# Patient Record
Sex: Male | Born: 1965 | Race: White | Hispanic: No | Marital: Married | Smoking: Former smoker
Health system: Southern US, Community
[De-identification: ages and names within clinical notes are randomized; demographics above are authoritative.]

## PROBLEM LIST (undated history)

## (undated) DIAGNOSIS — E119 Type 2 diabetes mellitus without complications: Secondary | ICD-10-CM

## (undated) DIAGNOSIS — G4730 Sleep apnea, unspecified: Secondary | ICD-10-CM

## (undated) DIAGNOSIS — E7800 Pure hypercholesterolemia, unspecified: Secondary | ICD-10-CM

## (undated) DIAGNOSIS — I499 Cardiac arrhythmia, unspecified: Secondary | ICD-10-CM

## (undated) DIAGNOSIS — K219 Gastro-esophageal reflux disease without esophagitis: Secondary | ICD-10-CM

## (undated) DIAGNOSIS — E039 Hypothyroidism, unspecified: Secondary | ICD-10-CM

## (undated) DIAGNOSIS — F419 Anxiety disorder, unspecified: Secondary | ICD-10-CM

## (undated) DIAGNOSIS — Z87442 Personal history of urinary calculi: Secondary | ICD-10-CM

## (undated) HISTORY — PX: HERNIA REPAIR: SHX51

## (undated) HISTORY — PX: COLONOSCOPY: SHX174

---

## 2007-10-19 ENCOUNTER — Encounter: Payer: Self-pay | Admitting: Pulmonary Disease

## 2009-09-24 ENCOUNTER — Emergency Department (HOSPITAL_BASED_OUTPATIENT_CLINIC_OR_DEPARTMENT_OTHER): Admission: EM | Admit: 2009-09-24 | Discharge: 2009-09-24 | Payer: Self-pay | Admitting: Emergency Medicine

## 2009-09-24 ENCOUNTER — Ambulatory Visit: Payer: Self-pay | Admitting: Diagnostic Radiology

## 2010-01-02 ENCOUNTER — Ambulatory Visit: Payer: Self-pay | Admitting: Internal Medicine

## 2010-01-02 DIAGNOSIS — R5381 Other malaise: Secondary | ICD-10-CM | POA: Insufficient documentation

## 2010-01-02 DIAGNOSIS — R5383 Other fatigue: Secondary | ICD-10-CM

## 2010-01-08 ENCOUNTER — Telehealth (INDEPENDENT_AMBULATORY_CARE_PROVIDER_SITE_OTHER): Payer: Self-pay | Admitting: *Deleted

## 2010-01-08 LAB — CONVERTED CEMR LAB
Albumin: 3.9 g/dL (ref 3.5–5.2)
Alkaline Phosphatase: 66 units/L (ref 39–117)
BUN: 13 mg/dL (ref 6–23)
Basophils Relative: 0.4 % (ref 0.0–3.0)
Bilirubin, Direct: 0.1 mg/dL (ref 0.0–0.3)
Chloride: 106 meq/L (ref 96–112)
Creatinine, Ser: 1 mg/dL (ref 0.4–1.5)
Direct LDL: 95.1 mg/dL
Hemoglobin: 15.6 g/dL (ref 13.0–17.0)
Lymphocytes Relative: 29.7 % (ref 12.0–46.0)
Neutrophils Relative %: 59.1 % (ref 43.0–77.0)
Platelets: 175 10*3/uL (ref 150.0–400.0)
Sodium: 140 meq/L (ref 135–145)
TSH: 0.95 microintl units/mL (ref 0.35–5.50)
Total Bilirubin: 0.7 mg/dL (ref 0.3–1.2)
Total CHOL/HDL Ratio: 6
Total Protein: 6.8 g/dL (ref 6.0–8.3)
Triglycerides: 298 mg/dL — ABNORMAL HIGH (ref 0.0–149.0)
VLDL: 59.6 mg/dL — ABNORMAL HIGH (ref 0.0–40.0)

## 2010-02-06 ENCOUNTER — Ambulatory Visit: Payer: Self-pay | Admitting: Pulmonary Disease

## 2010-02-06 DIAGNOSIS — R519 Headache, unspecified: Secondary | ICD-10-CM | POA: Insufficient documentation

## 2010-02-06 DIAGNOSIS — G4733 Obstructive sleep apnea (adult) (pediatric): Secondary | ICD-10-CM | POA: Insufficient documentation

## 2010-02-06 DIAGNOSIS — R51 Headache: Secondary | ICD-10-CM | POA: Insufficient documentation

## 2010-02-17 ENCOUNTER — Telehealth (INDEPENDENT_AMBULATORY_CARE_PROVIDER_SITE_OTHER): Payer: Self-pay | Admitting: *Deleted

## 2010-03-03 ENCOUNTER — Telehealth: Payer: Self-pay | Admitting: Pulmonary Disease

## 2010-03-05 ENCOUNTER — Encounter: Payer: Self-pay | Admitting: Pulmonary Disease

## 2010-04-13 ENCOUNTER — Telehealth: Payer: Self-pay | Admitting: Pulmonary Disease

## 2010-07-14 NOTE — Progress Notes (Signed)
Summary: FYI re: sleep study  Phone Note Call from Patient   Caller: Spouse Call For: clance Summary of Call: FYI: info requested by kc. pt had sleep study at [facility] [phone]. pt's spouse jody santos cell # [phone] (caller also left a msg re her father- Sande Rives DOB 02/04/46) Initial call taken by: Tivis Ringer, CNA,  February 17, 2010 11:37 AM  Follow-up for Phone Call        Spoke with pt's spouse and notified that we have faxed records release that pt signed over to [facility].  Spouse verbalized understanding. Follow-up by: Vernie Murders,  February 17, 2010 2:17 PM

## 2010-07-14 NOTE — Progress Notes (Signed)
Summary: Request for labs results  Phone Note Call from Patient Call back at Home Phone [phone] Call back at Work Phone [phone]   Caller: Patient Summary of Call: patient called to check on results of his lab work---please call him as soon as possible Initial call taken by: Jerolyn Shin,  January 08, 2010 12:20 PM  Follow-up for Phone Call        I left message on cell VM (got number from reg screen) informing patient labs not signed off on yet, I will call when labs addressed.  I called patient on home number and left message with male to have patient return call when avaliable Chrae Marlynn Perking CMA,  January 08, 2010 2:10 PM   patient called back - exlpained dr Claris Gladden reviewed lab yet - wil lcall patient when reviewed .Marland KitchenOkey Regal [place]  January 08, 2010 3:32 PM   Additional Follow-up for Phone Call Additional follow up Details #1::        see Appendum Additional Follow-up by: Marga Melnick MD,  January 08, 2010 5:34 PM    Additional Follow-up for Phone Call Additional follow up Details #2::    Left message on machine with results below (Cell Number-see reg comment)  All labs are good EXCEPT Triglycerides (TG)  goal = < 150. TG > 150 is a pre Diabetes pattern. Read ALL food & drink labels looking for grams of sugar from High Fructose Corn Syrup (HFCS)  &  as #1,2 or 33 on label. Consume LESS THAN 40 grams of HFCS sugar / day . The body converts HFCS into TG & stores it as fat inside belly & in liver.  as TG rise the HDL or GOOD cholesterol goes down & the VLDL (BAD ) cholesterol rises.Check fasting lipids after 3-4 months of restricting HFCS sugar.(Code: 272.2). Hopp  Patient to call if questions or concerns./Chrae [facility] CMA  January 08, 2010 5:36 PM

## 2010-07-14 NOTE — Assessment & Plan Note (Signed)
Summary: new to est/cpx//kn   Vital Signs:  Patient profile:   45 year old male Height:      71.25 inches Weight:      254.6 pounds BMI:     35.39 Temp:     97.7 degrees F oral Pulse rate:   88 / minute Resp:     16 per minute BP sitting:   122 / 84  (left arm) Cuff size:   large  Vitals Entered By: Shonna Chock CMA (January 02, 2010 11:54 AM) CC: New patient establish: CPX with fasting labs , Fatigue   CC:  New patient establish: CPX with fasting labs  and Fatigue.  History of Present Illness: Stephen Franklin is here for a physical; he questions low testosterone due to fatigue for 12 months..  The patient reports persistent fatigue, fatigue  even without physical exertion,  primarily physical fatigue.  The patient denies fever, night sweats, weight loss, exertional chest pain, dyspnea, cough, and hemoptysis.  Other symptoms include occasional  leg swelling,  intermittent cervical adenopathy, some  snoring, and  occasional daytime sleepiness. Sleep Study in 2007 in [place]; CPAP not used as equipment needs updated &  mask is uncomfortable. The patient denies the following symptoms: orthopnea, PND, melena, and skin changes.  The patient denies feeling depressed, altered appetite, and poor sleep  even off CPAP.    Preventive Screening-Counseling & Management  Alcohol-Tobacco     Smoking Status: current  Caffeine-Diet-Exercise     Does Patient Exercise: no  Current Medications (verified): 1)  None  Allergies (verified): No Known Drug Allergies  Past History:  Past Medical History: Sleep Apnea  Past Surgical History: Inguinal herniorrhaphy  Family History: Father: HTN, CM,lung cancer Mother: DM Siblings: bro: fibromyalgia, died post aspiration in context of accidental OD  Social History: Occupation: Teacher, English as a foreign language Single Current Smoker:3 cig / day Alcohol use-yes: beer socially Regular exercise-no Smoking Status:  current Does Patient Exercise:  no  Review of  Systems General:  Denies chills and sweats. Eyes:  Denies blurring, double vision, and vision loss-both eyes. ENT:  Denies difficulty swallowing and hoarseness. Resp:  Denies cough and sputum productive; Occasional am headache. GI:  Complains of indigestion. GU:  Denies decreased libido, discharge, erectile dysfunction, and hematuria. MS:  Denies joint pain, low back pain, mid back pain, muscle weakness, and thoracic pain. Derm:  Denies changes in nail beds, dryness, and hair loss. Neuro:  Complains of numbness and tingling; N&T R hand. Endo:  Denies cold intolerance, excessive hunger, excessive thirst, excessive urination, and heat intolerance. Heme:  Denies abnormal bruising and bleeding. Allergy:  Denies itching eyes and sneezing.  Physical Exam  General:  in no acute distress; alert,appropriate and cooperative throughout examination Head:  Normocephalic and atraumatic without obvious abnormalities. No apparent alopecia ; beard & moustache Eyes:  No corneal or conjunctival inflammation noted.  Perrla. Funduscopic exam benign, without hemorrhages, exudates or papilledema.  Ears:  External ear exam shows no significant lesions or deformities.  Otoscopic examination reveals clear canals, tympanic membranes are intact bilaterally without bulging, retraction, inflammation or discharge. Hearing is grossly normal bilaterally. Nose:  External nasal examination shows no deformity or inflammation. Nasal mucosa are pink and moist without lesions or exudates. Mouth:  Oral mucosa and oropharynx without lesions or exudates.  Teeth in good repair. Neck:  No deformities, masses, or tenderness noted. Lungs:  Normal respiratory effort, chest expands symmetrically. Lungs are clear to auscultation, no crackles or wheezes. Heart:  Normal rate and  regular rhythm. S1 and S2 normal without gallop, murmur, click, rub . S4  Abdomen:  Bowel sounds positive,abdomen soft and non-tender without masses, organomegaly or  hernias noted. Rectal:  No external abnormalities noted. Normal sphincter tone. No rectal masses or tenderness. Genitalia:  Testes bilaterally descended without nodularity, tenderness or masses. No scrotal masses or lesions. No penis lesions or urethral discharge. L varicocele.   Prostate:  Prostate gland firm and smooth, no enlargement, nodularity, tenderness, mass, asymmetry or induration. Msk:  No deformity or scoliosis noted of thoracic or lumbar spine.   Pulses:  R and L carotid,radial,dorsalis pedis and posterior tibial pulses are full and equal bilaterally Extremities:  No clubbing, cyanosis, edema, or deformity noted with normal full range of motion of all joints.   Neurologic:  alert & oriented X3 and DTRs symmetrical and normal.  + Tinel's R wrist Skin:  Intact without suspicious lesions or rashes. 2 tatooes Cervical Nodes:  No lymphadenopathy noted Axillary Nodes:  No palpable lymphadenopathy Inguinal Nodes:  No significant adenopathy Psych:  memory intact for recent and remote, normally interactive, and good eye contact.     Impression & Recommendations:  Problem # 1:  ROUTINE GENERAL MEDICAL EXAM@[email or web address]  CARE FACL (ICD-V70.0)  Orders: EKG w/ Interpretation (93000) Venipuncture (40981) TLB-Lipid Panel (80061-LIPID) TLB-BMP (Basic Metabolic Panel-BMET) (80048-METABOL) TLB-CBC Platelet - w/Differential (85025-CBCD) TLB-Hepatic/Liver Function Pnl (80076-HEPATIC) TLB-TSH (Thyroid Stimulating Hormone) (84443-TSH) Sleep Disorder Referral (Sleep Disorder)  Problem # 2:  FATIGUE (ICD-780.79)  Problem # 3:  SLEEP APNEA (ICD-780.57)  Orders: Sleep Disorder Referral (Sleep Disorder)  Patient Instructions: 1)  Take CPAP equipment to Sleep Specialist appt   Immunization History:  Tetanus/Td Immunization History:    Tetanus/Td:  historical (06/14/2006)   Appended Document: new to est/cpx//kn    Clinical Lists Changes  Orders: Added new Service order of Specimen  Handling (19147) - Signed

## 2010-07-14 NOTE — Assessment & Plan Note (Signed)
Summary: self referral for management of osa   Copy to:  self Primary Stephen Franklin/Referring Stephen Franklin:  Stephen Melnick MD  CC:  Sleep Consult.  History of Present Illness: The pt is a 45y/o male who comes in today as a self referral for management of osa.  He apparently had a sleep study in [place] in 2007, and told he had moderate osa.  He was treated with cpap and felt it helped, but could not keep the device due to lack of insurance.  He used for approx one year before turning it back in.  The pt has gained 35 pounds since his initial study in 2007, and is continuing to have loud snoring and an abnormal breathing pattern during sleep.  He goes to bed around 10pm, and arises at 6am to start his day.  He frequently has am headaches, and is not rested upon arising.  He is very busy at his work, and feels he does not sit still long enough to experience sleepiness.  He can doze at lunch however.  He will fall asleep in the evenings with tv or movies, but denies any sleepiness with driving.  His epworth score today is 11.  Current Medications (verified): 1)  No Prescription Medications  Allergies (verified): No Known Drug Allergies  Past History:  Past Medical History:  HEADACHE, CHRONIC (ICD-784.0) OBSTRUCTIVE SLEEP APNEA (ICD-327.23) FATIGUE (ICD-780.79)    Past Surgical History: Inguinal hernia repair as a child  Family History: Reviewed history from 01/02/2010 and no changes required. Father: HTN, CM,lung cancer Mother: DM Siblings: bro: fibromyalgia, died post aspiration in context of accidental OD  Social History: Reviewed history from 01/02/2010 and no changes required. Occupation: Engineer, manufacturing Current Smoker: started at age 74.  1/2 pack per week Alcohol use-yes: beer socially Regular exercise-no  Review of Systems       The patient complains of indigestion, weight change, headaches, nasal congestion/difficulty breathing through nose, and anxiety.  The  patient denies shortness of breath with activity, shortness of breath at rest, productive cough, non-productive cough, coughing up blood, chest pain, irregular heartbeats, acid heartburn, loss of appetite, abdominal pain, difficulty swallowing, sore throat, tooth/dental problems, sneezing, itching, ear ache, depression, hand/feet swelling, joint stiffness or pain, rash, change in color of mucus, and fever.    Vital Signs:  Patient profile:   45 year old male Height:      71.25 inches Weight:      256.25 pounds BMI:     35.62 O2 Sat:      99 % on Room air Temp:     97.6 degrees F oral Pulse rate:   87 / minute BP sitting:   120 / 80  (left arm) Cuff size:   large  Vitals Entered By: Arman Filter LPN (February 06, 2010 9:35 AM)  O2 Flow:  Room air CC: Sleep Consult Comments Medications reviewed with patient Arman Filter LPN  February 06, 2010 9:35 AM    Physical Exam  General:  obese male in nad Eyes:  PERRLA and EOMI.   Nose:  narrowed bilat, but deviated septum to left Mouth:  moderate elongation of soft palate and uvula Neck:  no jvd, tmg, LN Lungs:  clear to auscultation Heart:  rrr, no mrg Abdomen:  soft and nontender, bs+ Extremities:  no edema noted, pulses intact distally no cyanosis  Neurologic:  alert, but somewhat sleepy, moves all 4.   Impression & Recommendations:  Problem # 1:  OBSTRUCTIVE SLEEP APNEA (ICD-327.23)  the pt has a h/o osa diagnosed in the past, and is having significant symptoms currently which are interferring with his QOL.  We will need to get a copy of his sleep study, and start him back on cpap.  He has responded well to this in the past, and I have also encouraged him to work aggressively on weight loss.  I have had a long discussion with the pt about sleep apnea, including its impact on QOL and CV health.    Medications Added to Medication List This Visit: 1)  No Prescription Medications   Other Orders: New Patient Level IV  (16109)  Patient Instructions: 1)  will start on cpap once your sleep study results are available.  Let us know if you are able to locate, and we will try as well. 2)  work on weight loss 3)  followup with me in 5 weeks after starting cpap.  Appended Document: self referral for management of osa megan, have you seen this pt's sleep study from [facility]??  Appended Document: self referral for management of osa see phone note from 02/17/2010.  I was waiting for him to call me back with the name of the doc or place he had sleep study done at because at the time of his visit, pt didn't know. I faxed over a release of info to the place he had sleep study done at and now just waiting for results.

## 2010-07-14 NOTE — Progress Notes (Signed)
Summary: new cpap req -   Phone Note Call from Patient   Caller: fiance jodi Call For: Ramello Cordial Summary of Call: pt needs a new cpap. what needs to be done now to "expedite this so pt can get some sleep? jodi # [phone] Initial call taken by: Tivis Ringer, CNA,  March 03, 2010 10:52 AM  Follow-up for Phone Call        we still have not seen sleep study on pt from [facility] in [place].  Therefore called [facility] again at [phone] and spoke with Morrie Sheldon and requested sleep study be faxed to [place] asap.  Waiting on fax.  Aundra Millet Reynolds LPN  March 03, 2010 11:06 AM   [place] [facility] or [facility], have you seen the sleep study?  if not, i will call again. Follow-up by: Boone Master CNA/MA,  March 04, 2010 12:00 PM  Additional Follow-up for Phone Call Additional follow up Details #1::        No we still have not gotten it yet. It is no tin any of our paperwork Carver Fila  March 04, 2010 12:13 PM   called [facility] and spoke with [place].  requested sleep study again STAT as this is our second request.  gave her the triage fax #.  will await fax  Boone Master CNA/MA  March 04, 2010 12:38 PM     Additional Follow-up for Phone Call Additional follow up Details #2::    sleep study received and handed to Mindy.  called spoke with Lennox Laity, informed her that we had been waiting on the sleep study for [facility]; that it is now received.  Lennox Laity verbalized her understanding. Boone Master CNA/MA  March 04, 2010 12:42 PM    I placed the sleep study in kc's very important look at folder for review. Carver Fila  March 04, 2010 1:25 PM   Additional Follow-up for Phone Call Additional follow up Details #3:: Details for Additional Follow-up Action Taken: pt had split night study in 2009 with moderate osa (ahi 22/hr).  will send an order to [facility] to set pt up with cpap.  needs to precert. since has been 2 yrs. Additional Follow-up by: Barbaraann Share MD,  March 04, 2010 5:35 PM  pt aware. Carron Curie CMA  March 05, 2010 9:49 AM

## 2010-07-14 NOTE — Progress Notes (Signed)
Summary: nos appt  Phone Note Call from Patient   Caller: [email or web address]  Call For: Angeni Chaudhuri Summary of Call: ATC pt to [facility] nos from 10/28 phone disconnected. Initial call taken by: Darletta Moll,  April 13, 2010 3:30 PM

## 2010-07-14 NOTE — Miscellaneous (Signed)
Summary: Order / [facility]  Order / [facility]   Imported By: Lennie Odor 03/12/2010 14:48:12  _____________________________________________________________________  External Attachment:    Type:   Image     Comment:   External Document

## 2010-09-02 LAB — URINALYSIS, ROUTINE W REFLEX MICROSCOPIC
Glucose, UA: NEGATIVE mg/dL
Ketones, ur: NEGATIVE mg/dL
Protein, ur: NEGATIVE mg/dL
Specific Gravity, Urine: 1.012 (ref 1.005–1.030)
pH: 7.5 (ref 5.0–8.0)

## 2010-09-02 LAB — POCT CARDIAC MARKERS
Myoglobin, poc: 62.4 ng/mL (ref 12–200)
Troponin i, poc: <0.05 ng/mL (ref 0.00–0.09)
Troponin i, poc: <0.05 ng/mL (ref 0.00–0.09)

## 2010-09-02 LAB — CBC
Hemoglobin: 15.7 g/dL (ref 13.0–17.0)
MCHC: 33.4 g/dL (ref 30.0–36.0)
WBC: 6.8 10*3/uL (ref 4.0–10.5)

## 2010-09-02 LAB — HEMOGLOBIN A1C: Hgb A1c MFr Bld: 5.9 % — ABNORMAL HIGH (ref <5.7)

## 2010-09-02 LAB — COMPREHENSIVE METABOLIC PANEL
Calcium: 8.8 mg/dL (ref 8.4–10.5)
Chloride: 106 mEq/L (ref 96–112)
Creatinine, Ser: 1 mg/dL (ref 0.4–1.5)
GFR calc non Af Amer: >60 mL/min (ref >60)
Glucose, Bld: 93 mg/dL (ref 70–99)
Potassium: 4.3 mEq/L (ref 3.5–5.1)
Sodium: 141 mEq/L (ref 135–145)
Total Protein: 7 g/dL (ref 6.0–8.3)

## 2010-09-02 LAB — GLUCOSE, CAPILLARY: Glucose-Capillary: 87 mg/dL (ref 70–99)

## 2010-09-02 LAB — DIFFERENTIAL
Basophils Absolute: 0 10*3/uL (ref 0.0–0.1)
Eosinophils Absolute: 0.1 10*3/uL (ref 0.0–0.7)
Monocytes Relative: 11 % (ref 3–12)

## 2010-11-30 ENCOUNTER — Emergency Department (HOSPITAL_BASED_OUTPATIENT_CLINIC_OR_DEPARTMENT_OTHER)
Admission: EM | Admit: 2010-11-30 | Discharge: 2010-11-30 | Disposition: A | Discharge status: Home / Self Care | Payer: Self-pay | Attending: Emergency Medicine | Admitting: Emergency Medicine

## 2010-11-30 DIAGNOSIS — R002 Palpitations: Secondary | ICD-10-CM | POA: Insufficient documentation

## 2010-11-30 LAB — CBC
HCT: 42 % (ref 39.0–52.0)
Hemoglobin: 14.9 g/dL (ref 13.0–17.0)
MCH: 32.7 pg (ref 26.0–34.0)
MCHC: 35.5 g/dL (ref 30.0–36.0)
MCV: 92.1 fL (ref 78.0–100.0)
WBC: 6.4 10*3/uL (ref 4.0–10.5)

## 2010-11-30 LAB — CK TOTAL AND CKMB (NOT AT ARMC)
Relative Index: 1.3 (ref 0.0–2.5)
Total CK: 202 U/L (ref 7–232)

## 2010-11-30 LAB — COMPREHENSIVE METABOLIC PANEL
Albumin: 3.8 g/dL (ref 3.5–5.2)
BUN: 11 mg/dL (ref 6–23)
GFR calc Af Amer: >60 mL/min (ref >60)
GFR calc non Af Amer: >60 mL/min (ref >60)
Total Bilirubin: 0.2 mg/dL — ABNORMAL LOW (ref 0.3–1.2)

## 2010-11-30 LAB — URINALYSIS, ROUTINE W REFLEX MICROSCOPIC
Glucose, UA: NEGATIVE mg/dL
Ketones, ur: NEGATIVE mg/dL
Protein, ur: NEGATIVE mg/dL
pH: 6 (ref 5.0–8.0)

## 2010-11-30 LAB — TROPONIN I: Troponin I: <0.3 ng/mL (ref <0.30)

## 2010-11-30 LAB — DIFFERENTIAL
Basophils Relative: 0 % (ref 0–1)
Eosinophils Relative: 1 % (ref 0–5)
Lymphs Abs: 1.4 10*3/uL (ref 0.7–4.0)
Monocytes Absolute: 0.5 10*3/uL (ref 0.1–1.0)
Neutro Abs: 4.5 10*3/uL (ref 1.7–7.7)

## 2010-12-11 ENCOUNTER — Emergency Department (HOSPITAL_BASED_OUTPATIENT_CLINIC_OR_DEPARTMENT_OTHER)
Admission: EM | Admit: 2010-12-11 | Discharge: 2010-12-11 | Disposition: A | Discharge status: Home / Self Care | Payer: Self-pay | Attending: Emergency Medicine | Admitting: Emergency Medicine

## 2010-12-11 ENCOUNTER — Emergency Department (INDEPENDENT_AMBULATORY_CARE_PROVIDER_SITE_OTHER): Payer: Self-pay

## 2010-12-11 DIAGNOSIS — N200 Calculus of kidney: Secondary | ICD-10-CM

## 2010-12-11 DIAGNOSIS — R109 Unspecified abdominal pain: Secondary | ICD-10-CM | POA: Insufficient documentation

## 2010-12-11 DIAGNOSIS — R160 Hepatomegaly, not elsewhere classified: Secondary | ICD-10-CM | POA: Insufficient documentation

## 2010-12-11 DIAGNOSIS — F17200 Nicotine dependence, unspecified, uncomplicated: Secondary | ICD-10-CM | POA: Insufficient documentation

## 2010-12-11 LAB — DIFFERENTIAL
Basophils Absolute: 0 10*3/uL (ref 0.0–0.1)
Basophils Relative: 0 % (ref 0–1)
Eosinophils Absolute: 0.1 10*3/uL (ref 0.0–0.7)
Lymphocytes Relative: 25 % (ref 12–46)
Lymphs Abs: 1.4 10*3/uL (ref 0.7–4.0)
Monocytes Absolute: 0.6 10*3/uL (ref 0.1–1.0)
Neutrophils Relative %: 63 % (ref 43–77)

## 2010-12-11 LAB — COMPREHENSIVE METABOLIC PANEL
ALT: 74 U/L — ABNORMAL HIGH (ref 0–53)
AST: 36 U/L (ref 0–37)
Albumin: 3.8 g/dL (ref 3.5–5.2)
Alkaline Phosphatase: 84 U/L (ref 39–117)
Calcium: 9.5 mg/dL (ref 8.4–10.5)
Chloride: 103 mEq/L (ref 96–112)
GFR calc Af Amer: >60 mL/min (ref >60)
GFR calc non Af Amer: >60 mL/min (ref >60)
Total Bilirubin: 0.3 mg/dL (ref 0.3–1.2)
Total Protein: 6.9 g/dL (ref 6.0–8.3)

## 2010-12-11 LAB — CBC
HCT: 43.8 % (ref 39.0–52.0)
MCHC: 34.9 g/dL (ref 30.0–36.0)
Platelets: 161 10*3/uL (ref 150–400)
RBC: 4.74 MIL/uL (ref 4.22–5.81)
WBC: 5.8 10*3/uL (ref 4.0–10.5)

## 2010-12-11 LAB — URINALYSIS, ROUTINE W REFLEX MICROSCOPIC
Bilirubin Urine: NEGATIVE
Ketones, ur: NEGATIVE mg/dL
Specific Gravity, Urine: 1.008 (ref 1.005–1.030)
pH: 7 (ref 5.0–8.0)

## 2011-05-04 ENCOUNTER — Emergency Department (INDEPENDENT_AMBULATORY_CARE_PROVIDER_SITE_OTHER): Payer: Medicaid Other

## 2011-05-04 ENCOUNTER — Emergency Department (HOSPITAL_BASED_OUTPATIENT_CLINIC_OR_DEPARTMENT_OTHER)
Admission: EM | Admit: 2011-05-04 | Discharge: 2011-05-04 | Disposition: A | Discharge status: Home / Self Care | Payer: Medicaid Other | Attending: Emergency Medicine | Admitting: Emergency Medicine

## 2011-05-04 ENCOUNTER — Encounter: Payer: Self-pay | Admitting: *Deleted

## 2011-05-04 DIAGNOSIS — R1011 Right upper quadrant pain: Secondary | ICD-10-CM | POA: Insufficient documentation

## 2011-05-04 DIAGNOSIS — R195 Other fecal abnormalities: Secondary | ICD-10-CM

## 2011-05-04 DIAGNOSIS — K921 Melena: Secondary | ICD-10-CM

## 2011-05-04 LAB — URINE MICROSCOPIC-ADD ON

## 2011-05-04 LAB — COMPREHENSIVE METABOLIC PANEL
AST: 29 U/L (ref 0–37)
Albumin: 3.8 g/dL (ref 3.5–5.2)
Alkaline Phosphatase: 76 U/L (ref 39–117)
CO2: 21 mEq/L (ref 19–32)
Chloride: 103 mEq/L (ref 96–112)
Creatinine, Ser: 0.9 mg/dL (ref 0.50–1.35)
GFR calc non Af Amer: >90 mL/min (ref >90)
Potassium: 3.5 mEq/L (ref 3.5–5.1)
Total Bilirubin: 0.3 mg/dL (ref 0.3–1.2)

## 2011-05-04 LAB — URINALYSIS, ROUTINE W REFLEX MICROSCOPIC
Bilirubin Urine: NEGATIVE
Glucose, UA: NEGATIVE mg/dL
Ketones, ur: NEGATIVE mg/dL
Leukocytes, UA: NEGATIVE
Nitrite: NEGATIVE
Specific Gravity, Urine: 1.022 (ref 1.005–1.030)
pH: 6 (ref 5.0–8.0)

## 2011-05-04 LAB — CBC
HCT: 42 % (ref 39.0–52.0)
MCV: 92.3 fL (ref 78.0–100.0)
Platelets: 194 10*3/uL (ref 150–400)
RBC: 4.55 MIL/uL (ref 4.22–5.81)
RDW: 12.6 % (ref 11.5–15.5)
WBC: 8 10*3/uL (ref 4.0–10.5)

## 2011-05-04 MED ORDER — HYDROMORPHONE HCL PF 1 MG/ML IJ SOLN
1.0000 mg | Freq: Once | INTRAMUSCULAR | Status: AC
  Administered 2011-05-04: 1 mg via INTRAVENOUS
  Filled 2011-05-04: qty 1

## 2011-05-04 MED ORDER — PROMETHAZINE HCL 12.5 MG PO TABS: 25.0000 mg | ORAL_TABLET | Freq: Four times a day (QID) | ORAL | Status: AC | PRN

## 2011-05-04 MED ORDER — OXYCODONE HCL 5 MG PO TABS: 5.0000 mg | ORAL_TABLET | ORAL | Status: AC | PRN

## 2011-05-04 NOTE — ED Notes (Signed)
Pt c/o right lower abd pain x 2 day with looses  Stools.

## 2011-05-04 NOTE — ED Provider Notes (Signed)
History     CSN: [id number] Arrival date & time: 05/04/2011  6:00 PM   First MD Initiated Contact with Patient 05/04/11 1802      Chief Complaint  Patient presents with  . Abdominal Pain    (Consider location/radiation/quality/duration/timing/severity/associated sxs/prior treatment) HPI Patient reports right-sided and right upper abdominal pain x2 days is more constant in nature now with associated diarrhea and loose stools.  He denies nausea denies vomiting.  He reports some pain with movement and palpation.  Denies fever and chills.  Similar symptoms approximately 6 months ago essentially with a binge drinking episode.  At that time he was noted to have elevated liver function tests and he has stopped drinking.  No prior history of pancreatitis.  Nothing worsens the symptoms.  Nothing improves his symptoms.  His symptoms are moderate to severe.  They're constant in nature.  She's never had an ultrasound.  He does report a recent CT scan in approximately 5 months ago demonstrating hepatic steatosis.   History reviewed. No pertinent past medical history.  Past Surgical History  Procedure Date  . Hernia repair     History reviewed. No pertinent family history.  History  Substance Use Topics  . Smoking status: Never Smoker   . Smokeless tobacco: Not on file  . Alcohol Use: No      Review of Systems  All other systems reviewed and are negative.    Allergies  Review of patient's allergies indicates no known allergies.  Home Medications   Current Outpatient Rx  Name Route Sig Dispense Refill  . CALCIUM CARBONATE ANTACID 1177 MG PO CHEW Oral Chew 1 tablet by mouth once.        BP 137/91  Pulse 78  Temp 97.9 F (36.6 C)  Resp 18  Ht 6' (1.829 m)  Wt 250 lb (113.399 kg)  BMI 33.91 kg/m2  SpO2 100%  Physical Exam  Nursing note and vitals reviewed. Constitutional: He is oriented to person, place, and time. He appears well-developed and well-nourished.  HENT:    Head: Normocephalic and atraumatic.  Eyes: EOM are normal.  Neck: Normal range of motion.  Cardiovascular: Normal rate, regular rhythm, normal heart sounds and intact distal pulses.   Pulmonary/Chest: Effort normal and breath sounds normal. No respiratory distress.  Abdominal: Soft. He exhibits no distension.       Mild right upper abdominal pain without guarding or rebound.  No frank Murphy sign and exam.  No CVA tenderness bilaterally  Musculoskeletal: Normal range of motion.  Neurological: He is alert and oriented to person, place, and time.  Skin: Skin is warm and dry.  Psychiatric: He has a normal mood and affect. Judgment normal.    ED Course  Procedures (including critical care time)  Labs Reviewed  URINALYSIS, ROUTINE W REFLEX MICROSCOPIC - Abnormal; Notable for the following:    Protein, ur 30 (*)    All other components within normal limits  COMPREHENSIVE METABOLIC PANEL - Abnormal; Notable for the following:    Glucose, Bld 101 (*)    ALT 58 (*)    All other components within normal limits  URINE MICROSCOPIC-ADD ON  CBC  LIPASE, BLOOD   US Abdomen Complete  05/04/2011  *RADIOLOGY REPORT*  Clinical Data:  Right upper quadrant abdominal pain for 2 days with loose stools.  History of hernia repair, renal cyst and kidney stone.  COMPLETE ABDOMINAL ULTRASOUND  Comparison:  Abdominal CT 12/11/2010.  Findings:  Study is mildly limited by a prominent  bowel gas.  Gallbladder: Well distended without wall thickening, stones or pericholecystic fluid. Negative sonographic Murphy's sign.  Common bile duct:   Normal in caliber without filling defects.  Liver:  The hepatic echogenicity is diffusely increased.  No focal liver lesions are identified.  The hepatic contours appear normal.  IVC:  Largely obscured by bowel gas.  Pancreas:  Visualized portions appear unremarkable.The pancreas is partly obscured by bowel gas.  Spleen:  Visualized portions appear unremarkable.  Right Kidney:   The  renal cortical thickness and echogenicity are preserved.  There is no hydronephrosis or focal abnormality. Renal length is 12.3 cm.  Left Kidney:   The renal cortical thickness and echogenicity are preserved.  There is no hydronephrosis or focal abnormality. Renal length is 12.4 cm.  Abdominal aorta:  Visualized portions appear unremarkable.  IMPRESSION:  1.  No acute abdominal findings identified.  The gallbladder appears unremarkable. 2.  Hepatic steatosis as correlated with recent CT. 3.  Visualization of the pancreas is limited.  Original Report Authenticated By: Gerrianne Scale, M.D.     1. Abdominal pain       MDM  Given the patient's right upper quadrant pain an ultrasound and labs were obtained.  Is a mild elevation in his ALT with a normal AST normal T. bili and normal alkaline phosphatase.  His lipase is normal.  His urine is clean.  His ultrasound demonstrates no acute pathology.  Given the patient's pain 6 months ago as well as his pain over the last 2 and half days this still could represent biliary dysfunction.  The patient will be given gastroenterology referral for possible high the scan.  My suspicion for an appendicitis is very low.  Lipase normal postop pancreatitis.  Pain is resolved at this time.  Patient has been instructed to return to the ER for development of fever worsening abdominal pain or severe nausea vomiting and inability to keep fluids down        Lyanne Co, MD 05/04/11 2036

## 2011-06-10 ENCOUNTER — Emergency Department (INDEPENDENT_AMBULATORY_CARE_PROVIDER_SITE_OTHER): Payer: Medicaid Other

## 2011-06-10 ENCOUNTER — Emergency Department (HOSPITAL_BASED_OUTPATIENT_CLINIC_OR_DEPARTMENT_OTHER)
Admission: EM | Admit: 2011-06-10 | Discharge: 2011-06-10 | Disposition: A | Discharge status: Home / Self Care | Payer: Medicaid Other | Attending: Emergency Medicine | Admitting: Emergency Medicine

## 2011-06-10 ENCOUNTER — Other Ambulatory Visit: Payer: Self-pay

## 2011-06-10 ENCOUNTER — Encounter (HOSPITAL_BASED_OUTPATIENT_CLINIC_OR_DEPARTMENT_OTHER): Payer: Self-pay | Admitting: *Deleted

## 2011-06-10 DIAGNOSIS — M25519 Pain in unspecified shoulder: Secondary | ICD-10-CM

## 2011-06-10 DIAGNOSIS — R0789 Other chest pain: Secondary | ICD-10-CM | POA: Insufficient documentation

## 2011-06-10 DIAGNOSIS — R079 Chest pain, unspecified: Secondary | ICD-10-CM

## 2011-06-10 LAB — COMPREHENSIVE METABOLIC PANEL
AST: 25 U/L (ref 0–37)
CO2: 26 mEq/L (ref 19–32)
Calcium: 9.4 mg/dL (ref 8.4–10.5)
Creatinine, Ser: 1 mg/dL (ref 0.50–1.35)
GFR calc non Af Amer: 88 mL/min — ABNORMAL LOW (ref >90)
Sodium: 138 mEq/L (ref 135–145)
Total Protein: 7.2 g/dL (ref 6.0–8.3)

## 2011-06-10 LAB — DIFFERENTIAL
Basophils Absolute: 0 10*3/uL (ref 0.0–0.1)
Basophils Relative: 0 % (ref 0–1)
Eosinophils Absolute: 0.1 10*3/uL (ref 0.0–0.7)
Eosinophils Relative: 1 % (ref 0–5)
Lymphocytes Relative: 27 % (ref 12–46)
Monocytes Absolute: 0.4 10*3/uL (ref 0.1–1.0)

## 2011-06-10 LAB — CARDIAC PANEL(CRET KIN+CKTOT+MB+TROPI)
CK, MB: 2.6 ng/mL (ref 0.3–4.0)
Relative Index: 1.9 (ref 0.0–2.5)
Troponin I: <0.3 ng/mL (ref <0.30)

## 2011-06-10 LAB — CBC
HCT: 46.2 % (ref 39.0–52.0)
MCH: 31.4 pg (ref 26.0–34.0)
MCHC: 34 g/dL (ref 30.0–36.0)
MCV: 92.4 fL (ref 78.0–100.0)
Platelets: 179 10*3/uL (ref 150–400)
RDW: 12.6 % (ref 11.5–15.5)

## 2011-06-10 MED ORDER — KETOROLAC TROMETHAMINE 30 MG/ML IJ SOLN
30.0000 mg | Freq: Once | INTRAMUSCULAR | Status: DC
  Filled 2011-06-10: qty 1

## 2011-06-10 NOTE — ED Provider Notes (Signed)
History     CSN: [id number]  Arrival date & time 06/10/11  1327   First MD Initiated Contact with Patient 06/10/11 1409      Chief Complaint  Patient presents with  . Shoulder Pain  . Chest Pain    (Consider location/radiation/quality/duration/timing/severity/associated sxs/prior treatment) HPI Comments: Started today with chest pain while driving.  States pain was sharp "like a knife" in the upper chest and into the left arm.  There was no diaphoresis, nausea, shortness of breath.  Denies recent exertional symptoms.  Heart cath at [facility] 3 years ago was normal.    Patient is a 45 y.o. male presenting with shoulder pain and chest pain. The history is provided by the patient.  Shoulder Pain This is a new problem. The current episode started 1 to 2 hours ago. The problem occurs constantly. The problem has been resolved. Associated symptoms include chest pain. The symptoms are aggravated by nothing. The symptoms are relieved by nothing. He has tried nothing for the symptoms.  Chest Pain     History reviewed. No pertinent past medical history.  Past Surgical History  Procedure Date  . Hernia repair     History reviewed. No pertinent family history.  History  Substance Use Topics  . Smoking status: Never Smoker   . Smokeless tobacco: Not on file  . Alcohol Use: No      Review of Systems  Cardiovascular: Positive for chest pain.  All other systems reviewed and are negative.    Allergies  Review of patient's allergies indicates no known allergies.  Home Medications   Current Outpatient Rx  Name Route Sig Dispense Refill  . CALCIUM CARBONATE ANTACID 1177 MG PO CHEW Oral Chew 1 tablet by mouth once.        BP 144/93  Pulse 99  Temp 98.4 F (36.9 C)  Resp 20  Ht 6' (1.829 m)  Wt 253 lb (114.76 kg)  BMI 34.31 kg/m2  SpO2 96%  Physical Exam  Nursing note and vitals reviewed. Constitutional: He is oriented to person, place, and time. He appears  well-developed and well-nourished. No distress.  HENT:  Head: Normocephalic and atraumatic.  Neck: Normal range of motion. Neck supple.  Cardiovascular: Normal rate and regular rhythm.  Exam reveals no friction rub.   No murmur heard. Pulmonary/Chest: Effort normal and breath sounds normal. No respiratory distress. He has no wheezes.  Abdominal: Soft. Bowel sounds are normal. He exhibits no distension. There is no tenderness.  Musculoskeletal: Normal range of motion. He exhibits no edema.  Neurological: He is alert and oriented to person, place, and time.  Skin: Skin is warm and dry. He is not diaphoretic.    ED Course  Procedures (including critical care time)  Labs Reviewed - No data to display No results found.   No diagnosis found.   Date: 06/10/2011  Rate: 91   Rhythm: normal sinus rhythm  QRS Axis: normal  Intervals: normal  ST/T Wave abnormalities: normal  Conduction Disutrbances:none  Narrative Interpretation:   Old EKG Reviewed: none available    MDM  Patient arrives with atypical chest pain.  The ekg, chest xray, and labs all look okay.  I believe the patient is stable for discharge.  I doubt this was cardiac in nature.  Will recommend ibuprofen, rest, return if symptoms worsen or change.        Geoffery Lyons, MD 06/10/11 [phone]

## 2011-06-10 NOTE — ED Notes (Signed)
Pt to room 6 amb with quick steady gait in nad. Pt reports he was driving and had a sudden onset of mid sternal chest pain "sharp" that radiated to his left shoulder and arm. Pt reports "some" associated sob. ekg and iv access obtained while pt being triaged.

## 2011-08-26 ENCOUNTER — Emergency Department (HOSPITAL_BASED_OUTPATIENT_CLINIC_OR_DEPARTMENT_OTHER)
Admission: EM | Admit: 2011-08-26 | Discharge: 2011-08-26 | Disposition: A | Discharge status: Home / Self Care | Payer: Medicaid Other | Attending: Emergency Medicine | Admitting: Emergency Medicine

## 2011-08-26 ENCOUNTER — Emergency Department (INDEPENDENT_AMBULATORY_CARE_PROVIDER_SITE_OTHER): Payer: Medicaid Other

## 2011-08-26 ENCOUNTER — Encounter (HOSPITAL_BASED_OUTPATIENT_CLINIC_OR_DEPARTMENT_OTHER): Payer: Self-pay | Admitting: Family Medicine

## 2011-08-26 DIAGNOSIS — R112 Nausea with vomiting, unspecified: Secondary | ICD-10-CM | POA: Insufficient documentation

## 2011-08-26 DIAGNOSIS — N200 Calculus of kidney: Secondary | ICD-10-CM | POA: Insufficient documentation

## 2011-08-26 DIAGNOSIS — K7689 Other specified diseases of liver: Secondary | ICD-10-CM

## 2011-08-26 DIAGNOSIS — R109 Unspecified abdominal pain: Secondary | ICD-10-CM | POA: Insufficient documentation

## 2011-08-26 DIAGNOSIS — N201 Calculus of ureter: Secondary | ICD-10-CM

## 2011-08-26 LAB — URINALYSIS, ROUTINE W REFLEX MICROSCOPIC
Bilirubin Urine: NEGATIVE
Glucose, UA: NEGATIVE mg/dL
Nitrite: NEGATIVE
Specific Gravity, Urine: 1.029 (ref 1.005–1.030)
pH: 5 (ref 5.0–8.0)

## 2011-08-26 LAB — DIFFERENTIAL
Basophils Relative: 0 % (ref 0–1)
Eosinophils Absolute: 0.1 10*3/uL (ref 0.0–0.7)
Lymphs Abs: 2.5 10*3/uL (ref 0.7–4.0)
Neutro Abs: 5.2 10*3/uL (ref 1.7–7.7)
Neutrophils Relative %: 60 % (ref 43–77)

## 2011-08-26 LAB — COMPREHENSIVE METABOLIC PANEL
ALT: 68 U/L — ABNORMAL HIGH (ref 0–53)
Albumin: 3.5 g/dL (ref 3.5–5.2)
Alkaline Phosphatase: 80 U/L (ref 39–117)
Potassium: 3.5 mEq/L (ref 3.5–5.1)
Sodium: 139 mEq/L (ref 135–145)
Total Protein: 7 g/dL (ref 6.0–8.3)

## 2011-08-26 LAB — CBC
MCH: 32 pg (ref 26.0–34.0)
MCHC: 35.1 g/dL (ref 30.0–36.0)
Platelets: 196 10*3/uL (ref 150–400)
RBC: 4.85 MIL/uL (ref 4.22–5.81)

## 2011-08-26 LAB — URINE MICROSCOPIC-ADD ON

## 2011-08-26 MED ORDER — TAMSULOSIN HCL 0.4 MG PO CAPS: 0.4000 mg | ORAL_CAPSULE | Freq: Every evening | ORAL | Status: DC | PRN

## 2011-08-26 MED ORDER — HYDROMORPHONE HCL PF 1 MG/ML IJ SOLN
INTRAMUSCULAR | Status: AC
  Filled 2011-08-26: qty 1

## 2011-08-26 MED ORDER — KETOROLAC TROMETHAMINE 30 MG/ML IJ SOLN
30.0000 mg | Freq: Once | INTRAMUSCULAR | Status: AC
  Administered 2011-08-26: 30 mg via INTRAVENOUS
  Filled 2011-08-26: qty 1

## 2011-08-26 MED ORDER — HYDROMORPHONE HCL 2 MG PO TABS: 2.0000 mg | ORAL_TABLET | ORAL | Status: AC | PRN

## 2011-08-26 MED ORDER — ONDANSETRON HCL 8 MG PO TABS: 8.0000 mg | ORAL_TABLET | Freq: Three times a day (TID) | ORAL | Status: AC | PRN

## 2011-08-26 MED ORDER — HYDROMORPHONE HCL PF 1 MG/ML IJ SOLN
1.0000 mg | Freq: Once | INTRAMUSCULAR | Status: AC
  Administered 2011-08-26: 1 mg via INTRAVENOUS
  Filled 2011-08-26: qty 1

## 2011-08-26 MED ORDER — HYDROMORPHONE HCL PF 1 MG/ML IJ SOLN: 1.0000 mg | Freq: Once | INTRAMUSCULAR | Status: DC

## 2011-08-26 MED ORDER — SODIUM CHLORIDE 0.9 % IV SOLN
INTRAVENOUS | Status: DC
  Administered 2011-08-26: 09:00:00 via INTRAVENOUS

## 2011-08-26 MED ORDER — ONDANSETRON HCL 4 MG/2ML IJ SOLN
INTRAMUSCULAR | Status: AC
  Administered 2011-08-26: 4 mg via INTRAVENOUS
  Filled 2011-08-26: qty 2

## 2011-08-26 MED ORDER — HYDROMORPHONE HCL PF 1 MG/ML IJ SOLN
1.0000 mg | Freq: Once | INTRAMUSCULAR | Status: AC
  Administered 2011-08-26: 1 mg via INTRAVENOUS

## 2011-08-26 MED ORDER — IBUPROFEN 800 MG PO TABS: 800.0000 mg | ORAL_TABLET | Freq: Three times a day (TID) | ORAL | Status: AC | PRN

## 2011-08-26 MED ORDER — ONDANSETRON HCL 4 MG/2ML IJ SOLN
4.0000 mg | Freq: Once | INTRAMUSCULAR | Status: DC
  Administered 2011-08-26: 4 mg via INTRAVENOUS

## 2011-08-26 MED ORDER — HYDROMORPHONE HCL PF 2 MG/ML IJ SOLN
INTRAMUSCULAR | Status: AC
  Filled 2011-08-26: qty 1

## 2011-08-26 NOTE — Discharge Instructions (Signed)
Kidney Stones Kidney stones (ureteral lithiasis) are deposits that form inside your kidneys. The intense pain is caused by the stone moving through the urinary tract. When the stone moves, the ureter goes into spasm around the stone. The stone is usually passed in the urine.  CAUSES   A disorder that makes certain neck glands produce too much parathyroid hormone (primary hyperparathyroidism).   A buildup of uric acid crystals.   Narrowing (stricture) of the ureter.   A kidney obstruction present at birth (congenital obstruction).   Previous surgery on the kidney or ureters.   Numerous kidney infections.  SYMPTOMS   Feeling sick to your stomach (nauseous).   Throwing up (vomiting).   Blood in the urine (hematuria).   Pain that usually spreads (radiates) to the groin.   Frequency or urgency of urination.  DIAGNOSIS   Taking a history and physical exam.   Blood or urine tests.   Computerized X-ray scan (CT scan).   Occasionally, an examination of the inside of the urinary bladder (cystoscopy) is performed.  TREATMENT   Observation.   Increasing your fluid intake.   Surgery may be needed if you have severe pain or persistent obstruction.  The size, location, and chemical composition are all important variables that will determine the proper choice of action for you. Talk to your caregiver to better understand your situation so that you will minimize the risk of injury to yourself and your kidney.  HOME CARE INSTRUCTIONS   Drink enough water and fluids to keep your urine clear or pale yellow.   Strain all urine through the provided strainer. Keep all particulate matter and stones for your caregiver to see. The stone causing the pain may be as small as a grain of salt. It is very important to use the strainer each and every time you pass your urine. The collection of your stone will allow your caregiver to analyze it and verify that a stone has actually passed.   Only take  over-the-counter or prescription medicines for pain, discomfort, or fever as directed by your caregiver.   Make a follow-up appointment with your caregiver as directed.   Get follow-up X-rays if required. The absence of pain does not always mean that the stone has passed. It may have only stopped moving. If the urine remains completely obstructed, it can cause loss of kidney function or even complete destruction of the kidney. It is your responsibility to make sure X-rays and follow-ups are completed. Ultrasounds of the kidney can show blockages and the status of the kidney. Ultrasounds are not associated with any radiation and can be performed easily in a matter of minutes.  SEEK IMMEDIATE MEDICAL CARE IF:   Pain cannot be controlled with the prescribed medicine.   You have a fever.   The severity or intensity of pain increases over 18 hours and is not relieved by pain medicine.   You develop a new onset of abdominal pain.   You feel faint or pass out.  MAKE SURE YOU:   Understand these instructions.   Will watch your condition.   Will get help right away if you are not doing well or get worse.  Document Released: 05/31/2005 Document Revised: 05/20/2011 Document Reviewed: 09/26/2009 [facility] [place] Patient Information 2012 [place], [place].Ureteral Colic (Kidney Stones) Ureteral colic is the result of a condition when kidney stones form inside the kidney. Once kidney stones are formed they may move into the tube that connects the kidney with the bladder (ureter). If  this occurs, this condition may cause pain (colic) in the ureter.  CAUSES  Pain is caused by stone movement in the ureter and the obstruction caused by the stone. SYMPTOMS  The pain comes and goes as the ureter contracts around the stone. The pain is usually intense, sharp, and stabbing in character. The location of the pain may move as the stone moves through the ureter. When the stone is near the kidney the pain is usually located  in the back and radiates to the belly (abdomen). When the stone is ready to pass into the bladder the pain is often located in the lower abdomen on the side the stone is located. At this location, the symptoms may mimic those of a urinary tract infection with urinary frequency. Once the stone is located here it often passes into the bladder and the pain disappears completely. TREATMENT   Your caregiver will provide you with medicine for pain relief.   You may require specialized follow-up X-rays.   The absence of pain does not always mean that the stone has passed. It may have just stopped moving. If the urine remains completely obstructed, it can cause loss of kidney function or even complete destruction of the involved kidney. It is your responsibility and in your interest that X-rays and follow-ups as suggested by your caregiver are completed. Relief of pain without passage of the stone can be associated with severe damage to the kidney, including loss of kidney function on that side.   If your stone does not pass on its own, additional measures may be taken by your caregiver to ensure its removal.  HOME CARE INSTRUCTIONS   Increase your fluid intake. Water is the preferred fluid since juices containing vitamin C may acidify the urine making it less likely for certain stones (uric acid stones) to pass.   Strain all urine. A strainer will be provided. Keep all particulate matter or stones for your caregiver to inspect.   Take your pain medicine as directed.   Make a follow-up appointment with your caregiver as directed.   Remember that the goal is passage of your stone. The absence of pain does not mean the stone is gone. Follow your caregiver's instructions.   Only take over-the-counter or prescription medicines for pain, discomfort, or fever as directed by your caregiver.  SEEK MEDICAL CARE IF:   Pain cannot be controlled with the prescribed medicine.   You have a fever.   Pain  continues for longer than your caregiver advises it should.   There is a change in the pain, and you develop chest discomfort or constant abdominal pain.   You feel faint or pass out.  MAKE SURE YOU:   Understand these instructions.   Will watch your condition.   Will get help right away if you are not doing well or get worse.  Document Released: 03/10/2005 Document Revised: 05/20/2011 Document Reviewed: 11/25/2010 [facility] Patient Information 2012 [place], [place].Urine Strainer This strainer is used to catch or filter out any stones found in your urine. Place the strainer under your urine stream. Save any stones or objects that you find in your urine. Place them in a plastic or glass container to show your caregiver. The stones vary in size - some can be very small, so make sure you check the strainer carefully. Your caregiver may send the stone to the lab. When the results are back, your caregiver may recommend medicines or diet changes.  Document Released: 03/05/2004 Document Revised: 05/20/2011  Document Reviewed: 04/12/2008 [facility] Patient Information 2012 [place], [place].

## 2011-08-26 NOTE — ED Notes (Signed)
Dr Doylene Canard in with patient.

## 2011-08-26 NOTE — ED Notes (Signed)
Pt requesting pain mediation. MD notified.

## 2011-08-26 NOTE — ED Notes (Signed)
Patient unable to void at this time, patient is receiving IV fluids.  Urinal placed within patients reach.

## 2011-08-26 NOTE — ED Notes (Signed)
Pt c/o severe right groin pain x 1 wk and worse this morning.

## 2011-08-26 NOTE — ED Notes (Signed)
Patient transported to CT 

## 2011-08-26 NOTE — ED Provider Notes (Signed)
History     CSN: [id number]  Arrival date & time 08/26/11  7829   First MD Initiated Contact with Patient 08/26/11 [phone]      Chief Complaint  Patient presents with  . Groin Pain    (Consider location/radiation/quality/duration/timing/severity/associated sxs/prior treatment) HPI Comments: The patient denies hematuria, dysuria, testicular pain, constipation, blood in the stool or vomit, fever or chills.  Patient is a 46 y.o. male presenting with groin pain. The history is provided by the patient.  Groin Pain This is a recurrent problem. Episode onset: About one week ago the patient reports that he had the onset of similar pain to what he presents with now but less severe, and for which she was evaluated by his primary care physician who diagnosed epididymitis and prescribed him an antibiotic  The problem occurs constantly (The patient reports that the pain had subsequently improved but not gone away completely after his visit to his PCP, but this morning he awoke with severe 10 out of 10 intensity pain at his right groin, occasionally at the right flank, with nausea and vom). The problem has not changed since onset.Pertinent negatives include no chest pain, no abdominal pain, no headaches and no shortness of breath. The symptoms are aggravated by nothing. The symptoms are relieved by nothing. Treatments tried: Percocet this morning. The treatment provided no relief.    History reviewed. No pertinent past medical history.  Past Surgical History  Procedure Date  . Hernia repair     No family history on file.  History  Substance Use Topics  . Smoking status: Never Smoker   . Smokeless tobacco: Not on file  . Alcohol Use: No      Review of Systems  Constitutional: Negative for fever, chills, activity change, appetite change and fatigue.  HENT: Negative.   Eyes: Negative.   Respiratory: Negative for cough, shortness of breath and wheezing.   Cardiovascular: Negative for chest  pain.  Gastrointestinal: Positive for nausea and vomiting. Negative for abdominal pain, diarrhea, constipation, blood in stool, abdominal distention, anal bleeding and rectal pain.  Genitourinary: Positive for flank pain. Negative for dysuria, urgency, frequency, hematuria, decreased urine volume, discharge, genital sores, penile pain and testicular pain.  Musculoskeletal: Negative.   Skin: Negative for color change and rash.  Neurological: Negative for light-headedness and headaches.  Hematological: Does not bruise/bleed easily.  Psychiatric/Behavioral: Negative.     Allergies  Review of patient's allergies indicates no known allergies.  Home Medications   Current Outpatient Rx  Name Route Sig Dispense Refill  . LEVOFLOXACIN PO Oral Take by mouth.    . OXYCODONE HCL PO Oral Take by mouth.    Marland Kitchen CALCIUM CARBONATE ANTACID 1177 MG PO CHEW Oral Chew 1 tablet by mouth once.        BP 151/95  Pulse 84  Temp(Src) 97.5 F (36.4 C) (Oral)  Resp 22  Ht 5\' 11"  (1.803 m)  Wt 265 lb (120.203 kg)  BMI 36.96 kg/m2  SpO2 100%  Physical Exam  Nursing note and vitals reviewed. Constitutional: He is oriented to person, place, and time. He appears well-nourished. He appears distressed.  HENT:  Head: Normocephalic and atraumatic.  Eyes: EOM are normal. Pupils are equal, round, and reactive to light.  Neck: Normal range of motion. Neck supple. No JVD present.  Cardiovascular: Normal rate, regular rhythm, normal heart sounds and intact distal pulses.  Exam reveals no gallop and no friction rub.   No murmur heard. Pulmonary/Chest: Effort normal and breath  sounds normal. No respiratory distress. He has no wheezes. He has no rales. He exhibits no tenderness.  Abdominal: Soft. Bowel sounds are normal. He exhibits no distension, no fluid wave, no ascites and no mass. There is no hepatosplenomegaly. There is no tenderness. There is no rebound, no guarding and no CVA tenderness. Hernia confirmed negative  in the right inguinal area and confirmed negative in the left inguinal area.  Genitourinary: Testes normal and penis normal. Cremasteric reflex is present. Right testis shows no mass, no swelling and no tenderness. Cremasteric reflex is not absent on the right side. Left testis shows no mass, no swelling and no tenderness. Cremasteric reflex is not absent on the left side. Circumcised. No penile erythema or penile tenderness. No discharge found.  Musculoskeletal: Normal range of motion. He exhibits no edema and no tenderness.  Lymphadenopathy:       Right: No inguinal adenopathy present.       Left: No inguinal adenopathy present.  Neurological: He is alert and oriented to person, place, and time. He has normal reflexes. No cranial nerve deficit. He exhibits normal muscle tone. Coordination normal.  Skin: Skin is warm and dry. No rash noted. He is not diaphoretic. No erythema. No pallor.  Psychiatric: He has a normal mood and affect. His behavior is normal. Judgment and thought content normal.    ED Course  Procedures (including critical care time)   Labs Reviewed  CBC  DIFFERENTIAL  COMPREHENSIVE METABOLIC PANEL  LIPASE, BLOOD  URINALYSIS, ROUTINE W REFLEX MICROSCOPIC  URINE CULTURE   No results found.   No diagnosis found.    MDM  The patient is in severe discomfort have ordered him IV analgesics as well as anti-medics. His symptoms are suggestive to me of possible kidney stone, with the pain being vague and not well localized at the groin, the testicle, the abdomen, or the flank but in describing the way between all the above. His testicles are nontender to palpation and there is no swelling, mass, or erythema and he is artery being treated for epididymitis. I do not suspect that the pain is testicular in origin based on the physical examination. He has no inguinal hernia on the right side where his pain is. He has no tenderness over his appendix or gallbladder. I think this is most  likely kidney stone we'll assess a urinalysis as well as a CT scan of the abdomen and pelvis. If the CT scan is negative for any etiology of his pain, I will pursue an ultrasound of the scrotum to further evaluate.        Felisa Bonier, MD 08/26/11 [phone]

## 2011-08-28 LAB — URINE CULTURE
Colony Count: NO GROWTH
Culture  Setup Time: 201303150048
Culture: NO GROWTH

## 2012-08-22 ENCOUNTER — Emergency Department (HOSPITAL_BASED_OUTPATIENT_CLINIC_OR_DEPARTMENT_OTHER)
Admission: EM | Admit: 2012-08-22 | Discharge: 2012-08-22 | Disposition: A | Discharge status: Home / Self Care | Payer: BC Managed Care – PPO | Attending: Emergency Medicine | Admitting: Emergency Medicine

## 2012-08-22 ENCOUNTER — Emergency Department (HOSPITAL_BASED_OUTPATIENT_CLINIC_OR_DEPARTMENT_OTHER): Payer: BC Managed Care – PPO

## 2012-08-22 ENCOUNTER — Other Ambulatory Visit: Payer: Self-pay

## 2012-08-22 ENCOUNTER — Encounter (HOSPITAL_BASED_OUTPATIENT_CLINIC_OR_DEPARTMENT_OTHER): Payer: Self-pay | Admitting: *Deleted

## 2012-08-22 DIAGNOSIS — F410 Panic disorder [episodic paroxysmal anxiety] without agoraphobia: Secondary | ICD-10-CM | POA: Insufficient documentation

## 2012-08-22 DIAGNOSIS — R109 Unspecified abdominal pain: Secondary | ICD-10-CM

## 2012-08-22 DIAGNOSIS — Z87891 Personal history of nicotine dependence: Secondary | ICD-10-CM | POA: Insufficient documentation

## 2012-08-22 DIAGNOSIS — Z862 Personal history of diseases of the blood and blood-forming organs and certain disorders involving the immune mechanism: Secondary | ICD-10-CM | POA: Insufficient documentation

## 2012-08-22 DIAGNOSIS — Z79899 Other long term (current) drug therapy: Secondary | ICD-10-CM | POA: Insufficient documentation

## 2012-08-22 DIAGNOSIS — R002 Palpitations: Secondary | ICD-10-CM | POA: Insufficient documentation

## 2012-08-22 DIAGNOSIS — R0602 Shortness of breath: Secondary | ICD-10-CM | POA: Insufficient documentation

## 2012-08-22 DIAGNOSIS — R42 Dizziness and giddiness: Secondary | ICD-10-CM | POA: Insufficient documentation

## 2012-08-22 DIAGNOSIS — R0789 Other chest pain: Secondary | ICD-10-CM | POA: Insufficient documentation

## 2012-08-22 DIAGNOSIS — Z8639 Personal history of other endocrine, nutritional and metabolic disease: Secondary | ICD-10-CM | POA: Insufficient documentation

## 2012-08-22 HISTORY — DX: Pure hypercholesterolemia, unspecified: E78.00

## 2012-08-22 LAB — CBC WITH DIFFERENTIAL/PLATELET
Basophils Absolute: 0 10*3/uL (ref 0.0–0.1)
Basophils Relative: 0 % (ref 0–1)
Eosinophils Absolute: 0.1 10*3/uL (ref 0.0–0.7)
Eosinophils Relative: 1 % (ref 0–5)
MCH: 32.5 pg (ref 26.0–34.0)
MCHC: 35 g/dL (ref 30.0–36.0)
MCV: 92.9 fL (ref 78.0–100.0)
Platelets: 183 10*3/uL (ref 150–400)
RDW: 12.8 % (ref 11.5–15.5)

## 2012-08-22 LAB — URINALYSIS, ROUTINE W REFLEX MICROSCOPIC
Glucose, UA: NEGATIVE mg/dL
Hgb urine dipstick: NEGATIVE
Ketones, ur: NEGATIVE mg/dL
Leukocytes, UA: NEGATIVE
pH: 7.5 (ref 5.0–8.0)

## 2012-08-22 LAB — COMPREHENSIVE METABOLIC PANEL
ALT: 42 U/L (ref 0–53)
AST: 23 U/L (ref 0–37)
CO2: 22 mEq/L (ref 19–32)
Chloride: 105 mEq/L (ref 96–112)
GFR calc non Af Amer: 87 mL/min — ABNORMAL LOW (ref >90)
Glucose, Bld: 99 mg/dL (ref 70–99)
Sodium: 138 mEq/L (ref 135–145)
Total Bilirubin: 0.3 mg/dL (ref 0.3–1.2)

## 2012-08-22 LAB — TROPONIN I: Troponin I: <0.3 ng/mL (ref <0.30)

## 2012-08-22 MED ORDER — ASPIRIN 81 MG PO CHEW
324.0000 mg | CHEWABLE_TABLET | Freq: Once | ORAL | Status: AC
  Administered 2012-08-22: 324 mg via ORAL
  Filled 2012-08-22: qty 4

## 2012-08-22 NOTE — ED Notes (Signed)
Pt returned from radiology in NAD.

## 2012-08-22 NOTE — ED Notes (Signed)
Pt reports right flank pain x 3-4 hours- hx of kidney stones- states had a ?panic attack earlier today- states has been feeling SOB and dizzy also

## 2012-08-22 NOTE — ED Provider Notes (Addendum)
History     CSN: [id number]  Arrival date & time 08/22/12  1943   First MD Initiated Contact with Patient 08/22/12 1959      Chief Complaint  Patient presents with  . Shortness of Breath  . Dizziness  . Abdominal Pain    (Consider location/radiation/quality/duration/timing/severity/associated sxs/prior treatment) Patient is a 47 y.o. male presenting with shortness of breath and abdominal pain.  Shortness of Breath Associated symptoms: abdominal pain   Abdominal Pain Associated symptoms: shortness of breath    Pt has multiple complaints. He has noticed R groin pain for a few days which he thought was a kidney stone, for the last day or so, he has also noticed pain in RUQ abdomen and R flank. Today around 1630hrs the patient had sudden onset of palpitations, SOB, chest tightness, dizziness and anxious feeling, similar to previous panic attacks but minimally improved with Bupsar x 2 at home. He continues to have these symptoms in addition to moderate aching RUQ pain, not associated with vomiting, fever or diarrhea. He denies any recent cough or congestion. No dysuria or hematuria.   Past Medical History  Diagnosis Date  . High cholesterol     Past Surgical History  Procedure Laterality Date  . Hernia repair      No family history on file.  History  Substance Use Topics  . Smoking status: Former Games developer  . Smokeless tobacco: Never Used  . Alcohol Use: No      Review of Systems  Respiratory: Positive for shortness of breath.   Gastrointestinal: Positive for abdominal pain.   All other systems reviewed and are negative except as noted in HPI.   Allergies  Naproxen sodium  Home Medications   Current Outpatient Rx  Name  Route  Sig  Dispense  Refill  . busPIRone (BUSPAR) 10 MG tablet   Oral   Take 10 mg by mouth 2 (two) times daily as needed (anxiety).         Marland Kitchen testosterone cypionate (DEPOTESTOTERONE CYPIONATE) 200 MG/ML injection   Intramuscular   Inject  into the muscle every 28 (twenty-eight) days.         . Calcium Carbonate Antacid (ANTACID) 1177 MG CHEW   Oral   Chew 1 tablet by mouth once.           Marland Kitchen LEVOFLOXACIN PO   Oral   Take by mouth.         . Tamsulosin HCl (FLOMAX) 0.4 MG CAPS   Oral   Take 1 capsule (0.4 mg total) by mouth at bedtime as needed (until kidney stone passes).   7 capsule   0     BP 133/77  Pulse 87  Temp(Src) 98 F (36.7 C) (Oral)  Resp 18  Ht 6' (1.829 m)  Wt 270 lb (122.471 kg)  BMI 36.61 kg/m2  SpO2 100%  Physical Exam  Nursing note and vitals reviewed. Constitutional: He is oriented to person, place, and time. He appears well-developed and well-nourished.  HENT:  Head: Normocephalic and atraumatic.  Eyes: EOM are normal. Pupils are equal, round, and reactive to light.  Neck: Normal range of motion. Neck supple.  Cardiovascular: Normal rate, normal heart sounds and intact distal pulses.   Pulmonary/Chest: Effort normal and breath sounds normal.  Abdominal: Bowel sounds are normal. He exhibits no distension. There is tenderness (RUQ). There is guarding (positive murphy's sign). There is no rebound.  Musculoskeletal: Normal range of motion. He exhibits no edema and no tenderness.  Neurological: He is alert and oriented to person, place, and time. He has normal strength. No cranial nerve deficit or sensory deficit.  Skin: Skin is warm and dry. No rash noted.  Psychiatric: He has a normal mood and affect.    ED Course  Procedures (including critical care time)  Labs Reviewed  URINALYSIS, ROUTINE W REFLEX MICROSCOPIC - Abnormal; Notable for the following:    Specific Gravity, Urine 1.002 (*)    All other components within normal limits  COMPREHENSIVE METABOLIC PANEL - Abnormal; Notable for the following:    GFR calc non Af Amer 87 (*)    All other components within normal limits  LIPASE, BLOOD  CBC WITH DIFFERENTIAL  TROPONIN I   Dg Chest 2 View  08/22/2012  *RADIOLOGY REPORT*   Clinical Data: Shortness of breath  CHEST - 2 VIEW  Comparison: 06/10/2011  Findings: Cardiomediastinal silhouette is within normal limits. The lungs are clear. No pleural effusion.  No pneumothorax.  No acute osseous abnormality.  IMPRESSION: Normal chest.   Original Report Authenticated By: Christiana Pellant, M.D.    US Abdomen Complete  08/22/2012  *RADIOLOGY REPORT*  Clinical Data:  Abdominal pain  ABDOMINAL ULTRASOUND COMPLETE  Comparison:  05/04/2011  Findings:  Gallbladder:  No gallstones, gallbladder wall thickening, or pericholecystic fluid.  Common Bile Duct:  Within normal limits in caliber.  Liver: Diffusely increased echogenicity without focal mass. Stable.  IVC:  Obscured.  Pancreas:  Obscured.  Grossly within normal limits.  Spleen:  Within normal limits in size and echotexture.  Right kidney:  Normal in size and echogenicity.  No hydronephrosis. Echogenic focus at the corticomedullary junction is of unknown significance.  Left kidney:  Normal in size and echogenicity.  No hydronephrosis or mass.  Small echogenic foci at the corticomedullary junction without shadowing are of unknown significance.  Abdominal Aorta:  Portions of the aorta were obscured.  Maximal visualized caliber was 3.6 cm.  IMPRESSION: No acute intra-abdominal pathology.  Limited visualization of the IVC, pancreas, and aorta.  Hepatic steatosis.   Original Report Authenticated By: Jolaine Click, M.D.      No diagnosis found.    MDM   Date: 08/22/2012  Rate: 90  Rhythm: normal sinus rhythm  QRS Axis: normal  Intervals: normal  ST/T Wave abnormalities: normal  Conduction Disutrbances: none  Narrative Interpretation: unremarkable  Pt has no objective signs of dyspnea, normal vitals, appears calm. Abd is tender in RUQ, concerning for biliary disease, but doubt this is cardiac related. Will send for [place]. UA neg for hematuria. Doubt renal stone.    9:43 PM Panic attack symptoms have improved. Normal labs and imaging. No  concern for ACS, PERC neg. R flank and RUQ pain without definite cause. Normal CMP and [place]. No signs of hydronephrosis on [place]. Pt advised PCP followup.      Charles B. Bernette Mayers, MD 08/22/12 2144

## 2012-08-22 NOTE — ED Notes (Signed)
Patient transported to X-ray 

## 2012-08-22 NOTE — ED Notes (Signed)
Reports sob with exertion (which is not normal for pt per report) and points to right ribs for location of pain

## 2012-08-22 NOTE — ED Notes (Signed)
MD at bedside. 

## 2012-09-14 ENCOUNTER — Ambulatory Visit: Payer: BC Managed Care – PPO | Admitting: Internal Medicine

## 2013-01-22 ENCOUNTER — Ambulatory Visit (INDEPENDENT_AMBULATORY_CARE_PROVIDER_SITE_OTHER): Payer: PRIVATE HEALTH INSURANCE | Admitting: Cardiovascular Disease

## 2013-01-22 ENCOUNTER — Encounter: Payer: Self-pay | Admitting: Cardiovascular Disease

## 2013-01-22 VITALS — BP 128/70 | HR 86 | Ht 72.0 in | Wt 283.0 lb

## 2013-01-22 DIAGNOSIS — R0609 Other forms of dyspnea: Secondary | ICD-10-CM

## 2013-01-22 DIAGNOSIS — R002 Palpitations: Secondary | ICD-10-CM

## 2013-01-22 DIAGNOSIS — E669 Obesity, unspecified: Secondary | ICD-10-CM | POA: Insufficient documentation

## 2013-01-22 DIAGNOSIS — G4733 Obstructive sleep apnea (adult) (pediatric): Secondary | ICD-10-CM

## 2013-01-22 DIAGNOSIS — E291 Testicular hypofunction: Secondary | ICD-10-CM

## 2013-01-22 DIAGNOSIS — E349 Endocrine disorder, unspecified: Secondary | ICD-10-CM | POA: Insufficient documentation

## 2013-01-22 DIAGNOSIS — R0600 Dyspnea, unspecified: Secondary | ICD-10-CM

## 2013-01-22 DIAGNOSIS — E0590 Thyrotoxicosis, unspecified without thyrotoxic crisis or storm: Secondary | ICD-10-CM

## 2013-01-22 DIAGNOSIS — E781 Pure hyperglyceridemia: Secondary | ICD-10-CM

## 2013-01-22 DIAGNOSIS — R012 Other cardiac sounds: Secondary | ICD-10-CM

## 2013-01-22 NOTE — Assessment & Plan Note (Signed)
He is compliant with CPAP. It is conceivable that his fourth heart sound is related to right ventricular hypertrophy secondary pulmonary hypertension, although there are no other physical findings to suggest pulmonary hypertension.

## 2013-01-22 NOTE — Assessment & Plan Note (Signed)
He appears generally committed to exercising for weight loss, but he is afraid to do so because of his recent symptoms. He is also following what seems to be a appropriate diet to assist with weight loss. I encouraged him to keep up the good work.

## 2013-01-22 NOTE — Assessment & Plan Note (Addendum)
Also note hyperglycemia and low HDL-C, all compatible with the metabolic syndrome/insulin resistance related to severe obesity. Weight loss, dietary changes, exercise all discussed in detail.

## 2013-01-22 NOTE — Assessment & Plan Note (Signed)
Treated, and now euthyroid.

## 2013-01-22 NOTE — Progress Notes (Signed)
Patient ID: Stephen Franklin, male   DOB: 07-16-1965, 47 y.o.   MRN: [id number]    Reason for office visit Palpitations, shortness of breath  Stephen Franklin is an obese gentleman without known heart disease who presents with complaints of palpitations at rest over the last couple of months. He is also noticed shortness of breath and easy fatigue which he blames on being "out of shape. He has obstructive sleep apnea and is compliant with CPAP. His palpitations improved somewhat when he removed caffeinated beverages from his diet. He was also discovered to have hyperthyroidism and his palpitations further improved once he became euthyroid. Nevertheless he continues to have what he describes as missing beats and brief bursts of arrhythmia. He does not appear to describe sustained symptomatic palpitations. He checks his blood pressure and heart rate frequently throughout the day. His blood pressures consistently normal in the 110-120/60-70 range. His heart rate wasa often be greater than 100 beats per minute at rest. Once his thyroid condition was treated his heart rate has been between 70 and 90 beats per minute at rest.  He used to exercise on the treadmill, 15 minutes at 3 mi./h 0 incline and s states that he would feel better afterwards. He has not exercised at least 7 months. He describes himself as being a very anxious person. He does not have chest pain dizziness or syncope.  He uses CPAP for obstructive sleep apnea and claims virtually 100% compliance.  Roughly a year ago he had profound fatigue and he stopped exercising. He then discovered that he had very low serum testosterone levels. His fatigue improved with testosterone injections.  Allergies  Allergen Reactions  . Naproxen Sodium Hives, Shortness Of Breath and Swelling    Current Outpatient Prescriptions  Medication Sig Dispense Refill  . Calcium Carbonate Antacid (ANTACID) 1177 MG CHEW Chew 1 tablet by mouth once.        . methimazole  (TAPAZOLE) 5 MG tablet Take 5 mg by mouth daily. 1/2 tablet daily      . testosterone cypionate (DEPOTESTOTERONE CYPIONATE) 200 MG/ML injection Inject into the muscle every 28 (twenty-eight) days.       No current facility-administered medications for this visit.    Past Medical History  Diagnosis Date  . High cholesterol     Past Surgical History  Procedure Laterality Date  . Hernia repair      No family history on file.  History   Social History  . Marital Status: Married    Spouse Name: N/A    Number of Children: N/A  . Years of Education: N/A   Occupational History  . Not on file.   Social History Main Topics  . Smoking status: Former Games developer  . Smokeless tobacco: Former Neurosurgeon    Quit date: 06/13/2010  . Alcohol Use: No  . Drug Use: No  . Sexually Active: No   Other Topics Concern  . Not on file   Social History Narrative  . No narrative on file    Review of systems: The patient specifically denies any chest pain at rest or with exertion, dyspnea at rest, orthopnea, paroxysmal nocturnal dyspnea, syncope, focal neurological deficits, intermittent claudication, lower extremity edema, unexplained weight gain, cough, hemoptysis or wheezing.  The patient also denies abdominal pain, nausea, vomiting, dysphagia, diarrhea, constipation, polyuria, polydipsia, dysuria, hematuria, frequency, urgency, abnormal bleeding or bruising, fever, chills, unexpected weight changes, mood swings, change in skin or hair texture, change in voice quality, auditory or visual problems, allergic  reactions or rashes, new musculoskeletal complaints other than usual "aches and pains".   PHYSICAL EXAM BP 128/70  Pulse 86  Ht 6' (1.829 m)  Wt 283 lb (128.368 kg)  BMI 38.37 kg/m2  General: Alert, oriented x3, no distress, severely obese Head: no evidence of trauma, PERRL, EOMI, no exophtalmos or lid lag, no myxedema, no xanthelasma; normal ears, nose and oropharynx Neck: normal jugular  venous pulsations and no hepatojugular reflux; brisk carotid pulses without delay and no carotid bruits Chest: clear to auscultation, no signs of consolidation by percussion or palpation, normal fremitus, symmetrical and full respiratory excursions Cardiovascular: normal position and quality of the apical impulse, regular rhythm, normal first and second heart sounds, no murmurs, rubs. He has a very distinct fourth heart sound. This is heard best in the left lower sternal border and the precordial area Abdomen: no tenderness or distention, no masses by palpation, no abnormal pulsatility or arterial bruits, normal bowel sounds, no hepatosplenomegaly Extremities: no clubbing, cyanosis or edema; 2+ radial, ulnar and brachial pulses bilaterally; 2+ right femoral, posterior tibial and dorsalis pedis pulses; 2+ left femoral, posterior tibial and dorsalis pedis pulses; no subclavian or femoral bruits Neurological: grossly nonfocal   EKG: Sinus rhythm  Lipid Panel     Component Value Date/Time   CHOL 174 01/02/2010 1237   TRIG 298.0* 01/02/2010 1237   HDL 30.80* 01/02/2010 1237   CHOLHDL 6 01/02/2010 1237   VLDL 59.6* 01/02/2010 1237    BMET    Component Value Date/Time   NA 138 08/22/2012 2023   K 3.9 08/22/2012 2023   CL 105 08/22/2012 2023   CO2 22 08/22/2012 2023   GLUCOSE 99 08/22/2012 2023   BUN 12 08/22/2012 2023   CREATININE 1.00 08/22/2012 2023   CALCIUM 9.2 08/22/2012 2023   GFRNONAA 87* 08/22/2012 2023   GFRAA >90 08/22/2012 2023     ASSESSMENT AND PLAN Palpitations Is to be describing isolated PACs or PVCs. These do not appear to be triggered by exercise but are clearly related to use of caffeinated beverages and were much more prevalent when he was thyrotoxic. They seem to have settled down now. However they persist despite the fact that he is now euthyroid and has completely cut out caffeine. He is very anxious to know the significance. We discussed the fact that it is most important to  establish whether or not he has any structural cardiac abnormalities or coronary disease. I recommended that he undergo a treadmill stress test and an echocardiogram. The echo will also help evaluate the cause of his fourth heart sound. The treadmill stress test will also allow [place] to give a more detailed exercise prescription for weight loss.  OBSTRUCTIVE SLEEP APNEA He is compliant with CPAP. It is conceivable that his fourth heart sound is related to right ventricular hypertrophy secondary pulmonary hypertension, although there are no other physical findings to suggest pulmonary hypertension.  Hyperthyroidism Treated, and now euthyroid.  Dyspnea I suspect that he primarily suffers from deconditioning. The echocardiogram will help exclude cardiac causes of dyspnea and also evaluate for adverse consequences of obstructive sleep apnea that may cause dyspnea.  Severe obesity (BMI 35.0-39.9) He appears generally committed to exercising for weight loss, but he is afraid to do so because of his recent symptoms. He is also following what seems to be a appropriate diet to assist with weight loss. I encouraged him to keep up the good work.  Hypotestosteronemia Confirmed by laboratory testing, being treated  Hypertriglyceridemia Also  note hyperglycemia and low HDL-C, all compatible with the metabolic syndrome/insulin resistance related to severe obesity. Weight loss, dietary changes, exercise all discussed in detail.   Orders Placed This Encounter  Procedures  . EKG 12-Lead  . Exercise tolerance test  . 2D Echocardiogram without contrast   Meds ordered this encounter  Medications  . methimazole (TAPAZOLE) 5 MG tablet    Sig: Take 5 mg by mouth daily. 1/2 tablet daily    Puja Caffey  Thurmon Fair, MD, [facility] [phone] office [phone] pager

## 2013-01-22 NOTE — Assessment & Plan Note (Signed)
Is to be describing isolated PACs or PVCs. These do not appear to be triggered by exercise but are clearly related to use of caffeinated beverages and were much more prevalent when he was thyrotoxic. They seem to have settled down now. However they persist despite the fact that he is now euthyroid and has completely cut out caffeine. He is very anxious to know the significance. We discussed the fact that it is most important to establish whether or not he has any structural cardiac abnormalities or coronary disease. I recommended that he undergo a treadmill stress test and an echocardiogram. The echo will also help evaluate the cause of his fourth heart sound. The treadmill stress test will also allow [place] to give a more detailed exercise prescription for weight loss.

## 2013-01-22 NOTE — Assessment & Plan Note (Signed)
I suspect that he primarily suffers from deconditioning. The echocardiogram will help exclude cardiac causes of dyspnea and also evaluate for adverse consequences of obstructive sleep apnea that may cause dyspnea.

## 2013-01-22 NOTE — Patient Instructions (Signed)
Your physician has requested that you have an echocardiogram. Echocardiography is a painless test that uses sound waves to create images of your heart. It provides your doctor with information about the size and shape of your heart and how well your heart's chambers and valves are working. This procedure takes approximately one hour. There are no restrictions for this procedure.  Your physician has requested you set up an exercise Treadmill Test.  Your physician recommends that you schedule a follow-up appointment in: After above testing is complete and interpreted.

## 2013-01-22 NOTE — Assessment & Plan Note (Signed)
Confirmed by laboratory testing, being treated

## 2013-01-25 ENCOUNTER — Ambulatory Visit (HOSPITAL_COMMUNITY)
Admission: RE | Admit: 2013-01-25 | Discharge: 2013-01-25 | Disposition: A | Discharge status: Home / Self Care | Payer: PRIVATE HEALTH INSURANCE | Source: Ambulatory Visit | Attending: Cardiovascular Disease | Admitting: Cardiovascular Disease

## 2013-01-25 ENCOUNTER — Other Ambulatory Visit: Payer: Self-pay

## 2013-01-25 ENCOUNTER — Ambulatory Visit (HOSPITAL_BASED_OUTPATIENT_CLINIC_OR_DEPARTMENT_OTHER)
Admission: RE | Admit: 2013-01-25 | Discharge: 2013-01-25 | Disposition: A | Discharge status: Home / Self Care | Payer: PRIVATE HEALTH INSURANCE | Source: Ambulatory Visit | Attending: Cardiovascular Disease | Admitting: Cardiovascular Disease

## 2013-01-25 DIAGNOSIS — R0609 Other forms of dyspnea: Secondary | ICD-10-CM | POA: Insufficient documentation

## 2013-01-25 DIAGNOSIS — R0602 Shortness of breath: Secondary | ICD-10-CM

## 2013-01-25 DIAGNOSIS — R0989 Other specified symptoms and signs involving the circulatory and respiratory systems: Secondary | ICD-10-CM | POA: Insufficient documentation

## 2013-01-25 DIAGNOSIS — R012 Other cardiac sounds: Secondary | ICD-10-CM

## 2013-01-25 DIAGNOSIS — R002 Palpitations: Secondary | ICD-10-CM | POA: Insufficient documentation

## 2013-01-25 DIAGNOSIS — E669 Obesity, unspecified: Secondary | ICD-10-CM | POA: Insufficient documentation

## 2013-01-25 DIAGNOSIS — G4733 Obstructive sleep apnea (adult) (pediatric): Secondary | ICD-10-CM | POA: Insufficient documentation

## 2013-01-25 DIAGNOSIS — E785 Hyperlipidemia, unspecified: Secondary | ICD-10-CM | POA: Insufficient documentation

## 2013-01-25 DIAGNOSIS — Z87891 Personal history of nicotine dependence: Secondary | ICD-10-CM | POA: Insufficient documentation

## 2013-01-25 DIAGNOSIS — R0600 Dyspnea, unspecified: Secondary | ICD-10-CM

## 2013-01-25 NOTE — Progress Notes (Signed)
[facility]   2D echo completed 01/25/2013.   Cindy Latifah Padin, RDCS  

## 2013-01-29 ENCOUNTER — Encounter: Payer: Self-pay | Admitting: Cardiovascular Disease

## 2013-01-29 ENCOUNTER — Telehealth: Payer: Self-pay | Admitting: Cardiovascular Disease

## 2013-01-29 NOTE — Telephone Encounter (Signed)
Returned call.  Pt informed all results are not back yet and that is why he hasn't been notified.  Pt also reminded he has an appt next week to f/u on test results, which is why he likely will receive results at that visit.  Pt verbalized understanding and agreed w/ plan.

## 2013-01-29 NOTE — Telephone Encounter (Signed)
Pt's wife was calling concerned about his tests. They have not heard anything from [place] yet and he was worried about it.

## 2013-02-06 ENCOUNTER — Encounter: Payer: Self-pay | Admitting: Cardiovascular Disease

## 2013-02-06 ENCOUNTER — Ambulatory Visit (INDEPENDENT_AMBULATORY_CARE_PROVIDER_SITE_OTHER): Payer: PRIVATE HEALTH INSURANCE | Admitting: Cardiovascular Disease

## 2013-02-06 VITALS — BP 136/88 | HR 92 | Resp 20 | Ht 72.0 in | Wt 282.7 lb

## 2013-02-06 DIAGNOSIS — E669 Obesity, unspecified: Secondary | ICD-10-CM

## 2013-02-06 DIAGNOSIS — R0609 Other forms of dyspnea: Secondary | ICD-10-CM

## 2013-02-06 DIAGNOSIS — R002 Palpitations: Secondary | ICD-10-CM

## 2013-02-06 DIAGNOSIS — R0600 Dyspnea, unspecified: Secondary | ICD-10-CM

## 2013-02-06 DIAGNOSIS — G4733 Obstructive sleep apnea (adult) (pediatric): Secondary | ICD-10-CM

## 2013-02-06 NOTE — Patient Instructions (Signed)
Your physician recommends that you schedule a follow-up appointment if symptoms worsen or fail to improve as anticipated.

## 2013-02-06 NOTE — Progress Notes (Signed)
Patient ID: Stephen Franklin, male   DOB: Aug 23, 1965, 47 y.o.   MRN: [id number]     Reason for office visit Followup echocardiogram and stress test  Chuck returns in followup after undergoing studies designed to identify possible cause of his dyspnea and fatigue. His echocardiogram showed normal left ventricular systolic and diastolic function and did not show evidence of pulmonary artery hypertension. He did quite well on a Bruce protocol treadmill stress test exercising for almost 10 minutes, achieving a work load of over 10 minutes and did not have any meaningful chest pain, dyspnea or EKG changes during exercise. No arrhythmia occurred during exercise, just normal sinus tachycardia  He has noticed a clear correlation between his symptoms and the time of administration of testosterone injections. He has been receiving these injections twice a month because of persistently low testosterone levels. Every time the shots are administered he feels extremely anxious irritable and dyspneic. He feels his heart pounding. He has noticed that he is flushed.    Allergies  Allergen Reactions  . Naproxen Sodium Hives, Shortness Of Breath and Swelling    Current Outpatient Prescriptions  Medication Sig Dispense Refill  . Calcium Carbonate Antacid (ANTACID) 1177 MG CHEW Chew 1 tablet by mouth once.        . methimazole (TAPAZOLE) 5 MG tablet Take 5 mg by mouth daily. 1/2 tablet daily      . testosterone cypionate (DEPOTESTOTERONE CYPIONATE) 200 MG/ML injection Inject into the muscle every 28 (twenty-eight) days.       No current facility-administered medications for this visit.    Past Medical History  Diagnosis Date  . High cholesterol     Past Surgical History  Procedure Laterality Date  . Hernia repair      No family history of premature cardiac illness or other vascular problems.  History   Social History  . Marital Status: Married    Spouse Name: N/A    Number of Children: N/A  .  Years of Education: N/A   Occupational History  . Not on file.   Social History Main Topics  . Smoking status: Former Games developer  . Smokeless tobacco: Former Neurosurgeon    Quit date: 06/13/2010  . Alcohol Use: No  . Drug Use: No  . Sexual Activity: No   Other Topics Concern  . Not on file   Social History Narrative  . No narrative on file    Review of systems: The patient specifically denies any chest pain at rest or with exertion, dyspnea at rest, orthopnea, paroxysmal nocturnal dyspnea, syncope, focal neurological deficits, intermittent claudication, lower extremity edema, unexplained weight gain, cough, hemoptysis or wheezing.  The patient also denies abdominal pain, nausea, vomiting, dysphagia, diarrhea, constipation, polyuria, polydipsia, dysuria, hematuria, frequency, urgency, abnormal bleeding or bruising, fever, chills, unexpected weight changes, mood swings, change in skin or hair texture, change in voice quality, auditory or visual problems, allergic reactions or rashes, new musculoskeletal complaints other than usual "aches and pains".   PHYSICAL EXAM BP 136/88  Pulse 92  Resp 20  Ht 6' (1.829 m)  Wt 282 lb 11.2 oz (128.232 kg)  BMI 38.33 kg/m2 General: Alert, oriented x3, no distress, severely obese , his face is flushed Head: no evidence of trauma, PERRL, EOMI, no exophtalmos or lid lag, no myxedema, no xanthelasma; normal ears, nose and oropharynx  Neck: normal jugular venous pulsations and no hepatojugular reflux; brisk carotid pulses without delay and no carotid bruits  Chest: clear to auscultation, no signs of  consolidation by percussion or palpation, normal fremitus, symmetrical and full respiratory excursions  Cardiovascular: normal position and quality of the apical impulse, regular rhythm, normal first and second heart sounds, no murmurs, rubs. He has a very distinct fourth heart sound. This is heard best in the left lower sternal border and the precordial area    Abdomen: no tenderness or distention, no masses by palpation, no abnormal pulsatility or arterial bruits, normal bowel sounds, no hepatosplenomegaly  Extremities: no clubbing, cyanosis or edema; 2+ radial, ulnar and brachial pulses bilaterally; 2+ right femoral, posterior tibial and dorsalis pedis pulses; 2+ left femoral, posterior tibial and dorsalis pedis pulses; no subclavian or femoral bruits  Neurological: grossly nonfocal   EKG: Sinus rhythm, normal tracing  Lipid Panel     Component Value Date/Time   CHOL 174 01/02/2010 1237   TRIG 298.0* 01/02/2010 1237   HDL 30.80* 01/02/2010 1237   CHOLHDL 6 01/02/2010 1237   VLDL 59.6* 01/02/2010 1237    BMET    Component Value Date/Time   NA 138 08/22/2012 2023   K 3.9 08/22/2012 2023   CL 105 08/22/2012 2023   CO2 22 08/22/2012 2023   GLUCOSE 99 08/22/2012 2023   BUN 12 08/22/2012 2023   CREATININE 1.00 08/22/2012 2023   CALCIUM 9.2 08/22/2012 2023   GFRNONAA 87* 08/22/2012 2023   GFRAA >90 08/22/2012 2023     ASSESSMENT AND PLAN  [name] has no identifiable structural heart disease by echocardiography her physical exam, has a normal exercise tolerance and no evidence of exercise-induced ischemia by stress testing. He has treated obstructive sleep apnea and is moderately obese which may explain part of the symptoms.   For the most part , his symptoms appear to be associated with a new administration of testosterone injections. Many of his complaints, including irritability and anxiety, flushing and hypomanic symptoms are clearly associated with androgen steroids. The steroids did resolve his complaints of fatigue. The tachycardia that he had previously experienced has resolved now that he is euthyroid.   He may do better with a more constant and low dose administration of testosterone, such as in the form of a cutaneous gel or patch. At this point there does not appear to be a reason to continue cardiovascular workup although he is free of call  us should he develop new troublesome complaints.  Weight loss would definitely be beneficial.  Nikisha Fleece  Thurmon Fair, MD, [facility] [phone] office [phone] pager

## 2013-10-17 DIAGNOSIS — N200 Calculus of kidney: Secondary | ICD-10-CM | POA: Insufficient documentation

## 2014-03-13 DIAGNOSIS — R7303 Prediabetes: Secondary | ICD-10-CM | POA: Insufficient documentation

## 2014-03-13 DIAGNOSIS — F419 Anxiety disorder, unspecified: Secondary | ICD-10-CM | POA: Insufficient documentation

## 2014-03-13 DIAGNOSIS — E291 Testicular hypofunction: Secondary | ICD-10-CM | POA: Insufficient documentation

## 2014-03-13 DIAGNOSIS — R1031 Right lower quadrant pain: Secondary | ICD-10-CM | POA: Insufficient documentation

## 2014-03-13 DIAGNOSIS — Z87442 Personal history of urinary calculi: Secondary | ICD-10-CM | POA: Insufficient documentation

## 2014-03-13 DIAGNOSIS — M1712 Unilateral primary osteoarthritis, left knee: Secondary | ICD-10-CM | POA: Insufficient documentation

## 2014-03-13 DIAGNOSIS — M75102 Unspecified rotator cuff tear or rupture of left shoulder, not specified as traumatic: Secondary | ICD-10-CM | POA: Insufficient documentation

## 2015-07-22 DIAGNOSIS — K760 Fatty (change of) liver, not elsewhere classified: Secondary | ICD-10-CM | POA: Insufficient documentation

## 2016-01-30 ENCOUNTER — Telehealth: Payer: Self-pay | Admitting: Cardiovascular Disease

## 2016-01-30 NOTE — Telephone Encounter (Signed)
F/u      Pt would like an earlier appt than offered with dr on 9/13 and with the PA on 9/7. He would like to be worked in if possible. Please call.

## 2016-01-30 NOTE — Telephone Encounter (Signed)
Please handle via scheduling unless there is an issue that triage can address.

## 2016-01-30 NOTE — Telephone Encounter (Signed)
New message    Pt c/o of Chest Pain: STAT if CP now or developed within 24 hours  1. Are you having CP right now? no  2. Are you experiencing any other symptoms (ex. SOB, nausea, vomiting, sweating)? Dizzyness  3. How long have you been experiencing CP? 4 days  4. Is your CP continuous or coming and going? Come and go  5. Have you taken Nitroglycerin? no?

## 2016-02-04 ENCOUNTER — Encounter: Payer: Self-pay | Admitting: Cardiovascular Disease

## 2016-02-04 ENCOUNTER — Ambulatory Visit (INDEPENDENT_AMBULATORY_CARE_PROVIDER_SITE_OTHER): Payer: Self-pay | Admitting: Cardiovascular Disease

## 2016-02-04 VITALS — BP 118/70 | HR 82 | Ht 71.0 in | Wt 290.2 lb

## 2016-02-04 DIAGNOSIS — R002 Palpitations: Secondary | ICD-10-CM

## 2016-02-04 DIAGNOSIS — E781 Pure hyperglyceridemia: Secondary | ICD-10-CM

## 2016-02-04 DIAGNOSIS — G4733 Obstructive sleep apnea (adult) (pediatric): Secondary | ICD-10-CM

## 2016-02-04 MED ORDER — METOPROLOL TARTRATE 25 MG PO TABS: 12.5000 mg | ORAL_TABLET | Freq: Two times a day (BID) | ORAL | 11 refills | Status: DC

## 2016-02-04 NOTE — Progress Notes (Signed)
Cardiology Office Note    Date:  02/06/2016   ID:  Stephen Franklin, DOB 05/20/1966, MRN [id number]  PCP:  Wonda Cerise, MD  Cardiologist:   Sanda Klein, MD   Chief Complaint  Patient presents with  . Follow-up    History of Present Illness:  Stephen Franklin is a 50 y.o. male with obstructive sleep apnea on CPAP , morbid obesity and palpitations related to PVCs. He has normal left ventricular systolic and diastolic function, normal pulmonary artery pressure by echo, normal performance on a Bruce protocol treadmill stress testing without any ischemic changes. Many of his complaints seem to have resolved after he switched from monthly Depo-Testosterone injections to daily androgen gel. His symptoms seem to be related to the bolus doses of androgen.  Today he denies angina, dyspnea,  syncope, focal neurological complaints, claudication, flushing. Palpitations bother him and frequently.  Past Medical History:  Diagnosis Date  . High cholesterol     Past Surgical History:  Procedure Laterality Date  . HERNIA REPAIR      Current Medications: Outpatient Medications Prior to Visit  Medication Sig Dispense Refill  . testosterone cypionate (DEPOTESTOTERONE CYPIONATE) 200 MG/ML injection Inject into the muscle every 28 (twenty-eight) days.    . Calcium Carbonate Antacid (ANTACID) 1177 MG CHEW Chew 1 tablet by mouth once.      . methimazole (TAPAZOLE) 5 MG tablet Take 5 mg by mouth daily. 1/2 tablet daily     No facility-administered medications prior to visit.      Allergies:   Naproxen sodium and Bee venom   Social History   Social History  . Marital status: Married    Spouse name: N/A  . Number of children: N/A  . Years of education: N/A   Social History Main Topics  . Smoking status: Former Research scientist (life sciences)  . Smokeless tobacco: Former Systems developer    Quit date: 06/13/2010  . Alcohol use No  . Drug use: No  . Sexual activity: No   Other Topics Concern  . None   Social  History Narrative  . None      ROS:   Please see the history of present illness.    ROS All other systems reviewed and are negative.   PHYSICAL EXAM:   VS:  BP 118/70 (BP Location: Right Arm, Patient Position: Sitting, Cuff Size: Large)   Pulse 82   Ht 5\' 11"  (1.803 m)   Wt 290 lb 3.2 oz (131.6 kg)   BMI 40.47 kg/m    GEN: Well nourished, well developed, in no acute distress  HEENT: normal  Neck: no JVD, carotid bruits, or masses Cardiac: Occasional ectopy on a background of RRR; no murmurs, rubs, or gallops,no edema  Respiratory:  clear to auscultation bilaterally, normal work of breathing GI: soft, nontender, nondistended, + BS MS: no deformity or atrophy  Skin: warm and dry, no rash Neuro:  Alert and Oriented x 3, Strength and sensation are intact Psych: euthymic mood, full affect  Wt Readings from Last 3 Encounters:  02/04/16 290 lb 3.2 oz (131.6 kg)  02/06/13 282 lb 11.2 oz (128.2 kg)  01/22/13 283 lb (128.4 kg)      Studies/Labs Reviewed:   EKG:  EKG is ordered today.  The ekg ordered today demonstrates Sinus rhythm with PVCs, QTC 453 ms, otherwise normal  Recent Labs: No results found for requested labs within last 8760 hours.   Lipid Panel    Component Value Date/Time   CHOL 174 01/02/2010 1237  TRIG 298.0 (H) 01/02/2010 1237   HDL 30.80 (L) 01/02/2010 1237   CHOLHDL 6 01/02/2010 1237   VLDL 59.6 (H) 01/02/2010 1237   LDLDIRECT 95.1 01/02/2010 1237     ASSESSMENT:    1. Heart palpitations   2. Hypertriglyceridemia   3. Severe obesity (BMI 35.0-39.9) ([place])   4. Obstructive sleep apnea      PLAN:  In order of problems listed above:  1. PVCs: The seem to be related to PVCs and do not require specific therapy except to the degree that they bother him. Try very low dose of metoprolol. No evidence of structural heart disease by echo and stress testing. He reports that his hyperthyroidism is under control with methimazole, but I don't have any  recent labs. 2. Mixed dyslipidemia with low HDL and elevated triglycerides, a pattern related to his severe obesity. His LDL cholesterol was not bad and the triglyceride elevations not severe. I don't think he needs pharmacological therapy, but needs to focus on weight loss. 3. Morbid obesity: We spent some time discussing ways to lose weight, improve dietary choices and engage in regular exercise. 4. OSA: He reports compliance with CPAP    Medication Adjustments/Labs and Tests Ordered: Current medicines are reviewed at length with the patient today.  Concerns regarding medicines are outlined above.  Medication changes, Labs and Tests ordered today are listed in the Patient Instructions below. Patient Instructions  Dr Sallyanne Kuster has recommended making the following medication changes: 1. START Metoprolol 25 mg tablets - take 0.5 tablet (12.5 mg total) by mouth twice daily  >>Ok to take additional 0.5 tablet as needed for palpitations  Dr Sallyanne Kuster recommends that you schedule a follow-up appointment in 1 year. You will receive a reminder letter in the mail two months in advance. If you don't receive a letter, please call our office to schedule the follow-up appointment.  If you need a refill on your cardiac medications before your next appointment, please call your pharmacy.    Signed, Sanda Klein, MD  02/06/2016 4:32 PM    [facility] [address], [place], [place]  [address] Phone: [phone]; Fax: [phone]

## 2016-02-04 NOTE — Patient Instructions (Signed)
Dr Sallyanne Kuster has recommended making the following medication changes: 1. START Metoprolol 25 mg tablets - take 0.5 tablet (12.5 mg total) by mouth twice daily  >>Ok to take additional 0.5 tablet as needed for palpitations  Dr Sallyanne Kuster recommends that you schedule a follow-up appointment in 1 year. You will receive a reminder letter in the mail two months in advance. If you don't receive a letter, please call our office to schedule the follow-up appointment.  If you need a refill on your cardiac medications before your next appointment, please call your pharmacy.

## 2016-08-25 DIAGNOSIS — R0789 Other chest pain: Secondary | ICD-10-CM | POA: Insufficient documentation

## 2016-11-29 ENCOUNTER — Telehealth: Payer: Self-pay | Admitting: Cardiovascular Disease

## 2016-11-29 NOTE — Telephone Encounter (Signed)
Spoke with pt states that palpitations are more intense and stronger, pt has increased gas and head is feeling "foggy", and palpitations are happening about "every 10 beats" and pt has been taking metoprolol 25mg  BID (per last OV 01-2016 12.5mg  BID)"for quite awhile and palpitations are more intense and are happening about every 10 beats continuously all day long his BP is has been fine running 117/68 consistently all day. His BP last night 100/57. Pt states that he does not want to go to the ER, I informed pt that he needs to be evaluated espically since he has already doubled up on his metoprolol, he states that he has been taking it this way for awhile. Re-iterated for pt to go directly to the ER, pt states that his wife is not happy but he will go.

## 2016-11-29 NOTE — Telephone Encounter (Signed)
New message   Pt is calling stating that he is having more frequent and more intense.   Patient c/o Palpitations:  High priority if patient c/o lightheadedness and shortness of breath.  1. How long have you been having palpitations? 6 days  2. Are you currently experiencing lightheadedness and shortness of breath? lightheadedness  3. Have you checked your BP and heart rate? (document readings) bp-100/57 p-68  4. Are you experiencing any other symptoms? Pt states his head feels foggy when he has the palpitations.

## 2016-12-01 NOTE — Telephone Encounter (Signed)
Spoke with pt, he reports his palpitations have gotten better. He is aware of his appt Monday and oplans on keeping that appt. He does not need anything at this time. He will call if problems arise.

## 2016-12-06 ENCOUNTER — Ambulatory Visit (INDEPENDENT_AMBULATORY_CARE_PROVIDER_SITE_OTHER): Payer: Medicare PPO | Admitting: Cardiology

## 2016-12-06 ENCOUNTER — Encounter: Payer: Self-pay | Admitting: Cardiology

## 2016-12-06 VITALS — BP 106/72 | HR 68 | Ht 71.0 in | Wt 297.8 lb

## 2016-12-06 DIAGNOSIS — E781 Pure hyperglyceridemia: Secondary | ICD-10-CM

## 2016-12-06 DIAGNOSIS — Z6841 Body Mass Index (BMI) 40.0 and over, adult: Secondary | ICD-10-CM | POA: Diagnosis not present

## 2016-12-06 DIAGNOSIS — Z8659 Personal history of other mental and behavioral disorders: Secondary | ICD-10-CM | POA: Diagnosis not present

## 2016-12-06 DIAGNOSIS — G4733 Obstructive sleep apnea (adult) (pediatric): Secondary | ICD-10-CM

## 2016-12-06 DIAGNOSIS — R002 Palpitations: Secondary | ICD-10-CM | POA: Diagnosis not present

## 2016-12-06 MED ORDER — METOPROLOL SUCCINATE ER 25 MG PO TB24: 25.0000 mg | ORAL_TABLET | Freq: Every day | ORAL | 3 refills | Status: DC

## 2016-12-06 NOTE — Assessment & Plan Note (Signed)
BMI 41 

## 2016-12-06 NOTE — Assessment & Plan Note (Addendum)
On Zoloft and Brepiprazole

## 2016-12-06 NOTE — Progress Notes (Signed)
12/06/2016 Stephen Franklin   Oct 23, 1965  [id number]  Primary Physician [name], MD Primary Cardiologist: Dr Sallyanne Kuster  HPI:  51 y/o obese Caucasian male with a history of palpitations. He has had prior GXT and echo. He has been prescribed Metoprolol 12.5 mg BID but says he only takes this at night. He is in the office today with complaints of increased palpitations. He says he notices them more with exertion than with rest. He denies any chest pain or syncope. It does not sound like he is having sustained tachycardia.    Current Outpatient Prescriptions  Medication Sig Dispense Refill  . buPROPion (WELLBUTRIN SR) 150 MG 12 hr tablet Take 150 mg by mouth daily.    . clonazePAM (KLONOPIN) 1 MG tablet Take 1 tablet by mouth as needed.    . Omega-3 Fatty Acids (FISH OIL) 1000 MG CAPS Take by mouth.    . sertraline (ZOLOFT) 100 MG tablet Take 100 mg by mouth 2 (two) times daily.    Marland Kitchen testosterone (ANDROGEL) 50 MG/5GM (1%) GEL Place 5 g onto the skin daily.    . traZODone (DESYREL) 100 MG tablet Take 1 tablet by mouth at bedtime.    . TURMERIC PO Take 1,000 mg by mouth daily.    . metoprolol succinate (TOPROL XL) 25 MG 24 hr tablet Take 1 tablet (25 mg total) by mouth daily. 30 tablet 3   No current facility-administered medications for this visit.     Allergies  Allergen Reactions  . Naproxen Sodium Hives, Shortness Of Breath and Swelling  . Bee Venom Itching    Past Medical History:  Diagnosis Date  . High cholesterol     Social History   Social History  . Marital status: Married    Spouse name: N/A  . Number of children: N/A  . Years of education: N/A   Occupational History  . Not on file.   Social History Main Topics  . Smoking status: Former Research scientist (life sciences)  . Smokeless tobacco: Former Systems developer    Quit date: 06/13/2010  . Alcohol use No  . Drug use: No  . Sexual activity: No   Other Topics Concern  . Not on file   Social History Narrative  . No narrative on file      Family History  Problem Relation Age of Onset  . Heart failure Mother      Review of Systems: General: negative for chills, fever, night sweats or weight changes.  Cardiovascular: negative for chest pain, dyspnea on exertion, edema, orthopnea, palpitations, paroxysmal nocturnal dyspnea or shortness of breath Dermatological: negative for rash Respiratory: negative for cough or wheezing Urologic: negative for hematuria Abdominal: negative for nausea, vomiting, diarrhea, bright red blood per rectum, melena, or hematemesis Neurologic: negative for visual changes, syncope, or dizziness Pt complains of erectile dysfunction All other systems reviewed and are otherwise negative except as noted above.    Blood pressure 106/72, pulse 68, height 5\' 11"  (1.803 m), weight 297 lb 12.8 oz (135.1 kg).  General appearance: alert, cooperative, no distress and morbidly obese Neck: no carotid bruit and no JVD Lungs: clear to auscultation bilaterally Heart: regular rate and rhythm Extremities: extremities normal, atraumatic, no cyanosis or edema Skin: Skin color, texture, turgor normal. No rashes or lesions Neurologic: Grossly normal  EKG NSR (no ectopy)  ASSESSMENT AND PLAN:   Palpitations Pt seen today with complaints of increased palpitations- more with exertion than rest  Obesity BMI 41  Obstructive sleep apnea C-pap compliant  History of depression On Zoloft and Brepiprazole   PLAN  The pt has symptomatic palpitations. The fact that he notices them more with exertion is somewhat concerning. I suggested we get another GXT to document this. I also suggested we change his Metoprol to low dose Toprol 25 mg daily. I offered Bystolic but he was concerned about the cost and wanted to try the Toprol. I suggested the Zoloft may also be contributing to his ED complaints and that he should disuss this with his PCP.  The pt also wanted a lipid panel done and we'll check the morning of his GXT  since he'll be fasting. We discussed the importance of wgt loss and discussed exercise and diet.   Kerin Ransom PA-C 12/06/2016 9:18 AM

## 2016-12-06 NOTE — Patient Instructions (Addendum)
Medication Instructions:  STOP metoprolol tartrate   START metoprolol succinate (Toprol-XL) 25 mg (1 tablet) daily.  Labwork: Have FASTING blood work the morning of your stress test (Lipid panel).    Testing/Procedures: Your physician has requested that you have an exercise tolerance test. For further information please visit [email or web address]. Please also follow instruction sheet, as given.  *Hold your Toprol the day before and the day of the test*  Follow-Up: Your physician recommends that you schedule a follow-up appointment: after testing with Dr. Sallyanne Kuster.   Any Other Special Instructions Will Be Listed Below (If Applicable).     If you need a refill on your cardiac medications before your next appointment, please call your pharmacy.

## 2016-12-06 NOTE — Assessment & Plan Note (Signed)
C-pap compliant 

## 2016-12-06 NOTE — Assessment & Plan Note (Signed)
Pt seen today with complaints of increased palpitations- more with exertion than rest

## 2016-12-08 ENCOUNTER — Telehealth (HOSPITAL_COMMUNITY): Payer: Self-pay | Admitting: Cardiovascular Disease

## 2016-12-08 NOTE — Telephone Encounter (Signed)
Left detailed message per Runell Gess message-DPR

## 2016-12-08 NOTE — Telephone Encounter (Signed)
The point of the test was to see if he had more ectopy with exercise. If he is uncomfortable doing the test then he can wait on this and f/u with Dr Sallyanne Kuster

## 2016-12-08 NOTE — Telephone Encounter (Signed)
Patient called back and stating that the Nuclear test is to costly and will settle for the GXT on 7/17 and the best he can to get his heart rate up.

## 2016-12-08 NOTE — Telephone Encounter (Signed)
Spoke with patient to r/s his GXT appt . He voiced some concern as far not feeling physically secure with carrying out the GXT and wanted to know there was an alternative test that could be done. I informed him that a Lexiscan could be an option for him. He was receptive the idea and would like to explore that option. He also expressed some slight anxiety about the test and wanted to know if he could have a small dosage of Valium. Please f/u with patient with recommendations.   Thanks

## 2016-12-10 ENCOUNTER — Telehealth: Payer: Self-pay | Admitting: Cardiology

## 2016-12-10 NOTE — Telephone Encounter (Signed)
Spoke with patient and he started Metformin a few days before seeing Doreene Burke PA on 12/06/16. Luke changed patient from Lopressor to Toprol secondary to palpitations and patient only taking Lopressor daily at bedtime. He has been having problems with feeling tired and having a "raw" tongue. Denies any difficulty swallowing or with tongue swelling. Discussed with Raquel Pharm D and it is most likely coming form Metformin since patient had already been taking Metoprolol, recommended Biotine spray and if no better call prescribing MD. Advised patient. Did advise if fatigue no better after couple of weeks to call back. Patient verbalized understanding.

## 2016-12-10 NOTE — Telephone Encounter (Signed)
New Message  Pt c/o medication issue:  1. Name of Medication: Metoprolol  2. How are you currently taking this medication (dosage and times per day)? 25mg    3. Are you having a reaction (difficulty breathing--STAT)? No energy; feels exhausted.   4. What is your medication issue? Per pt states the medication did stop the palpitations but now he feels very tired. Pt states his tongue has become dry and raw. Please call back to discuss

## 2016-12-13 NOTE — Telephone Encounter (Signed)
Aggree with plan

## 2016-12-17 ENCOUNTER — Inpatient Hospital Stay (HOSPITAL_COMMUNITY): Admission: RE | Admit: 2016-12-17 | Payer: Medicare PPO | Source: Ambulatory Visit

## 2016-12-23 ENCOUNTER — Telehealth (HOSPITAL_COMMUNITY): Payer: Self-pay

## 2016-12-23 NOTE — Telephone Encounter (Signed)
Encounter complete. 

## 2016-12-28 ENCOUNTER — Ambulatory Visit (HOSPITAL_COMMUNITY)
Admission: RE | Admit: 2016-12-28 | Discharge: 2016-12-28 | Disposition: A | Discharge status: Home / Self Care | Payer: Medicare PPO | Source: Ambulatory Visit | Attending: Cardiovascular Disease | Admitting: Cardiovascular Disease

## 2016-12-28 DIAGNOSIS — R002 Palpitations: Secondary | ICD-10-CM | POA: Insufficient documentation

## 2016-12-28 DIAGNOSIS — R5383 Other fatigue: Secondary | ICD-10-CM | POA: Diagnosis not present

## 2016-12-28 DIAGNOSIS — R0602 Shortness of breath: Secondary | ICD-10-CM | POA: Insufficient documentation

## 2016-12-28 LAB — EXERCISE TOLERANCE TEST
Estimated workload: 9.3 METS
Exercise duration (min): 7 min
Exercise duration (sec): 30 s
MPHR: 168 {beats}/min
Peak HR: 144 {beats}/min
Percent HR: 85 %
RPE: 18
Rest HR: 96 {beats}/min

## 2017-01-06 ENCOUNTER — Telehealth: Payer: Self-pay | Admitting: Cardiology

## 2017-01-06 MED ORDER — METOPROLOL TARTRATE 25 MG PO TABS: 25.0000 mg | ORAL_TABLET | Freq: Two times a day (BID) | ORAL | 1 refills | Status: DC

## 2017-01-06 NOTE — Telephone Encounter (Signed)
Please call,pt says his Metoprolol is making him so exhausted.He wants to take something that will not last for 24hrs.

## 2017-01-06 NOTE — Telephone Encounter (Signed)
Returned call to patient  He is currently taking metoprolol succinate 25mg  QAM He had previously been taking QHS - and tolerated better  -- he had a few palps when taking this QHS so decided to take QAM to see if this would help control palps He states he feels very exhausted and feels like he hasn't slept, even when he does He previously took metoprolol tartrate 12.5mg  BID and did not feel exhausted - switched to long acting out of convenience He states if he has recurrent issues, he asked if he would stop it altogether Advised patient would defer to MD to [place] med change back to short-acting B-blocker to see if this helps side effects

## 2017-01-06 NOTE — Telephone Encounter (Signed)
Patient called with MD recommendations and voiced understanding Rx(s) sent to pharmacy electronically.

## 2017-01-06 NOTE — Telephone Encounter (Signed)
We could try this:  can take metoprolol tartrate 12.5 mg BID on a regular basis, with the option to take an additional 12.5 mg PRN during the day if he has a lot of palpitations. MCr

## 2017-01-11 ENCOUNTER — Telehealth: Payer: Self-pay | Admitting: Cardiology

## 2017-01-11 NOTE — Telephone Encounter (Signed)
New message      Pt is calling to let the nurse know that he has not forgotten to get the date of his lipid panel from his PCP.  As soon as he gets it, he will call and let the nurse know

## 2017-05-03 ENCOUNTER — Telehealth: Payer: Self-pay | Admitting: *Deleted

## 2017-05-03 NOTE — Telephone Encounter (Signed)
I called pt unable to leave a voicemail. Pt never been seen here before, pt need to call [facility] to request records.

## 2017-06-16 ENCOUNTER — Ambulatory Visit: Payer: Self-pay | Admitting: General Surgery

## 2017-06-16 NOTE — Progress Notes (Signed)
EKG 12-06-16 epic   ovn [facility] 05-17-17   Stress 12-28-16 epic

## 2017-06-16 NOTE — Patient Instructions (Addendum)
Stephen Franklin  06/16/2017   Your procedure is scheduled on: 06-23-17  Report to [facility] Main  Entrance                Report to admitting at       0730AM   Call this number if you have problems the morning of surgery [phone]   Remember: ONLY 1 PERSON MAY GO WITH YOU TO SHORT STAY TO GET  READY MORNING OF YOUR SURGERY.    :After Midnight.NO SOLID FOOD AFTER MIDNIGHT THE NIGHT PRIOR TO SURGERY. NOTHING BY MOUTH EXCEPT CLEAR LIQUIDS   UNTIL 3 HOURS PRIOR TO [place] SURGERY. PLEASE FINISH ENSURE DRINK PER SURGEON ORDER 3 HOURS PRIOR TO SCHEDULED   SURGERY TIME WHICH NEEDS TO BE COMPLETED AT ____0630am________.     Take these medicines the morning of surgery with A SIP OF WATER: zoloft, prevastatin, metoprolol, fenofibratre , wellbutrin , klonopin if needed             DO NOT TAKE ANY DIABETIC MEDICATIONS DAY OF YOUR SURGERY                               You may not have any metal on your body including hair pins and              piercings  Do not wear jewelry,lotions, powders or perfumes, deodorant     .              Men may shave face and neck.   Do not bring valuables to the hospital. [place].  Contacts, dentures or bridgework may not be worn into surgery.  Leave suitcase in the car. After surgery it may be brought to your room.                 Please read over the following fact sheets you were given: _____________________________________________________________________           [facility] - Preparing for Surgery Before surgery, you can play an important role.  Because skin is not sterile, your skin needs to be as free of germs as possible.  You can reduce the number of germs on your skin by washing with CHG (chlorahexidine gluconate) soap before surgery.  CHG is an antiseptic cleaner which kills germs and bonds with the skin to continue killing germs even after washing. Please DO NOT use if  you have an allergy to CHG or antibacterial soaps.  If your skin becomes reddened/irritated stop using the CHG and inform your nurse when you arrive at Short Stay. Do not shave (including legs and underarms) for at least 48 hours prior to the first CHG shower.  You may shave your face/neck. Please follow these instructions carefully:  1.  Shower with CHG Soap the night before surgery and the  morning of Surgery.  2.  If you choose to wash your hair, wash your hair first as usual with your  normal  shampoo.  3.  After you shampoo, rinse your hair and body thoroughly to remove the  shampoo.                           4.  Use  CHG as you would any other liquid soap.  You can apply chg directly  to the skin and wash                       Gently with a scrungie or clean washcloth.  5.  Apply the CHG Soap to your body ONLY FROM THE NECK DOWN.   Do not use on face/ open                           Wound or open sores. Avoid contact with eyes, ears mouth and genitals (private parts).                       Wash face,  Genitals (private parts) with your normal soap.             6.  Wash thoroughly, paying special attention to the area where your surgery  will be performed.  7.  Thoroughly rinse your body with warm water from the neck down.  8.  DO NOT shower/wash with your normal soap after using and rinsing off  the CHG Soap.                9.  Pat yourself dry with a clean towel.            10.  Wear clean pajamas.            11.  Place clean sheets on your bed the night of your first shower and do not  sleep with pets. Day of Surgery : Do not apply any lotions/deodorants the morning of surgery.  Please wear clean clothes to the hospital/surgery center.  FAILURE TO FOLLOW THESE INSTRUCTIONS MAY RESULT IN THE CANCELLATION OF YOUR SURGERY PATIENT SIGNATURE_________________________________  NURSE  SIGNATURE__________________________________  ________________________________________________________________________

## 2017-06-16 NOTE — H&P (View-Only) (Signed)
ANIVAL PASHA Documented: 06/16/2017 9:06 AM Location: [facility] Patient #: [id number] DOB: 02/01/1966 Married / Language: [name] / Race: White Male  History of Present Illness Randall Hiss M. Cindi Ghazarian MD; 06/16/2017 10:06 AM) The patient is a 52 year old male who presents with an abdominal wall hernia. He is referred by [name], [facility] for evaluation of a umbilical hernia. He states that it has been present for at least 3 months. It bulges and protrudes and at times it is painful mainly with physical activity. It is also annoying. He denies any nausea, vomiting, diarrhea or constipation. He denies any prior abdominal surgery. He does use CPAP for sleep apnea. He does also have diabetes type 2. He states his last A1c was 6.3. He is a former smoker. He states he underwent a negative stress test last summer. He is trying to lose weight but he feels that the hernia is interfering with his ability to be physically active.   Problem List/Past Medical Randall Hiss M. Redmond Pulling, MD; 11/12/4429 54:00 AM) UMBILICAL HERNIA WITHOUT OBSTRUCTION AND WITHOUT GANGRENE (K42.9) MORBID OBESITY (E66.01)  Past Surgical History Randall Hiss M. Redmond Pulling, MD; 06/16/2017 9:28 AM) Knee Surgery Left.  Diagnostic Studies History Randall Hiss M. Redmond Pulling, MD; 06/16/2017 9:28 AM) Colonoscopy within last year  Allergies ([name], [place]; 06/16/2017 9:08 AM) Naproxen *ANALGESICS - ANTI-INFLAMMATORY* BEE VENOM Allergies Reconciled  Medication History ([name], [place]; 06/16/2017 9:09 AM) AndroGel Pump (20.25 MG/ACT(1.62%) Gel, Transdermal) Active. ClonazePAM (1MG  Tablet, Oral) Active. BuPROPion HCl ER (SR) (150MG  Tablet ER 12HR, Oral) Active. Fenofibrate (160MG  Tablet, Oral) Active. MetFORMIN HCl ER (500MG  Tablet ER 24HR, Oral) Active. Metoprolol Tartrate (25MG  Tablet, Oral) Active. Pravastatin Sodium (40MG  Tablet, Oral) Active. Sertraline HCl (100MG  Tablet, Oral) Active. TraZODone HCl (100MG  Tablet,  Oral) Active. Turmeric (Oral) Specific strength unknown - Active. Medications Reconciled  Social History Randall Hiss M. Redmond Pulling, MD; 06/16/2017 9:28 AM) Alcohol use Remotely quit alcohol use. Caffeine use Tea. No drug use Tobacco use Former smoker.  Family History Randall Hiss M. Redmond Pulling, MD; 06/16/2017 9:28 AM) Alcohol Abuse Father. Breast Cancer Mother. Cerebrovascular Accident Mother. Depression Daughter. Diabetes Mellitus Mother, Sister. Heart Disease Father. Heart disease in male family member before age 67 Heart disease in male family member before age 94 Hypertension Mother. Respiratory Condition Father, Mother. Seizure disorder Sister.  Other Problems Randall Hiss M. Redmond Pulling, MD; 06/16/2017 10:09 AM) Anxiety Disorder Arthritis Back Pain Diverticulosis Gastric Ulcer Gastroesophageal Reflux Disease Hypercholesterolemia Kidney Stone Thyroid Disease Sleep Apnea Diabetes Mellitus     Review of Systems Randall Hiss M. Yuta Cipollone MD; 06/16/2017 9:28 AM) General Present- Fatigue and Weight Gain. Not Present- Appetite Loss, Chills, Fever, Night Sweats and Weight Loss. Skin Present- Dryness. Not Present- Change in Wart/Mole, Hives, Jaundice, New Lesions, Non-Healing Wounds, Rash and Ulcer. HEENT Present- Wears glasses/contact lenses. Not Present- Earache, Hearing Loss, Hoarseness, Nose Bleed, Oral Ulcers, Ringing in the Ears, Seasonal Allergies, Sinus Pain, Sore Throat, Visual Disturbances and Yellow Eyes. Respiratory Not Present- Bloody sputum, Chronic Cough, Difficulty Breathing, Snoring and Wheezing. Breast Not Present- Breast Mass, Breast Pain, Nipple Discharge and Skin Changes. Cardiovascular Not Present- Chest Pain, Difficulty Breathing Lying Down, Leg Cramps, Palpitations, Rapid Heart Rate, Shortness of Breath and Swelling of Extremities. Gastrointestinal Present- Change in Bowel Habits and Indigestion. Not Present- Abdominal Pain, Bloating, Bloody Stool, Chronic diarrhea,  Constipation, Difficulty Swallowing, Excessive gas, Gets full quickly at meals, Hemorrhoids, Nausea, Rectal Pain and Vomiting. Male Genitourinary Present- Impotence. Not Present- Blood in Urine, Change in Urinary Stream, Frequency, Nocturia, Painful Urination,  Urgency and Urine Leakage. Musculoskeletal Present- Back Pain, Joint Pain, Joint Stiffness and Muscle Pain. Not Present- Muscle Weakness and Swelling of Extremities. Neurological Not Present- Decreased Memory, Fainting, Headaches, Numbness, Seizures, Tingling, Tremor, Trouble walking and Weakness. Psychiatric Present- Anxiety. Not Present- Bipolar, Change in Sleep Pattern, Depression, Fearful and Frequent crying. Endocrine Present- Heat Intolerance and New Diabetes. Not Present- Cold Intolerance, Excessive Hunger, Hair Changes and Hot flashes. Hematology Not Present- Blood Thinners, Easy Bruising, Excessive bleeding, Gland problems, HIV and Persistent Infections.  Vitals ([name] RMA; 06/16/2017 9:07 AM) 06/16/2017 9:07 AM Weight: 305.2 lb Height: 71in Body Surface Area: 2.52 m Body Mass Index: 42.57 kg/m  Pulse: 97 (Regular)  BP: 132/84 (Sitting, Left Arm, Standard)      Physical Exam Randall Hiss M. Alvaro Aungst MD; 06/16/2017 10:04 AM)  General Mental Status-Alert. General Appearance-Consistent with stated age. Hydration-Well hydrated. Voice-Normal. Note: obese, mostly central  Head and Neck Head-normocephalic, atraumatic with no lesions or palpable masses. Trachea-midline. Thyroid Gland Characteristics - normal size and consistency.  Eye Eyeball - Bilateral-Extraocular movements intact. Sclera/Conjunctiva - Bilateral-No scleral icterus.  Chest and Lung Exam Chest and lung exam reveals -quiet, even and easy respiratory effort with no use of accessory muscles and on auscultation, normal breath sounds, no adventitious sounds and normal vocal resonance. Inspection Chest Wall - Normal. Back -  normal.  Breast - Did not examine.  Cardiovascular Cardiovascular examination reveals -normal heart sounds, regular rate and rhythm with no murmurs and normal pedal pulses bilaterally.  Abdomen Inspection  Inspection of the abdomen reveals: Note: +umbilical hernia; prob 2 x2 cm; reducible. upper midline diastasis. Skin - Scar - no surgical scars. Palpation/Percussion Palpation and Percussion of the abdomen reveal - Soft, Non Tender, No Rebound tenderness, No Rigidity (guarding) and No hepatosplenomegaly. Auscultation Auscultation of the abdomen reveals - Bowel sounds normal.  Peripheral Vascular Upper Extremity Palpation - Pulses bilaterally normal.  Neurologic Neurologic evaluation reveals -alert and oriented x 3 with no impairment of recent or remote memory. Mental Status-Normal.  Neuropsychiatric The patient's mood and affect are described as -normal. Judgment and Insight-insight is appropriate concerning matters relevant to self.  Musculoskeletal Normal Exam - Left-Upper Extremity Strength Normal and Lower Extremity Strength Normal. Normal Exam - Right-Upper Extremity Strength Normal and Lower Extremity Strength Normal.  Lymphatic Head & Neck  General Head & Neck Lymphatics: Bilateral - Description - Normal. Axillary - Did not examine. Femoral & Inguinal - Did not examine.    Assessment & Plan Randall Hiss M. Cassandra Harbold MD; 11/14/9474 54:65 AM)  UMBILICAL HERNIA WITHOUT OBSTRUCTION AND WITHOUT GANGRENE (K42.9) Impression: We discussed the etiology of umbilical hernias. We discussed the signs and symptoms of incarceration and strangulation. The patient was given educational material. I also drew diagrams.  We discussed nonoperative and operative management. With respect to operative management, we discussed primary muscle repair versus laparoscopic-assisted repair with mesh underlay. Given his body weight and size of defect I recommended primary muscle repair with  mesh reinforcement.  We discussed the risk and benefits of surgery including but not limited to bleeding, infection, injury to surrounding structures, hernia recurrence, mesh complications, hematoma/seroma formation, blood clot formation, urinary retention, post operative ileus, general anesthesia risk, abdominal pain. We discussed the importance of avoiding heavy lifting and straining for a period of 4- 6 weeks.  The patient has elected to proceed with laparoscopic assisted REPAIR OF UMBILICAL HERNIA, with MESH.  I also answered his questions about mesh  Current Plans Pt Education - Pamphlet Given - Hernia  Surgery: discussed with patient and provided information.  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 We discussed the pros and cons of proceeding with repair at this time versus waiting after some weight loss. He feels that his current discomfort is interfering with his ability to lose weight and would like to proceed with repair at this time.  Leighton Ruff. Redmond Pulling, MD, FACS General, Bariatric, & Minimally Invasive Surgery [facility], [place]

## 2017-06-16 NOTE — H&P (Signed)
Stephen Franklin Documented: 06/16/2017 9:06 AM Location: [facility] Patient #: [id number] DOB: [birth year] Married / Language: Stephen Franklin / Race: White Male  History of Present Illness Stephen Franklin Whyte MD; 06/16/2017 10:06 AM) The patient is a 52 year old male who presents with an abdominal wall hernia. He is referred by Stephen Franklin, [facility] for evaluation of a umbilical hernia. He states that it has been present for at least 3 months. It bulges and protrudes and at times it is painful mainly with physical activity. It is also annoying. He denies any nausea, vomiting, diarrhea or constipation. He denies any prior abdominal surgery. He does use CPAP for sleep apnea. He does also have diabetes type 2. He states his last A1c was 6.3. He is a former smoker. He states he underwent a negative stress test last summer. He is trying to lose weight but he feels that the hernia is interfering with his ability to be physically active.   Problem List/Past Medical Stephen Hiss M. Stephen Pulling, MD; 07/19/8525 78:24 AM) UMBILICAL HERNIA WITHOUT OBSTRUCTION AND WITHOUT GANGRENE (K42.9) MORBID OBESITY (E66.01)  Past Surgical History Stephen Hiss M. Stephen Pulling, MD; 06/16/2017 9:28 AM) Knee Surgery Left.  Diagnostic Studies History Stephen Hiss M. Stephen Pulling, MD; 06/16/2017 9:28 AM) Colonoscopy within last year  Allergies (Stephen Franklin, Stephen Franklin; 06/16/2017 9:08 AM) Naproxen *ANALGESICS - ANTI-INFLAMMATORY* BEE VENOM Allergies Reconciled  Medication History (Stephen Franklin, Stephen Franklin; 06/16/2017 9:09 AM) AndroGel Pump (20.25 MG/ACT(1.62%) Gel, Transdermal) Active. ClonazePAM (1MG  Tablet, Oral) Active. BuPROPion HCl ER (SR) (150MG  Tablet ER 12HR, Oral) Active. Fenofibrate (160MG  Tablet, Oral) Active. MetFORMIN HCl ER (500MG  Tablet ER 24HR, Oral) Active. Metoprolol Tartrate (25MG  Tablet, Oral) Active. Pravastatin Sodium (40MG  Tablet, Oral) Active. Sertraline HCl (100MG  Tablet, Oral) Active. TraZODone HCl (100MG  Tablet,  Oral) Active. Turmeric (Oral) Specific strength unknown - Active. Medications Reconciled  Social History Stephen Hiss M. Stephen Pulling, MD; 06/16/2017 9:28 AM) Alcohol use Remotely quit alcohol use. Caffeine use Tea. No drug use Tobacco use Former smoker.  Family History Stephen Hiss M. Stephen Pulling, MD; 06/16/2017 9:28 AM) Alcohol Abuse Father. Breast Cancer Mother. Cerebrovascular Accident Mother. Depression Daughter. Diabetes Mellitus Mother, Sister. Heart Disease Father. Heart disease in male family member before age 2 Heart disease in male family member before age 23 Hypertension Mother. Respiratory Condition Father, Mother. Seizure disorder Sister.  Other Problems Stephen Hiss M. Stephen Pulling, MD; 06/16/2017 10:09 AM) Anxiety Disorder Arthritis Back Pain Diverticulosis Gastric Ulcer Gastroesophageal Reflux Disease Hypercholesterolemia Kidney Stone Thyroid Disease Sleep Apnea Diabetes Mellitus     Review of Systems Stephen Hiss M. Sophie Tamez MD; 06/16/2017 9:28 AM) General Present- Fatigue and Weight Gain. Not Present- Appetite Loss, Chills, Fever, Night Sweats and Weight Loss. Skin Present- Dryness. Not Present- Change in Wart/Mole, Hives, Jaundice, New Lesions, Non-Healing Wounds, Rash and Ulcer. HEENT Present- Wears glasses/contact lenses. Not Present- Earache, Hearing Loss, Hoarseness, Nose Bleed, Oral Ulcers, Ringing in the Ears, Seasonal Allergies, Sinus Pain, Sore Throat, Visual Disturbances and Yellow Eyes. Respiratory Not Present- Bloody sputum, Chronic Cough, Difficulty Breathing, Snoring and Wheezing. Breast Not Present- Breast Mass, Breast Pain, Nipple Discharge and Skin Changes. Cardiovascular Not Present- Chest Pain, Difficulty Breathing Lying Down, Leg Cramps, Palpitations, Rapid Heart Rate, Shortness of Breath and Swelling of Extremities. Gastrointestinal Present- Change in Bowel Habits and Indigestion. Not Present- Abdominal Pain, Bloating, Bloody Stool, Chronic diarrhea,  Constipation, Difficulty Swallowing, Excessive gas, Gets full quickly at meals, Hemorrhoids, Nausea, Rectal Pain and Vomiting. Male Genitourinary Present- Impotence. Not Present- Blood in Urine, Change in Urinary Stream, Frequency, Nocturia, Painful Urination,  Urgency and Urine Leakage. Musculoskeletal Present- Back Pain, Joint Pain, Joint Stiffness and Muscle Pain. Not Present- Muscle Weakness and Swelling of Extremities. Neurological Not Present- Decreased Memory, Fainting, Headaches, Numbness, Seizures, Tingling, Tremor, Trouble walking and Weakness. Psychiatric Present- Anxiety. Not Present- Bipolar, Change in Sleep Pattern, Depression, Fearful and Frequent crying. Endocrine Present- Heat Intolerance and New Diabetes. Not Present- Cold Intolerance, Excessive Hunger, Hair Changes and Hot flashes. Hematology Not Present- Blood Thinners, Easy Bruising, Excessive bleeding, Gland problems, HIV and Persistent Infections.  Vitals (Stephen Franklin; 06/16/2017 9:07 AM) 06/16/2017 9:07 AM Weight: 305.2 lb Height: 71in Body Surface Area: 2.52 m Body Mass Index: 42.57 kg/m  Pulse: 97 (Regular)  BP: 132/84 (Sitting, Left Arm, Standard)      Physical Exam Stephen Hiss M. Alanie Syler MD; 06/16/2017 10:04 AM)  General Mental Status-Alert. General Appearance-Consistent with stated age. Hydration-Well hydrated. Voice-Normal. Note: obese, mostly central  Head and Neck Head-normocephalic, atraumatic with no lesions or palpable masses. Trachea-midline. Thyroid Gland Characteristics - normal size and consistency.  Eye Eyeball - Bilateral-Extraocular movements intact. Sclera/Conjunctiva - Bilateral-No scleral icterus.  Chest and Lung Exam Chest and lung exam reveals -quiet, even and easy respiratory effort with no use of accessory muscles and on auscultation, normal breath sounds, no adventitious sounds and normal vocal resonance. Inspection Chest Wall - Normal. Back -  normal.  Breast - Did not examine.  Cardiovascular Cardiovascular examination reveals -normal heart sounds, regular rate and rhythm with no murmurs and normal pedal pulses bilaterally.  Abdomen Inspection  Inspection of the abdomen reveals: Note: +umbilical hernia; prob 2 x2 cm; reducible. upper midline diastasis. Skin - Scar - no surgical scars. Palpation/Percussion Palpation and Percussion of the abdomen reveal - Soft, Non Tender, No Rebound tenderness, No Rigidity (guarding) and No hepatosplenomegaly. Auscultation Auscultation of the abdomen reveals - Bowel sounds normal.  Peripheral Vascular Upper Extremity Palpation - Pulses bilaterally normal.  Neurologic Neurologic evaluation reveals -alert and oriented x 3 with no impairment of recent or remote memory. Mental Status-Normal.  Neuropsychiatric The patient's mood and affect are described as -normal. Judgment and Insight-insight is appropriate concerning matters relevant to self.  Musculoskeletal Normal Exam - Left-Upper Extremity Strength Normal and Lower Extremity Strength Normal. Normal Exam - Right-Upper Extremity Strength Normal and Lower Extremity Strength Normal.  Lymphatic Head & Neck  General Head & Neck Lymphatics: Bilateral - Description - Normal. Axillary - Did not examine. Femoral & Inguinal - Did not examine.    Assessment & Plan Stephen Hiss M. Lisbeth Puller MD; 06/22/2991 71:69 AM)  UMBILICAL HERNIA WITHOUT OBSTRUCTION AND WITHOUT GANGRENE (K42.9) Impression: We discussed the etiology of umbilical hernias. We discussed the signs and symptoms of incarceration and strangulation. The patient was given educational material. I also drew diagrams.  We discussed nonoperative and operative management. With respect to operative management, we discussed primary muscle repair versus laparoscopic-assisted repair with mesh underlay. Given his body weight and size of defect I recommended primary muscle repair with  mesh reinforcement.  We discussed the risk and benefits of surgery including but not limited to bleeding, infection, injury to surrounding structures, hernia recurrence, mesh complications, hematoma/seroma formation, blood clot formation, urinary retention, post operative ileus, general anesthesia risk, abdominal pain. We discussed the importance of avoiding heavy lifting and straining for a period of 4- 6 weeks.  The patient has elected to proceed with laparoscopic assisted REPAIR OF UMBILICAL HERNIA, with MESH.  I also answered his questions about mesh  Current Plans Pt Education - Pamphlet Given - Hernia  Surgery: discussed with patient and provided information.  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 We discussed the pros and cons of proceeding with repair at this time versus waiting after some weight loss. He feels that his current discomfort is interfering with his ability to lose weight and would like to proceed with repair at this time.  Leighton Ruff. Stephen Pulling, MD, FACS General, Bariatric, & Minimally Invasive Surgery [facility], [place]

## 2017-06-21 ENCOUNTER — Encounter (HOSPITAL_COMMUNITY)
Admission: RE | Admit: 2017-06-21 | Discharge: 2017-06-21 | Disposition: A | Discharge status: Home / Self Care | Payer: Medicare PPO | Source: Ambulatory Visit | Attending: General Surgery | Admitting: General Surgery

## 2017-06-21 ENCOUNTER — Encounter (HOSPITAL_COMMUNITY): Payer: Self-pay

## 2017-06-21 ENCOUNTER — Other Ambulatory Visit: Payer: Self-pay

## 2017-06-21 DIAGNOSIS — E7800 Pure hypercholesterolemia, unspecified: Secondary | ICD-10-CM | POA: Diagnosis not present

## 2017-06-21 DIAGNOSIS — K219 Gastro-esophageal reflux disease without esophagitis: Secondary | ICD-10-CM | POA: Diagnosis not present

## 2017-06-21 DIAGNOSIS — F1920 Other psychoactive substance dependence, uncomplicated: Secondary | ICD-10-CM | POA: Diagnosis not present

## 2017-06-21 DIAGNOSIS — Z7984 Long term (current) use of oral hypoglycemic drugs: Secondary | ICD-10-CM | POA: Diagnosis not present

## 2017-06-21 DIAGNOSIS — G4733 Obstructive sleep apnea (adult) (pediatric): Secondary | ICD-10-CM | POA: Diagnosis not present

## 2017-06-21 DIAGNOSIS — K429 Umbilical hernia without obstruction or gangrene: Secondary | ICD-10-CM | POA: Diagnosis present

## 2017-06-21 DIAGNOSIS — E119 Type 2 diabetes mellitus without complications: Secondary | ICD-10-CM | POA: Diagnosis not present

## 2017-06-21 DIAGNOSIS — Z87891 Personal history of nicotine dependence: Secondary | ICD-10-CM | POA: Diagnosis not present

## 2017-06-21 DIAGNOSIS — Z79899 Other long term (current) drug therapy: Secondary | ICD-10-CM | POA: Diagnosis not present

## 2017-06-21 DIAGNOSIS — E079 Disorder of thyroid, unspecified: Secondary | ICD-10-CM | POA: Diagnosis not present

## 2017-06-21 DIAGNOSIS — Z6841 Body Mass Index (BMI) 40.0 and over, adult: Secondary | ICD-10-CM | POA: Diagnosis not present

## 2017-06-21 HISTORY — DX: Cardiac arrhythmia, unspecified: I49.9

## 2017-06-21 HISTORY — DX: Hypothyroidism, unspecified: E03.9

## 2017-06-21 HISTORY — DX: Sleep apnea, unspecified: G47.30

## 2017-06-21 HISTORY — DX: Anxiety disorder, unspecified: F41.9

## 2017-06-21 HISTORY — DX: Personal history of urinary calculi: Z87.442

## 2017-06-21 HISTORY — DX: Gastro-esophageal reflux disease without esophagitis: K21.9

## 2017-06-21 HISTORY — DX: Type 2 diabetes mellitus without complications: E11.9

## 2017-06-21 LAB — CBC
HEMATOCRIT: 43.5 % (ref 39.0–52.0)
HEMOGLOBIN: 14.5 g/dL (ref 13.0–17.0)
MCH: 31.9 pg (ref 26.0–34.0)
MCHC: 33.3 g/dL (ref 30.0–36.0)
MCV: 95.6 fL (ref 78.0–100.0)
Platelets: 192 10*3/uL (ref 150–400)
RBC: 4.55 MIL/uL (ref 4.22–5.81)
RDW: 12.9 % (ref 11.5–15.5)
WBC: 5.5 10*3/uL (ref 4.0–10.5)

## 2017-06-21 LAB — BASIC METABOLIC PANEL
ANION GAP: 6 (ref 5–15)
BUN: 15 mg/dL (ref 6–20)
CO2: 24 mmol/L (ref 22–32)
Calcium: 8.7 mg/dL — ABNORMAL LOW (ref 8.9–10.3)
Chloride: 107 mmol/L (ref 101–111)
Creatinine, Ser: 1.21 mg/dL (ref 0.61–1.24)
GFR calc Af Amer: >60 mL/min (ref >60)
Glucose, Bld: 113 mg/dL — ABNORMAL HIGH (ref 65–99)
POTASSIUM: 4.2 mmol/L (ref 3.5–5.1)
SODIUM: 137 mmol/L (ref 135–145)

## 2017-06-21 LAB — GLUCOSE, CAPILLARY: Glucose-Capillary: 109 mg/dL — ABNORMAL HIGH (ref 65–99)

## 2017-06-21 LAB — HEMOGLOBIN A1C
Hgb A1c MFr Bld: 6 % — ABNORMAL HIGH (ref 4.8–5.6)
MEAN PLASMA GLUCOSE: 125.5 mg/dL

## 2017-06-22 MED ORDER — DEXTROSE 5 % IV SOLN
3.0000 g | INTRAVENOUS | Status: AC
  Administered 2017-06-23: 3 g via INTRAVENOUS
  Filled 2017-06-22: qty 3

## 2017-06-23 ENCOUNTER — Encounter (HOSPITAL_COMMUNITY): Payer: Self-pay | Admitting: *Deleted

## 2017-06-23 ENCOUNTER — Ambulatory Visit (HOSPITAL_COMMUNITY): Payer: Medicare PPO | Admitting: Anesthesiology

## 2017-06-23 ENCOUNTER — Other Ambulatory Visit: Payer: Self-pay

## 2017-06-23 ENCOUNTER — Encounter (HOSPITAL_COMMUNITY)
Admission: RE | Disposition: A | Discharge status: Home / Self Care | Payer: Self-pay | Source: Ambulatory Visit | Attending: General Surgery

## 2017-06-23 ENCOUNTER — Observation Stay (HOSPITAL_COMMUNITY)
Admission: RE | Admit: 2017-06-23 | Discharge: 2017-06-24 | Disposition: A | Discharge status: Home / Self Care | Payer: Medicare PPO | Source: Ambulatory Visit | Attending: General Surgery | Admitting: General Surgery

## 2017-06-23 DIAGNOSIS — E119 Type 2 diabetes mellitus without complications: Secondary | ICD-10-CM | POA: Diagnosis not present

## 2017-06-23 DIAGNOSIS — F1920 Other psychoactive substance dependence, uncomplicated: Secondary | ICD-10-CM | POA: Diagnosis not present

## 2017-06-23 DIAGNOSIS — E079 Disorder of thyroid, unspecified: Secondary | ICD-10-CM | POA: Insufficient documentation

## 2017-06-23 DIAGNOSIS — Z79899 Other long term (current) drug therapy: Secondary | ICD-10-CM | POA: Insufficient documentation

## 2017-06-23 DIAGNOSIS — K219 Gastro-esophageal reflux disease without esophagitis: Secondary | ICD-10-CM | POA: Insufficient documentation

## 2017-06-23 DIAGNOSIS — G4733 Obstructive sleep apnea (adult) (pediatric): Secondary | ICD-10-CM | POA: Insufficient documentation

## 2017-06-23 DIAGNOSIS — K429 Umbilical hernia without obstruction or gangrene: Secondary | ICD-10-CM | POA: Diagnosis not present

## 2017-06-23 DIAGNOSIS — E7800 Pure hypercholesterolemia, unspecified: Secondary | ICD-10-CM | POA: Insufficient documentation

## 2017-06-23 DIAGNOSIS — Z6841 Body Mass Index (BMI) 40.0 and over, adult: Secondary | ICD-10-CM | POA: Insufficient documentation

## 2017-06-23 DIAGNOSIS — E669 Obesity, unspecified: Secondary | ICD-10-CM

## 2017-06-23 DIAGNOSIS — Z87891 Personal history of nicotine dependence: Secondary | ICD-10-CM | POA: Insufficient documentation

## 2017-06-23 DIAGNOSIS — Z7984 Long term (current) use of oral hypoglycemic drugs: Secondary | ICD-10-CM | POA: Insufficient documentation

## 2017-06-23 DIAGNOSIS — E1169 Type 2 diabetes mellitus with other specified complication: Secondary | ICD-10-CM

## 2017-06-23 HISTORY — PX: UMBILICAL HERNIA REPAIR: SHX196

## 2017-06-23 LAB — GLUCOSE, CAPILLARY
GLUCOSE-CAPILLARY: 218 mg/dL — AB (ref 65–99)
GLUCOSE-CAPILLARY: 112 mg/dL — AB (ref 65–99)
GLUCOSE-CAPILLARY: 148 mg/dL — AB (ref 65–99)
GLUCOSE-CAPILLARY: 142 mg/dL — AB (ref 65–99)

## 2017-06-23 SURGERY — REPAIR, HERNIA, UMBILICAL, LAPAROSCOPIC
Anesthesia: General | Site: Abdomen

## 2017-06-23 MED ORDER — FENTANYL CITRATE (PF) 100 MCG/2ML IJ SOLN
INTRAMUSCULAR | Status: AC
  Filled 2017-06-23: qty 2

## 2017-06-23 MED ORDER — HEPARIN SODIUM (PORCINE) 5000 UNIT/ML IJ SOLN
5000.0000 [IU] | Freq: Once | INTRAMUSCULAR | Status: AC
  Administered 2017-06-23: 5000 [IU] via SUBCUTANEOUS
  Filled 2017-06-23: qty 1

## 2017-06-23 MED ORDER — METOPROLOL TARTRATE 25 MG PO TABS
25.0000 mg | ORAL_TABLET | Freq: Once | ORAL | Status: AC
  Administered 2017-06-23: 25 mg via ORAL
  Filled 2017-06-23: qty 1

## 2017-06-23 MED ORDER — METOPROLOL TARTRATE 12.5 MG HALF TABLET: 12.5000 mg | ORAL_TABLET | Freq: Every day | ORAL | Status: DC | PRN

## 2017-06-23 MED ORDER — FENTANYL CITRATE (PF) 100 MCG/2ML IJ SOLN
INTRAMUSCULAR | Status: DC | PRN
  Administered 2017-06-23: 100 ug via INTRAVENOUS

## 2017-06-23 MED ORDER — MIDAZOLAM HCL 2 MG/2ML IJ SOLN
2.0000 mg | INTRAMUSCULAR | Status: DC | PRN
  Administered 2017-06-23: 2 mg via INTRAVENOUS

## 2017-06-23 MED ORDER — OXYCODONE HCL 5 MG/5ML PO SOLN
5.0000 mg | Freq: Once | ORAL | Status: DC | PRN
  Filled 2017-06-23: qty 5

## 2017-06-23 MED ORDER — FENTANYL CITRATE (PF) 100 MCG/2ML IJ SOLN
50.0000 ug | Freq: Once | INTRAMUSCULAR | Status: AC
Start: 2017-06-23 — End: 2017-06-23
  Administered 2017-06-23: 50 ug via INTRAVENOUS

## 2017-06-23 MED ORDER — TRAZODONE HCL 100 MG PO TABS
100.0000 mg | ORAL_TABLET | Freq: Every day | ORAL | Status: DC
  Administered 2017-06-23: 100 mg via ORAL
  Filled 2017-06-23: qty 1

## 2017-06-23 MED ORDER — LACTATED RINGERS IV SOLN
INTRAVENOUS | Status: DC
  Administered 2017-06-23 (×2): via INTRAVENOUS

## 2017-06-23 MED ORDER — SERTRALINE HCL 100 MG PO TABS
200.0000 mg | ORAL_TABLET | Freq: Every day | ORAL | Status: DC
  Administered 2017-06-23 – 2017-06-24 (×2): 200 mg via ORAL
  Filled 2017-06-23 (×2): qty 2

## 2017-06-23 MED ORDER — MIDAZOLAM HCL 2 MG/2ML IJ SOLN
INTRAMUSCULAR | Status: DC | PRN
  Administered 2017-06-23: 2 mg via INTRAVENOUS

## 2017-06-23 MED ORDER — METOPROLOL TARTRATE 25 MG PO TABS
25.0000 mg | ORAL_TABLET | Freq: Every day | ORAL | Status: DC
  Administered 2017-06-23 – 2017-06-24 (×2): 25 mg via ORAL
  Filled 2017-06-23 (×2): qty 1

## 2017-06-23 MED ORDER — FENTANYL CITRATE (PF) 100 MCG/2ML IJ SOLN
25.0000 ug | INTRAMUSCULAR | Status: DC | PRN
  Administered 2017-06-23 (×3): 50 ug via INTRAVENOUS

## 2017-06-23 MED ORDER — ONDANSETRON HCL 4 MG/2ML IJ SOLN: 4.0000 mg | Freq: Four times a day (QID) | INTRAMUSCULAR | Status: DC | PRN

## 2017-06-23 MED ORDER — CEFAZOLIN SODIUM-DEXTROSE 2-4 GM/100ML-% IV SOLN
INTRAVENOUS | Status: AC
  Filled 2017-06-23: qty 100

## 2017-06-23 MED ORDER — LIDOCAINE 2% (20 MG/ML) 5 ML SYRINGE
INTRAMUSCULAR | Status: DC | PRN
  Administered 2017-06-23: 100 mg via INTRAVENOUS

## 2017-06-23 MED ORDER — BUPIVACAINE LIPOSOME 1.3 % IJ SUSP
20.0000 mL | Freq: Once | INTRAMUSCULAR | Status: AC
  Administered 2017-06-23: 20 mL
  Filled 2017-06-23: qty 20

## 2017-06-23 MED ORDER — BUPIVACAINE-EPINEPHRINE 0.25% -1:200000 IJ SOLN
INTRAMUSCULAR | Status: AC
  Filled 2017-06-23: qty 1

## 2017-06-23 MED ORDER — MIDAZOLAM HCL 2 MG/2ML IJ SOLN
INTRAMUSCULAR | Status: AC
  Filled 2017-06-23: qty 4

## 2017-06-23 MED ORDER — PANTOPRAZOLE SODIUM 40 MG IV SOLR
40.0000 mg | Freq: Every day | INTRAVENOUS | Status: DC
  Administered 2017-06-23: 40 mg via INTRAVENOUS
  Filled 2017-06-23: qty 40

## 2017-06-23 MED ORDER — CLONAZEPAM 1 MG PO TABS: 1.0000 mg | ORAL_TABLET | Freq: Three times a day (TID) | ORAL | Status: DC | PRN

## 2017-06-23 MED ORDER — PROPOFOL 10 MG/ML IV BOLUS
INTRAVENOUS | Status: DC | PRN
  Administered 2017-06-23: 200 mg via INTRAVENOUS
  Administered 2017-06-23: 50 mg via INTRAVENOUS

## 2017-06-23 MED ORDER — ONDANSETRON 4 MG PO TBDP: 4.0000 mg | ORAL_TABLET | Freq: Four times a day (QID) | ORAL | Status: DC | PRN

## 2017-06-23 MED ORDER — PROPOFOL 10 MG/ML IV BOLUS
INTRAVENOUS | Status: AC
  Filled 2017-06-23: qty 20

## 2017-06-23 MED ORDER — OXYCODONE HCL 5 MG PO TABS
5.0000 mg | ORAL_TABLET | ORAL | Status: DC | PRN
  Administered 2017-06-23: 10 mg via ORAL
  Filled 2017-06-23: qty 2

## 2017-06-23 MED ORDER — DEXAMETHASONE SODIUM PHOSPHATE 10 MG/ML IJ SOLN
INTRAMUSCULAR | Status: DC | PRN
  Administered 2017-06-23: 10 mg via INTRAVENOUS

## 2017-06-23 MED ORDER — SUCCINYLCHOLINE CHLORIDE 200 MG/10ML IV SOSY
PREFILLED_SYRINGE | INTRAVENOUS | Status: DC | PRN
  Administered 2017-06-23: 160 mg via INTRAVENOUS

## 2017-06-23 MED ORDER — OXYCODONE HCL 5 MG PO TABS: 5.0000 mg | ORAL_TABLET | Freq: Once | ORAL | Status: DC | PRN

## 2017-06-23 MED ORDER — BUPIVACAINE-EPINEPHRINE 0.25% -1:200000 IJ SOLN
INTRAMUSCULAR | Status: DC | PRN
  Administered 2017-06-23: 50 mL

## 2017-06-23 MED ORDER — INSULIN ASPART 100 UNIT/ML ~~LOC~~ SOLN
0.0000 [IU] | Freq: Three times a day (TID) | SUBCUTANEOUS | Status: DC
  Administered 2017-06-23: 3 [IU] via SUBCUTANEOUS

## 2017-06-23 MED ORDER — METHOCARBAMOL 500 MG PO TABS: 500.0000 mg | ORAL_TABLET | Freq: Four times a day (QID) | ORAL | Status: DC | PRN

## 2017-06-23 MED ORDER — DOCUSATE SODIUM 100 MG PO CAPS
100.0000 mg | ORAL_CAPSULE | Freq: Two times a day (BID) | ORAL | Status: DC
  Administered 2017-06-23 – 2017-06-24 (×2): 100 mg via ORAL
  Filled 2017-06-23 (×2): qty 1

## 2017-06-23 MED ORDER — ROCURONIUM BROMIDE 50 MG/5ML IV SOSY
PREFILLED_SYRINGE | INTRAVENOUS | Status: DC | PRN
  Administered 2017-06-23: 10 mg via INTRAVENOUS
  Administered 2017-06-23: 50 mg via INTRAVENOUS

## 2017-06-23 MED ORDER — ONDANSETRON HCL 4 MG/2ML IJ SOLN
INTRAMUSCULAR | Status: DC | PRN
  Administered 2017-06-23: 4 mg via INTRAVENOUS

## 2017-06-23 MED ORDER — ACETAMINOPHEN 500 MG PO TABS
1000.0000 mg | ORAL_TABLET | ORAL | Status: AC
  Administered 2017-06-23: 1000 mg via ORAL
  Filled 2017-06-23: qty 2

## 2017-06-23 MED ORDER — ENOXAPARIN SODIUM 40 MG/0.4ML ~~LOC~~ SOLN
40.0000 mg | SUBCUTANEOUS | Status: DC
  Administered 2017-06-23: 40 mg via SUBCUTANEOUS
  Filled 2017-06-23: qty 0.4

## 2017-06-23 MED ORDER — GABAPENTIN 300 MG PO CAPS
300.0000 mg | ORAL_CAPSULE | ORAL | Status: AC
  Administered 2017-06-23: 300 mg via ORAL
  Filled 2017-06-23: qty 1

## 2017-06-23 MED ORDER — SUGAMMADEX SODIUM 500 MG/5ML IV SOLN
INTRAVENOUS | Status: AC
  Filled 2017-06-23: qty 5

## 2017-06-23 MED ORDER — GABAPENTIN 300 MG PO CAPS
300.0000 mg | ORAL_CAPSULE | Freq: Two times a day (BID) | ORAL | Status: DC
  Administered 2017-06-23 – 2017-06-24 (×2): 300 mg via ORAL
  Filled 2017-06-23 (×2): qty 1

## 2017-06-23 MED ORDER — SUGAMMADEX SODIUM 200 MG/2ML IV SOLN
INTRAVENOUS | Status: DC | PRN
  Administered 2017-06-23: 100 mg via INTRAVENOUS
  Administered 2017-06-23: 200 mg via INTRAVENOUS

## 2017-06-23 MED ORDER — LIDOCAINE 2% (20 MG/ML) 5 ML SYRINGE
INTRAMUSCULAR | Status: AC
  Filled 2017-06-23: qty 5

## 2017-06-23 MED ORDER — METFORMIN HCL ER 500 MG PO TB24
500.0000 mg | ORAL_TABLET | Freq: Two times a day (BID) | ORAL | Status: DC
  Administered 2017-06-23 – 2017-06-24 (×2): 500 mg via ORAL
  Filled 2017-06-23 (×2): qty 1

## 2017-06-23 MED ORDER — SIMETHICONE 80 MG PO CHEW
40.0000 mg | CHEWABLE_TABLET | Freq: Four times a day (QID) | ORAL | Status: DC | PRN
  Administered 2017-06-23: 40 mg via ORAL
  Filled 2017-06-23: qty 1

## 2017-06-23 MED ORDER — CHLORHEXIDINE GLUCONATE 4 % EX LIQD: 60.0000 mL | Freq: Once | CUTANEOUS | Status: DC

## 2017-06-23 MED ORDER — ONDANSETRON HCL 4 MG/2ML IJ SOLN
INTRAMUSCULAR | Status: AC
  Filled 2017-06-23: qty 2

## 2017-06-23 MED ORDER — 0.9 % SODIUM CHLORIDE (POUR BTL) OPTIME
TOPICAL | Status: DC | PRN
  Administered 2017-06-23: 1000 mL

## 2017-06-23 MED ORDER — DEXAMETHASONE SODIUM PHOSPHATE 10 MG/ML IJ SOLN
INTRAMUSCULAR | Status: AC
  Filled 2017-06-23: qty 1

## 2017-06-23 MED ORDER — POTASSIUM CHLORIDE IN NACL 20-0.45 MEQ/L-% IV SOLN
INTRAVENOUS | Status: DC
  Administered 2017-06-23: 14:00:00 via INTRAVENOUS
  Filled 2017-06-23 (×2): qty 1000

## 2017-06-23 MED ORDER — LIDOCAINE 2% (20 MG/ML) 5 ML SYRINGE
INTRAMUSCULAR | Status: AC
  Filled 2017-06-23: qty 10

## 2017-06-23 MED ORDER — ACETAMINOPHEN 500 MG PO TABS
1000.0000 mg | ORAL_TABLET | Freq: Four times a day (QID) | ORAL | Status: DC
  Administered 2017-06-23 – 2017-06-24 (×2): 1000 mg via ORAL
  Filled 2017-06-23 (×2): qty 2

## 2017-06-23 MED ORDER — BUPROPION HCL ER (SR) 150 MG PO TB12
150.0000 mg | ORAL_TABLET | Freq: Every day | ORAL | Status: DC
  Administered 2017-06-23 – 2017-06-24 (×2): 150 mg via ORAL
  Filled 2017-06-23 (×2): qty 1

## 2017-06-23 MED ORDER — ROCURONIUM BROMIDE 50 MG/5ML IV SOSY
PREFILLED_SYRINGE | INTRAVENOUS | Status: AC
  Filled 2017-06-23: qty 5

## 2017-06-23 MED ORDER — LIDOCAINE 2% (20 MG/ML) 5 ML SYRINGE
INTRAMUSCULAR | Status: DC | PRN
  Administered 2017-06-23: 1.5 mg/kg/h via INTRAVENOUS

## 2017-06-23 MED ORDER — MORPHINE SULFATE (PF) 2 MG/ML IV SOLN: 1.0000 mg | INTRAVENOUS | Status: DC | PRN

## 2017-06-23 MED ORDER — MIDAZOLAM HCL 2 MG/2ML IJ SOLN
INTRAMUSCULAR | Status: AC
  Filled 2017-06-23: qty 2

## 2017-06-23 MED ORDER — IBUPROFEN 200 MG PO TABS
600.0000 mg | ORAL_TABLET | Freq: Three times a day (TID) | ORAL | Status: DC
  Administered 2017-06-23 – 2017-06-24 (×2): 600 mg via ORAL
  Filled 2017-06-23 (×2): qty 3

## 2017-06-23 SURGICAL SUPPLY — 43 items
APPLIER CLIP LOGIC TI 5 (MISCELLANEOUS) IMPLANT
BENZOIN TINCTURE PRP APPL 2/3 (GAUZE/BANDAGES/DRESSINGS) ×2 IMPLANT
BINDER ABDOMINAL 12 ML 46-62 (SOFTGOODS) ×2 IMPLANT
CABLE HIGH FREQUENCY MONO STRZ (ELECTRODE) ×2 IMPLANT
CHLORAPREP W/TINT 26ML (MISCELLANEOUS) ×2 IMPLANT
COVER SURGICAL LIGHT HANDLE (MISCELLANEOUS) ×2 IMPLANT
DECANTER SPIKE VIAL GLASS SM (MISCELLANEOUS) ×2 IMPLANT
DEVICE SECURE STRAP 25 ABSORB (INSTRUMENTS) ×2 IMPLANT
DEVICE TROCAR PUNCTURE CLOSURE (ENDOMECHANICALS) ×2 IMPLANT
DRAIN CHANNEL 19F RND (DRAIN) IMPLANT
DRAPE INCISE IOBAN 66X45 STRL (DRAPES) IMPLANT
DRAPE UTILITY XL STRL (DRAPES) IMPLANT
DRSG TEGADERM 4X4.75 (GAUZE/BANDAGES/DRESSINGS) ×2 IMPLANT
ELECT PENCIL ROCKER SW 15FT (MISCELLANEOUS) IMPLANT
ELECT REM PT RETURN 15FT ADLT (MISCELLANEOUS) ×2 IMPLANT
EVACUATOR SILICONE 100CC (DRAIN) IMPLANT
GAUZE SPONGE 2X2 8PLY STRL LF (GAUZE/BANDAGES/DRESSINGS) ×1 IMPLANT
GOWN STRL REUS W/TWL XL LVL3 (GOWN DISPOSABLE) ×6 IMPLANT
KIT BASIN OR (CUSTOM PROCEDURE TRAY) ×2 IMPLANT
L-HOOK LAP DISP 36CM (ELECTROSURGICAL)
LHOOK LAP DISP 36CM (ELECTROSURGICAL) IMPLANT
MARKER SKIN DUAL TIP RULER LAB (MISCELLANEOUS) ×2 IMPLANT
MESH PARIETEX 4.7 (Mesh General) ×2 IMPLANT
NEEDLE SPNL 22GX3.5 QUINCKE BK (NEEDLE) ×2 IMPLANT
PAD POSITIONING PINK XL (MISCELLANEOUS) IMPLANT
SCISSORS LAP 5X35 DISP (ENDOMECHANICALS) IMPLANT
SET IRRIG TUBING LAPAROSCOPIC (IRRIGATION / IRRIGATOR) IMPLANT
SHEARS HARMONIC ACE PLUS 36CM (ENDOMECHANICALS) IMPLANT
SLEEVE XCEL OPT CAN 5 100 (ENDOMECHANICALS) ×2 IMPLANT
SPONGE GAUZE 2X2 STER 10/PKG (GAUZE/BANDAGES/DRESSINGS) ×1
STRIP CLOSURE SKIN 1/2X4 (GAUZE/BANDAGES/DRESSINGS) ×2 IMPLANT
SUT ETHILON 2 0 PS N (SUTURE) IMPLANT
SUT MNCRL AB 4-0 PS2 18 (SUTURE) ×2 IMPLANT
SUT NOVA NAB DX-16 0-1 5-0 T12 (SUTURE) ×4 IMPLANT
SUT NOVA NAB GS-21 0 18 T12 DT (SUTURE) IMPLANT
SUT VIC AB 3-0 SH 18 (SUTURE) IMPLANT
SUT VIC AB 3-0 SH 8-18 (SUTURE) ×2 IMPLANT
TOWEL OR 17X26 10 PK STRL BLUE (TOWEL DISPOSABLE) ×2 IMPLANT
TOWEL OR NON WOVEN STRL DISP B (DISPOSABLE) ×2 IMPLANT
TRAY LAPAROSCOPIC (CUSTOM PROCEDURE TRAY) ×2 IMPLANT
TROCAR BLADELESS OPT 5 100 (ENDOMECHANICALS) ×2 IMPLANT
TROCAR XCEL NON-BLD 11X100MML (ENDOMECHANICALS) IMPLANT
TUBING INSUF HEATED (TUBING) ×2 IMPLANT

## 2017-06-23 NOTE — Transfer of Care (Signed)
Immediate Anesthesia Transfer of Care Note  Patient: Stephen Franklin  Procedure(s) Performed: LAPAROSCOPIC ASSISTED UMBILICAL HERNIA REPAIR WITH MESH (N/A Abdomen)  Patient Location: PACU  Anesthesia Type:General  Level of Consciousness: awake, alert , oriented and patient cooperative  Airway & Oxygen Therapy: Patient Spontanous Breathing and Patient connected to face mask oxygen  Post-op Assessment: Report given to RN and Post -op Vital signs reviewed and stable  Post vital signs: Reviewed and stable  Last Vitals:  Vitals:   06/23/17 0747  BP: 129/77  Pulse: 73  Resp: 18  Temp: 36.6 C  SpO2: 97%    Last Pain:  Vitals:   06/23/17 0747  TempSrc: Oral         Complications: No apparent anesthesia complications

## 2017-06-23 NOTE — Interval H&P Note (Signed)
History and Physical Interval Note:  06/23/2017 9:19 AM  Stephen Franklin  has presented today for surgery, with the diagnosis of UMBILICAL HERNIA  The various methods of treatment have been discussed with the patient and family. After consideration of risks, benefits and other options for treatment, the patient has consented to  Procedure(s): [place] (N/A) as a surgical intervention .  The patient's history has been reviewed, patient examined, no change in status, stable for surgery.  I have reviewed the patient's chart and labs.  Questions were answered to the patient's satisfaction.     Greer Pickerel

## 2017-06-23 NOTE — Discharge Instructions (Signed)
[facility]  UMBILICAL OR INGUINAL HERNIA REPAIR: POST OP INSTRUCTIONS  Always review your discharge instruction sheet given to you by the facility where your surgery was performed. IF YOU HAVE DISABILITY OR FAMILY LEAVE FORMS, YOU MUST BRING THEM TO THE OFFICE FOR PROCESSING.   DO NOT GIVE THEM TO YOUR DOCTOR.  1. First, Take acetaminophen (tylenol) and ibuprofen (motrin) regularly for pain control.  If you still have pain after taking ibuprofen and acetaminophen you may take your prescription pain medication.  Taking only the prescription pain medication will not be sufficient.  Take your usually prescribed medications unless otherwise directed. 2. If you need a refill on your pain medication, please contact your pharmacy.  They will contact our office to request authorization. Prescriptions will not be filled after 5 pm or on week-ends. 3. You should follow a light diet the first 24 hours after arrival home, such as soup and crackers, etc.  Be sure to include lots of fluids daily.  Resume your normal diet the day after surgery. 4. Most patients will experience some swelling and bruising around the umbilicus or in the groin and scrotum.  Ice packs and reclining will help.  Swelling and bruising can take several days to resolve.  5. It is common to experience some constipation if taking pain medication after surgery.  Increasing fluid intake and taking a stool softener (such as Colace) will usually help or prevent this problem from occurring.  A mild laxative (Milk of Magnesia or Miralax) should be taken according to package directions if there are no bowel movements after 48 hours. 6. Unless discharge instructions indicate otherwise, you may remove your bandages 48 hours after surgery, and you may shower at that time.  You may have steri-strips (small skin tapes) in place directly over the incision.  These strips should be left on the skin for 7-10 days.   7. ACTIVITIES:  You may  resume regular (light) daily activities beginning the next day--such as daily self-care, walking, climbing stairs--gradually increasing activities as tolerated.  You may have sexual intercourse when it is comfortable.  Refrain from any heavy lifting or straining until approved by your doctor. a. You may drive when you are no longer taking prescription pain medication, you can comfortably wear a seatbelt, and you can safely maneuver your car and apply brakes. b. RETURN TO WORK:  8. You should see your doctor in the office for a follow-up appointment approximately 2-3 weeks after your surgery.  Make sure that you call for this appointment within a day or two after you arrive home to insure a convenient appointment time. 9. OTHER INSTRUCTIONS: DO NOT LIFT/PUSH/PULL ANYTHING GREATER THAN 10 LBS FOR 4 WEEKS    WHEN TO CALL YOUR DOCTOR: 1. Fever over 101.0 2. Inability to urinate 3. Nausea and/or vomiting 4. Extreme swelling or bruising 5. Continued bleeding from incision. 6. Increased pain, redness, or drainage from the incision  The clinic staff is available to answer your questions during regular business hours.  Please dont hesitate to call and ask to speak to one of the nurses for clinical concerns.  If you have a medical emergency, go to the nearest emergency room or call 911.  A surgeon from [facility] is always on call at the hospital   [address] [place], [place], [place]  [address] ?  [address], [place], [place]   [address] [phone] ? [phone] ? FAX [phone] Web site: [email or web address]

## 2017-06-23 NOTE — Anesthesia Preprocedure Evaluation (Signed)
Anesthesia Evaluation  Patient identified by MRN, date of birth, ID band Patient awake    Reviewed: Allergy & Precautions, H&P , NPO status , Patient's Chart, lab work & pertinent test results  Airway Mallampati: II   Neck ROM: full    Dental   Pulmonary shortness of breath, sleep apnea , former smoker,    breath sounds clear to auscultation       Cardiovascular  Rhythm:regular Rate:Normal     Neuro/Psych  Headaches, PSYCHIATRIC DISORDERS Anxiety    GI/Hepatic GERD  ,  Endo/Other  diabetes, Type 2Hypothyroidism Morbid obesity  Renal/GU      Musculoskeletal   Abdominal   Peds  Hematology   Anesthesia Other Findings   Reproductive/Obstetrics                             Anesthesia Physical Anesthesia Plan  ASA: II  Anesthesia Plan: General   Post-op Pain Management:    Induction: Intravenous  PONV Risk Score and Plan: 2 and Ondansetron, Dexamethasone, Midazolam and Treatment may vary due to age or medical condition  Airway Management Planned: Oral ETT  Additional Equipment:   Intra-op Plan:   Post-operative Plan: Extubation in OR  Informed Consent: I have reviewed the patients History and Physical, chart, labs and discussed the procedure including the risks, benefits and alternatives for the proposed anesthesia with the patient or authorized representative who has indicated his/her understanding and acceptance.     Plan Discussed with: CRNA, Anesthesiologist and Surgeon  Anesthesia Plan Comments:         Anesthesia Quick Evaluation

## 2017-06-23 NOTE — Anesthesia Postprocedure Evaluation (Signed)
Anesthesia Post Note  Patient: Tripp Goins  Procedure(s) Performed: LAPAROSCOPIC ASSISTED UMBILICAL HERNIA REPAIR WITH MESH (N/A Abdomen)     Patient location during evaluation: PACU Anesthesia Type: General Level of consciousness: awake and alert Pain management: pain level controlled Vital Signs Assessment: post-procedure vital signs reviewed and stable Respiratory status: spontaneous breathing, nonlabored ventilation, respiratory function stable and patient connected to nasal cannula oxygen Cardiovascular status: blood pressure returned to baseline and stable Postop Assessment: no apparent nausea or vomiting Anesthetic complications: no    Last Vitals:  Vitals:   06/23/17 1230 06/23/17 1245  BP: 134/86 129/87  Pulse: 72 72  Resp: 16 16  Temp:    SpO2: 97% 98%    Last Pain:  Vitals:   06/23/17 1245  TempSrc:   PainSc: Asleep                 Kidada Ging S

## 2017-06-23 NOTE — Anesthesia Procedure Notes (Signed)
Procedure Name: Intubation Date/Time: 06/23/2017 9:41 AM Performed by: Dione Booze, CRNA Pre-anesthesia Checklist: Suction available, Patient being monitored, Emergency Drugs available and Patient identified Patient Re-evaluated:Patient Re-evaluated prior to induction Oxygen Delivery Method: Circle system utilized Induction Type: IV induction Ventilation: Two handed mask ventilation required and Oral airway inserted - appropriate to patient size Laryngoscope Size: Mac and 4 Grade View: Grade III Tube type: Oral Tube size: 7.5 mm Number of attempts: 1 Airway Equipment and Method: Stylet Placement Confirmation: ETT inserted through vocal cords under direct vision,  positive ETCO2 and breath sounds checked- equal and bilateral Secured at: 22 cm Tube secured with: Tape Dental Injury: Teeth and Oropharynx as per pre-operative assessment  Difficulty Due To: Difficulty was anticipated, Difficult Airway- due to large tongue and Difficult Airway- due to reduced neck mobility

## 2017-06-23 NOTE — Progress Notes (Signed)
RT set up patient for CPAP. Patient will self administer when ready.

## 2017-06-23 NOTE — Op Note (Signed)
Stephen Franklin [id number] 1965-07-25 06/23/2017  LAPAROSCOPIC ASSISTED REPAIR OF Umbilical Herniorrhaphy with Mesh Procedure Note  Indications: Symptomatic umbilical hernia   Pre-operative Diagnosis: umbilical hernia   Post-operative Diagnosis: umbilical hernia   Surgeon: Leighton Ruff. Redmond Pulling, MD FACS   Assistants: none   Anesthesia: General endotracheal anesthesia and Local anesthesia 0.25%marcaine with exparel epi   Procedure Details  The patient was seen in the Holding Room. The risks, benefits, complications, treatment options, and expected outcomes were discussed with the patient. The possibilities of reaction to medication, pulmonary aspiration, perforation of viscus, bleeding, recurrent infection, hernia recurrence, the need for additional procedures, failure to diagnose a condition, and creating a complication requiring transfusion or operation were discussed with the patient. The patient concurred with the proposed plan, giving informed consent. The site of surgery properly noted/marked.   The patient was taken to Operating Room # 8 at [facility], identified as Stephen Franklin  and the procedure verified as Umbilical Herniorrhaphy. A Time Out was held and the above information confirmed. The patient received IV antibiotics and ERAS meds prior to the procedure.   The patient was placed supine. After establishing general anesthesia, the abdomen was prepped and draped in standard fashion.  A 5 mm Optiview was used the cannulate the peritoneal cavity in the left upper quadrant below the costal margin.  Pneumoperitoneum was obtained by insufflating CO2, maintaining a maximum pressure of 15 mmHg.  The 5 mm 30-degree laparoscopic was inserted.  There were a few omental adhesions to the anterior abdominal wall in and around the hernia defect.  A 5-mm port was placed in the left lower quadrant.  Endoshears with cautery was used to dissect the omental adhesions away from the anterior abdominal wall.   We cleared the entire abdominal wall and were able to visualize 1 fascial defects. I visualized the optical entry site and there was no evidence of injury to surrounding structures.   At this point, I started the open portion of the case. A curvilinear infraumbilical incision was created. Dissection was carried down to the hernia sac located above the fascia and was mobilized from surrounding structures. Intact fascia was identified circumferentially around the defect. Skin and soft tissue was mobilized from the surface of the fascia in a circumferential manner. A finger sweep was performed underneath the fascia to ensure there were no adhesions to the anterior abdominal wall. The defect was 2 cm x 1.75 cm. A round 12 cm piece of parietex mesh was selected and a central 1-0 novafil stay suture was placed in the mesh. The mesh was then placed thru the defect. The fascia was then closed primarily over the mesh with 5 interrupted 1-0 novafil sutures.    I returned laparoscopically. The mesh was then unrolled.  The central stay suture were then pulled up through the midportion of the umbilicus using the Endo-close device.  This deployed the mesh widely over the fascial defect.  The Secure Strap device was then used to tack down the edges of the mesh at 1 cm intervals circumferentially. Exparel was placed pre-peritoneal space around the edge of mesh, umbilical hernia repair as well as a TAP block.  Pneumoperitoneum was released. The umbilical stalk was then tacked back down to the fascia with two 3-0 vicryl sutures. Hemostasis was confirmed. The soft tissue was irrigated and closed in layers with inverted interrupted 3-0 vicryl sutures for the deep dermis. The skin incision was closed with a 4-0 monocryl subcuticular closure. Steri-Strips followed by a 4x4  gauze and a tegaderm were applied at the end of the operation.   Instrument, sponge, and needle counts were correct prior to closure and at the conclusion of  the case.   Findings:  Type of repair - primary suture with mesh underlay  (choices - primary suture, mesh, or component)  Name of mesh - parietex  Size of mesh - 12 cm round  Mesh overlap - >5 cm  Placement of mesh -  beneath fascia and into peritoneal cavity,   Estimated Blood Loss: Minimal   Drains: none   Implants: 12 cm round parietex mesh   Complications: None; patient tolerated the procedure well.   Disposition: PACU - hemodynamically stable.   Condition: stable  Leighton Ruff. Redmond Pulling, MD, FACS General, Bariatric, & Minimally Invasive Surgery [facility], [place]

## 2017-06-23 NOTE — Progress Notes (Signed)
Pt complains of postop pain in groin and above penis. Pt was given pain medications and rates his pain at 4/5. Pt states that his surgical sites are fine, but the groin pain in unbearable.

## 2017-06-24 ENCOUNTER — Encounter (HOSPITAL_COMMUNITY): Payer: Self-pay | Admitting: General Surgery

## 2017-06-24 DIAGNOSIS — K429 Umbilical hernia without obstruction or gangrene: Secondary | ICD-10-CM | POA: Diagnosis not present

## 2017-06-24 LAB — GLUCOSE, CAPILLARY
GLUCOSE-CAPILLARY: 123 mg/dL — AB (ref 65–99)
GLUCOSE-CAPILLARY: 178 mg/dL — AB (ref 65–99)

## 2017-06-24 MED ORDER — OXYCODONE HCL 5 MG PO TABS: 5.0000 mg | ORAL_TABLET | ORAL | 0 refills | Status: DC | PRN

## 2017-06-24 NOTE — Discharge Summary (Signed)
Physician Discharge Summary  Stephen Franklin NLG:[id number] DOB: 01/20/1966 DOA: 06/23/2017  PCP: Starlyn Skeans, PA-C  Admit date: 06/23/2017 Discharge date:  06/24/2017   Recommendations for Outpatient Follow-up:    Follow-up Information    Greer Pickerel, MD. Schedule an appointment as soon as possible for a visit in 2 weeks.   Specialty:  General Surgery Why:  For wound re-check Contact information: [place] [address] [phone]          Discharge Diagnoses:  1. Obesity 2. osa on cpap 3. Diabetes mellitus 2 in obese 4. Umbilical hernia  Surgical Procedure: lap assisted umbilical hernia repair with mesh  Discharge Condition: good Disposition: home  Diet recommendation: carb modified  Filed Weights   06/23/17 0757  Weight: 136.1 kg (300 lb)    History of present illness:  The patient is a 52 year old male who presents with an abdominal wall hernia. He is referred by [name], [facility] for evaluation of a umbilical hernia. He states that it has been present for at least 3 months. It bulges and protrudes and at times it is painful mainly with physical activity. It is also annoying. He denies any nausea, vomiting, diarrhea or constipation. He denies any prior abdominal surgery. He does use CPAP for sleep apnea. He does also have diabetes type 2. He states his last A1c was 6.3. He is a former smoker. He states he underwent a negative stress test last summer. He is trying to lose weight but he feels that the hernia is interfering with his ability to be physically active.    Hospital Course:  Kept overnight for observation. Did well. Tolerating diet. Ambulating well. Pain controlled on oral meds. Didn't require iv meds overnight. Just has soreness.   BP 108/62 (BP Location: Left Arm)   Pulse 65   Temp 97.7 F (36.5 C) (Oral)   Resp 18   Ht 5\' 11"  (1.803 m)   Wt 136.1 kg (300 lb)   SpO2 98%   BMI 41.84 kg/m   Gen:  alert, NAD, non-toxic appearing Pupils: equal, no scleral icterus Pulm: Lungs clear to auscultation, symmetric chest rise CV: regular rate and rhythm Abd: soft,  approp mild tender, nondistended. +binder. No cellulitis. No incisional hernia Ext: no edema, no calf tenderness Skin: no rash, no jaundice    Discharge Instructions  Discharge Instructions    Call MD for:   Complete by:  As directed    Temperature >101   Call MD for:  hives   Complete by:  As directed    Call MD for:  persistant dizziness or light-headedness   Complete by:  As directed    Call MD for:  persistant nausea and vomiting   Complete by:  As directed    Call MD for:  redness, tenderness, or signs of infection (pain, swelling, redness, odor or green/yellow discharge around incision site)   Complete by:  As directed    Call MD for:  severe uncontrolled pain   Complete by:  As directed    Diet Carb Modified   Complete by:  As directed    Discharge instructions   Complete by:  As directed    See [facility] discharge instructions   Increase activity slowly   Complete by:  As directed      Allergies as of 06/24/2017      Reactions   Naproxen Sodium Hives, Shortness Of Breath, Swelling   Bee Venom Itching  Medication List    TAKE these medications   buPROPion 150 MG 12 hr tablet Commonly known as:  WELLBUTRIN SR Take 150 mg by mouth daily.   clonazePAM 1 MG tablet Commonly known as:  KLONOPIN Take 1 mg by mouth 3 (three) times daily as needed (for anxiety).   fenofibrate 160 MG tablet Take 160 mg by mouth daily.   Fish Oil 1000 MG Caps Take 1,000 mg by mouth daily.   ibuprofen 200 MG tablet Commonly known as:  ADVIL,MOTRIN Take 600 mg by mouth every 8 (eight) hours as needed (for back pain & muscle pain/headaches).   metFORMIN 500 MG 24 hr tablet Commonly known as:  GLUCOPHAGE-XR Take 500 mg by mouth 2 (two) times daily.   metoprolol tartrate 25 MG tablet Commonly known as:  LOPRESSOR Take  1 tablet (25 mg total) by mouth 2 (two) times daily. May take additional 12.5mg  daily as needed for palpitations What changed:    when to take this  additional instructions   oxyCODONE 5 MG immediate release tablet Commonly known as:  Oxy IR/ROXICODONE Take 1 tablet (5 mg total) by mouth every 4 (four) hours as needed for moderate pain.   pravastatin 40 MG tablet Commonly known as:  PRAVACHOL Take 40 mg by mouth daily.   sertraline 100 MG tablet Commonly known as:  ZOLOFT Take 200 mg by mouth daily.   Testosterone 20.25 MG/ACT (1.62%) Gel Apply 2 Pump topically daily. 1 pump applied to each shoulder every morning.   traZODone 100 MG tablet Commonly known as:  DESYREL Take 100 mg by mouth at bedtime.   TURMERIC PO Take 1,000 mg by mouth daily.      Follow-up Information    Greer Pickerel, MD. Schedule an appointment as soon as possible for a visit in 2 weeks.   Specialty:  General Surgery Why:  For wound re-check Contact information: [place] [address] [phone]            The results of significant diagnostics from this hospitalization (including imaging, microbiology, ancillary and laboratory) are listed below for reference.    Significant Diagnostic Studies: No results found.  Microbiology: No results found for this or any previous visit (from the past 240 hour(s)).   Labs: Basic Metabolic Panel: Recent Labs  Lab 06/21/17 1120  NA 137  K 4.2  CL 107  CO2 24  GLUCOSE 113*  BUN 15  CREATININE 1.21  CALCIUM 8.7*   Liver Function Tests: No results for input(s): AST, ALT, ALKPHOS, BILITOT, PROT, ALBUMIN in the last 168 hours. No results for input(s): LIPASE, AMYLASE in the last 168 hours. No results for input(s): AMMONIA in the last 168 hours. CBC: Recent Labs  Lab 06/21/17 1120  WBC 5.5  HGB 14.5  HCT 43.5  MCV 95.6  PLT 192   Cardiac Enzymes: No results for input(s): CKTOTAL, CKMB, CKMBINDEX, TROPONINI in  the last 168 hours. BNP: BNP (last 3 results) No results for input(s): BNP in the last 8760 hours.  ProBNP (last 3 results) No results for input(s): PROBNP in the last 8760 hours.  CBG: Recent Labs  Lab 06/23/17 0740 06/23/17 1109 06/23/17 1714 06/23/17 2200 06/24/17 0755  GLUCAP 218* 112* 148* 142* 123*    Active Problems:   Umbilical hernia   Time coordinating discharge: 15 min  Signed:  Gayland Curry, MD [facility], [place] [phone] 06/24/2017, 8:22 AM

## 2017-06-24 NOTE — Progress Notes (Signed)
Discharge instructions discussed with patient, verbalized agreement and understanding, prescriptions given to patient

## 2017-07-20 ENCOUNTER — Institutional Professional Consult (permissible substitution): Payer: Self-pay | Admitting: Neurology

## 2017-08-30 ENCOUNTER — Other Ambulatory Visit: Payer: Self-pay

## 2017-08-30 ENCOUNTER — Ambulatory Visit: Payer: Medicare PPO | Admitting: Neurology

## 2017-08-30 ENCOUNTER — Encounter: Payer: Self-pay | Admitting: Neurology

## 2017-08-30 VITALS — BP 116/68 | HR 88 | Resp 18 | Ht 71.0 in | Wt 305.5 lb

## 2017-08-30 DIAGNOSIS — Z6841 Body Mass Index (BMI) 40.0 and over, adult: Secondary | ICD-10-CM | POA: Diagnosis not present

## 2017-08-30 DIAGNOSIS — G4719 Other hypersomnia: Secondary | ICD-10-CM | POA: Diagnosis not present

## 2017-08-30 DIAGNOSIS — E349 Endocrine disorder, unspecified: Secondary | ICD-10-CM | POA: Diagnosis not present

## 2017-08-30 DIAGNOSIS — F418 Other specified anxiety disorders: Secondary | ICD-10-CM | POA: Diagnosis not present

## 2017-08-30 DIAGNOSIS — N529 Male erectile dysfunction, unspecified: Secondary | ICD-10-CM | POA: Diagnosis not present

## 2017-08-30 DIAGNOSIS — G4733 Obstructive sleep apnea (adult) (pediatric): Secondary | ICD-10-CM

## 2017-08-30 MED ORDER — BUSPIRONE HCL 30 MG PO TABS: 30.0000 mg | ORAL_TABLET | Freq: Two times a day (BID) | ORAL | 11 refills | Status: DC

## 2017-08-30 NOTE — Patient Instructions (Signed)
He has been to try to switch him from Zoloft over to [place].  Eventually the Zoloft will be stopped and the BuSpar dose will be 30 mg twice a day.  I would like you to switch as follows:  For 1 week reduce the Zoloft to 100 mg The next week, take BuSpar 15 mg twice a day and Zoloft 50 mg once a day The next week, increase the BuSpar to 30 mg twice a day and stop the Zoloft  If you feel that you did better on Zoloft you can always switch back.

## 2017-08-30 NOTE — Progress Notes (Signed)
[facility]  PATIENT: Stephen Franklin DOB: 10-Nov-1965  REFERRING DOCTOR OR PCP:  Starlyn Skeans, PA-C SOURCE: Patient, notes from [facility], downloads from the CPAP.  _________________________________   HISTORICAL  CHIEF COMPLAINT:  Chief Complaint  Patient presents with  . Sleep Apnea    Sts. is compliant with CPAP ([facility]); has had machine for 6-7 yrs., is sleeping all night but doesn't think quality of sleep is as good, is a little more fatigued during the day.  Not snoring./fim    HISTORY OF PRESENT ILLNESS:  I had the pleasure of seeing your patient, [name], [facility] for neurologic consultation regarding his sleep apnea  He was diagnosed with OSA in 2012 after presenting with snoring and EDS.   His first one was done at [facility] and he recalls an AHI = 24.   He was titrated to CPAP +14 cm.   He has excellent compliance (100%) and efficacy (30 day AHI = 2.4 today).     He feels much better the next day when he uses CPAP.   He feels the machine helped him more initially than it does now and he still has some EDS.     He does not snore through the night.  On a typical knight, he goes to bed 1030 pm and falls asleep in 10-15 minutes.    He does not wake up at night.  In the morning he feels fairly refreshed.     EPWORTH SLEEPINESS SCALE  On a scale of 0 - 3 what is the chance of dozing:  Sitting and Reading:   1 Watching TV:    1 Sitting inactive in a public place: 2 Passenger in car for one hour: 0 Lying down to rest in the afternoon: 3 Sitting and talking to someone: 0 Sitting quietly after lunch:  3 In a car, stopped in traffic:  0  Total (out of 24):    10/24  (mild excessive daytime sleepiness).     Health is fairly good.   BP is good.  He is on a beta blockers for occasional palpitations.   He has NIDDM.    He has had obesity x years.     He has ED most of the time.     Viagra has not helped.    Surgery was discussed with him by urology.          REVIEW OF SYSTEMS: Constitutional: No fevers, chills, sweats, or change in appetite Eyes: No visual changes, double vision, eye pain Ear, nose and throat: No hearing loss, ear pain, nasal congestion, sore throat Cardiovascular: No chest pain, palpitations Respiratory: No shortness of breath at rest or with exertion.   No wheezes GastrointestinaI: No nausea, vomiting, diarrhea, abdominal pain, fecal incontinence Genitourinary: No dysuria, urinary retention or frequency.  No nocturia. Musculoskeletal: No neck pain, back pain Integumentary: No rash, pruritus, skin lesions Neurological: as above Psychiatric: No depression at this time.  No anxiety Endocrine: No palpitations, diaphoresis, change in appetite, change in weigh or increased thirst Hematologic/Lymphatic: No anemia, purpura, petechiae. Allergic/Immunologic: No itchy/runny eyes, nasal congestion, recent allergic reactions, rashes  ALLERGIES: Allergies  Allergen Reactions  . Naproxen Sodium Hives, Shortness Of Breath and Swelling  . Bee Venom Itching    HOME MEDICATIONS:  Current Outpatient Medications:  .  buPROPion (WELLBUTRIN SR) 150 MG 12 hr tablet, Take 150 mg by mouth daily., Disp: , Rfl:  .  clonazePAM (KLONOPIN) 1 MG  tablet, Take 1 mg by mouth 3 (three) times daily as needed (for anxiety). , Disp: , Rfl:  .  fenofibrate 160 MG tablet, Take 160 mg by mouth daily., Disp: , Rfl: 3 .  ibuprofen (ADVIL,MOTRIN) 200 MG tablet, Take 600 mg by mouth every 8 (eight) hours as needed (for back pain & muscle pain/headaches)., Disp: , Rfl:  .  metFORMIN (GLUCOPHAGE-XR) 500 MG 24 hr tablet, Take 500 mg by mouth 2 (two) times daily., Disp: , Rfl: 3 .  metoprolol tartrate (LOPRESSOR) 25 MG tablet, Take 1 tablet (25 mg total) by mouth 2 (two) times daily. May take additional 12.5mg  daily as needed for palpitations (Patient taking differently: Take 25 mg by  mouth daily. ), Disp: 135 tablet, Rfl: 1 .  Omega-3 Fatty Acids (FISH OIL) 1000 MG CAPS, Take 1,000 mg by mouth daily. , Disp: , Rfl:  .  pravastatin (PRAVACHOL) 40 MG tablet, Take 40 mg by mouth daily., Disp: , Rfl: 3 .  sertraline (ZOLOFT) 100 MG tablet, Take 200 mg by mouth daily. , Disp: , Rfl:  .  Testosterone 20.25 MG/ACT (1.62%) GEL, Apply 2 Pump topically daily. 1 pump applied to each shoulder every morning., Disp: , Rfl: 5 .  traZODone (DESYREL) 100 MG tablet, Take 100 mg by mouth at bedtime. , Disp: , Rfl:  .  TURMERIC PO, Take 1,000 mg by mouth daily., Disp: , Rfl:  .  busPIRone (BUSPAR) 30 MG tablet, Take 1 tablet (30 mg total) by mouth 2 (two) times daily., Disp: 60 tablet, Rfl: 11 .  oxyCODONE (OXY IR/ROXICODONE) 5 MG immediate release tablet, Take 1 tablet (5 mg total) by mouth every 4 (four) hours as needed for moderate pain., Disp: 30 tablet, Rfl: 0  PAST MEDICAL HISTORY: Past Medical History:  Diagnosis Date  . Anxiety   . Diabetes mellitus without complication (HCC)    Type 2  . Dysrhythmia    palpitations  . GERD (gastroesophageal reflux disease)   . High cholesterol   . History of kidney stones   . Hypothyroidism    hx of correcte no meds now  . Sleep apnea    cpap    PAST SURGICAL HISTORY: Past Surgical History:  Procedure Laterality Date  . COLONOSCOPY    . HERNIA REPAIR     Left inguinal, umbilical with mesh 5-46-27 Dr. Redmond Pulling  . UMBILICAL HERNIA REPAIR N/A 06/23/2017   Procedure: LAPAROSCOPIC ASSISTED UMBILICAL HERNIA REPAIR WITH MESH;  Surgeon: Greer Pickerel, MD;  Location: [facility];  Service: General;  Laterality: N/A;    FAMILY HISTORY: Family History  Adopted: Yes  Problem Relation Age of Onset  . Heart failure Mother   . Breast cancer Mother   . Diabetes Mother   . Lung cancer Father     SOCIAL HISTORY:  Social History   Socioeconomic History  . Marital status: Married    Spouse name: Not on file  . Number of children: Not on file  .  Years of education: Not on file  . Highest education level: Not on file  Social Needs  . Financial resource strain: Not on file  . Food insecurity - worry: Not on file  . Food insecurity - inability: Not on file  . Transportation needs - medical: Not on file  . Transportation needs - non-medical: Not on file  Occupational History  . Not on file  Tobacco Use  . Smoking status: Former Smoker    Last attempt to quit: 06/21/2010  Years since quitting: 7.1  . Smokeless tobacco: Former Systems developer    Quit date: 06/13/2010  Substance and Sexual Activity  . Alcohol use: No    Comment: hx of none in 7-8 years  . Drug use: No  . Sexual activity: No  Other Topics Concern  . Not on file  Social History Narrative  . Not on file     PHYSICAL EXAM  Vitals:   08/30/17 1019  BP: 116/68  Pulse: 88  Resp: 18  Weight: (!) 305 lb 8 oz (138.6 kg)  Height: 5\' 11"  (1.803 m)    Body mass index is 42.61 kg/m.   General: The patient is an obese man in no acute distress  Eyes:  Funduscopic exam shows normal optic discs and retinal vessels.  Neck: The neck is supple, no carotid bruits are noted.  The neck is nontender.  Cardiovascular: The heart has a regular rate and rhythm with a normal S1 and S2. There were no murmurs, gallops or rubs. Lungs are clear to auscultation.  Skin: Extremities are without significant edema.  Musculoskeletal:  Back is nontender  Neurologic Exam  Mental status: The patient is alert and oriented x 3 at the time of the examination. The patient has apparent normal recent and remote memory, with an apparently normal attention span and concentration ability.   Speech is normal.  Cranial nerves: Extraocular movements are full. Pupils are equal, round, and reactive to light and accomodation.  Visual fields are full.  Facial symmetry is present. There is good facial sensation to soft touch bilaterally.Facial strength is normal.  Trapezius and sternocleidomastoid strength is  normal. No dysarthria is noted.  The tongue is midline, and the patient has symmetric elevation of the soft palate. No obvious hearing deficits are noted.  Motor:  Muscle bulk is normal.   Tone is normal. Strength is  5 / 5 in all 4 extremities.   Sensory: Sensory testing is intact to touch and vibration in the hands.  He has reduced sensation to vibration in the toes but normal sensation at the ankles.  .  Coordination: Cerebellar testing reveals good finger-nose-finger and heel-to-shin bilaterally.  Gait and station: Station is normal.   Gait is normal. Tandem gait is normal. Romberg is negative.   Reflexes: Deep tendon reflexes are symmetric and normal bilaterally.   Plantar responses are flexor.    DIAGNOSTIC DATA (LABS, IMAGING, TESTING) - I reviewed patient records, labs, notes, testing and imaging myself where available.  Lab Results  Component Value Date   WBC 5.5 06/21/2017   HGB 14.5 06/21/2017   HCT 43.5 06/21/2017   MCV 95.6 06/21/2017   PLT 192 06/21/2017      Component Value Date/Time   NA 137 06/21/2017 1120   K 4.2 06/21/2017 1120   CL 107 06/21/2017 1120   CO2 24 06/21/2017 1120   GLUCOSE 113 (H) 06/21/2017 1120   BUN 15 06/21/2017 1120   CREATININE 1.21 06/21/2017 1120   CALCIUM 8.7 (L) 06/21/2017 1120   PROT 7.3 08/22/2012 2023   ALBUMIN 3.9 08/22/2012 2023   AST 23 08/22/2012 2023   ALT 42 08/22/2012 2023   ALKPHOS 68 08/22/2012 2023   BILITOT 0.3 08/22/2012 2023   GFRNONAA >60 06/21/2017 1120   GFRAA >60 06/21/2017 1120   Lab Results  Component Value Date   CHOL 174 01/02/2010   HDL 30.80 (L) 01/02/2010   LDLDIRECT 95.1 01/02/2010   TRIG 298.0 (H) 01/02/2010   CHOLHDL 6 01/02/2010  Lab Results  Component Value Date   HGBA1C 6.0 (H) 06/21/2017   No results found for: VITAMINB12 Lab Results  Component Value Date   TSH 0.95 01/02/2010       ASSESSMENT AND PLAN  Obstructive sleep apnea  Depression with anxiety  Class 3 severe  obesity due to excess calories with serious comorbidity and body mass index (BMI) of 40.0 to 44.9 in adult [facility])  Hypotestosteronemia  Erectile dysfunction, unspecified erectile dysfunction type  Excessive daytime sleepiness  In summary, Mr. Kamau is a 52 year old man who has been on CPAP for 6-7 years due to moderate obstructive sleep apnea.  He is very compliant with his CPAP and has excellent efficacy with an AHI equals 2.4.  His current machine has broken button plus he does not feel he is getting the benefit that he used to.  He has had the machine for 6+ years.  We discussed that he would likely need to get a new machine.  We will check with [facility] to see if he is able to do that.  His initial study was May 2012 at [facility].  We can continue to write for CPAP supplies as needed.    He has some daytime sleepiness but he prefers not to start another medication for that..    He has ED and is on 2 medications, Zoloft and metoprolol that might be worsening that issue.  I will have him switch from Zoloft to BuSpar to see if symptoms improve.  He will return to see me in 1 year or sooner if there are new or worsening neurologic issues.  Thank you for asking me to see Mr. Prentiss.  Please let me know if I can be of further assistance with him or other patients in the future.     Richard A. Felecia Shelling, MD, [facility] 3/41/9379, 02:40 AM Certified in Neurology, Clinical Neurophysiology, Sleep Medicine, Pain Medicine and Neuroimaging  [facility] [address], [place], [place] [address] [phone]

## 2017-08-31 ENCOUNTER — Telehealth: Payer: Self-pay | Admitting: *Deleted

## 2017-08-31 DIAGNOSIS — G4733 Obstructive sleep apnea (adult) (pediatric): Secondary | ICD-10-CM

## 2017-08-31 DIAGNOSIS — R5383 Other fatigue: Secondary | ICD-10-CM

## 2017-08-31 DIAGNOSIS — Z9989 Dependence on other enabling machines and devices: Secondary | ICD-10-CM

## 2017-08-31 DIAGNOSIS — G479 Sleep disorder, unspecified: Secondary | ICD-10-CM

## 2017-08-31 NOTE — Telephone Encounter (Signed)
Order for new CPAP machine, and 08/30/17 ov notes faxed to [facility], fax# [phone]

## 2017-09-08 NOTE — Telephone Encounter (Signed)
Pt has called asking if RN Faith can fax over the CPAP Rx to [facility] to [phone]

## 2017-09-08 NOTE — Telephone Encounter (Signed)
Spoke with Stephen Franklin.  His current dme company is [employer]. CPAP orders have already been sent there.  If he would like to change companies, he will need to get his sleep study from [place]. This will have to be sent with new orders to his dme of choice. He verbalized understanding of same, will get sleep study from [facility] and let me know which dme company he wants orders sent to/fim

## 2017-09-14 DIAGNOSIS — G4733 Obstructive sleep apnea (adult) (pediatric): Secondary | ICD-10-CM | POA: Diagnosis not present

## 2017-09-15 ENCOUNTER — Encounter: Payer: Self-pay | Admitting: Neurology

## 2017-09-15 ENCOUNTER — Ambulatory Visit: Payer: Medicare HMO | Admitting: Neurology

## 2017-09-15 DIAGNOSIS — F418 Other specified anxiety disorders: Secondary | ICD-10-CM | POA: Diagnosis not present

## 2017-09-15 DIAGNOSIS — G4719 Other hypersomnia: Secondary | ICD-10-CM

## 2017-09-15 DIAGNOSIS — G4733 Obstructive sleep apnea (adult) (pediatric): Secondary | ICD-10-CM | POA: Diagnosis not present

## 2017-09-15 DIAGNOSIS — Z6841 Body Mass Index (BMI) 40.0 and over, adult: Secondary | ICD-10-CM | POA: Diagnosis not present

## 2017-09-15 DIAGNOSIS — R413 Other amnesia: Secondary | ICD-10-CM | POA: Diagnosis not present

## 2017-09-15 DIAGNOSIS — E66813 Obesity, class 3: Secondary | ICD-10-CM

## 2017-09-15 DIAGNOSIS — R69 Illness, unspecified: Secondary | ICD-10-CM | POA: Diagnosis not present

## 2017-09-15 MED ORDER — ARMODAFINIL 200 MG PO TABS: ORAL_TABLET | ORAL | 5 refills | Status: DC

## 2017-09-15 NOTE — Progress Notes (Signed)
[facility]  PATIENT: Stephen Franklin DOB: December 02, 1965  REFERRING DOCTOR OR PCP:  Starlyn Skeans, PA-C SOURCE: Patient, notes from [facility], downloads from the CPAP.  _________________________________   HISTORICAL  CHIEF COMPLAINT:  Chief Complaint  Patient presents with  . Memory Loss    Sts. wife c/o he is more forgetfull.  Just got a new CPAP machine yesterday/fim    HISTORY OF PRESENT ILLNESS:  Stephen Franklin is a 52 yo man with obstructive sleep apnea and memory difficulty  Update 09/15/17: He reports his wife notes he is more forgetful.    As examples he has forgotten appointments and does not remember some things his wife has told him.   He scored 24/30 on the MoCA.   However, when I retested the short term memory, he scored 3 out of 3 at 4 minutes and did not need any problems.  He notes his focus and attention are worse than a few years ago.   He also has sleepiness.  Sleepiness is better on CPAP but never resolved completely.   He is on clonazepam only about 4/week.   He no longer takes oxycodone (was only on after surgery).     He got a new CPAP and feels he did better.   He slept 7 1/2 hours straight last night      He has joined a gym and tries to exercise several times a week   Montreal Cognitive Assessment  09/15/2017  Visuospatial/ Executive (0/5) 4  Naming (0/3) 3  Attention: Read list of digits (0/2) 2  Attention: Read list of letters (0/1) 1  Attention: Serial 7 subtraction starting at 100 (0/3) 2  Language: Repeat phrase (0/2) 2  Language : Fluency (0/1) 1  Abstraction (0/2) 2  Delayed Recall (0/5) 0  Orientation (0/6) 6  Total 23  Adjusted Score (based on education) 24       From 08/30/2017: He was diagnosed with OSA in 2012 after presenting with snoring and EDS.   His first one was done at [facility] and he recalls an AHI = 24.   He was titrated to CPAP +14 cm.   He has excellent compliance (100%) and  efficacy (30 day AHI = 2.4 today).     He feels much better the next day when he uses CPAP.   He feels the machine helped him more initially than it does now and he still has some EDS.     He does not snore through the night.  On a typical knight, he goes to bed 1030 pm and falls asleep in 10-15 minutes.    He does not wake up at night.  In the morning he feels fairly refreshed.     EPWORTH SLEEPINESS SCALE  On a scale of 0 - 3 what is the chance of dozing:  Sitting and Reading:   1 Watching TV:    1 Sitting inactive in a public place: 2 Passenger in car for one hour: 0 Lying down to rest in the afternoon: 3 Sitting and talking to someone: 0 Sitting quietly after lunch:  3 In a car, stopped in traffic:  0  Total (out of 24):    10/24  (mild excessive daytime sleepiness).     Health is fairly good.   BP is good.  He is on a beta blockers for occasional palpitations.   He has NIDDM.    He has had obesity x years.     He  has ED most of the time.    Viagra has not helped.    Surgery was discussed with him by urology.          REVIEW OF SYSTEMS: Constitutional: No fevers, chills, sweats, or change in appetite Eyes: No visual changes, double vision, eye pain Ear, nose and throat: No hearing loss, ear pain, nasal congestion, sore throat Cardiovascular: No chest pain, palpitations Respiratory: No shortness of breath at rest or with exertion.   No wheezes GastrointestinaI: No nausea, vomiting, diarrhea, abdominal pain, fecal incontinence Genitourinary: No dysuria, urinary retention or frequency.  No nocturia. Musculoskeletal: No neck pain, back pain Integumentary: No rash, pruritus, skin lesions Neurological: as above Psychiatric: No depression at this time.  No anxiety Endocrine: No palpitations, diaphoresis, change in appetite, change in weigh or increased thirst Hematologic/Lymphatic: No anemia, purpura, petechiae. Allergic/Immunologic: No itchy/runny eyes, nasal congestion, recent  allergic reactions, rashes  ALLERGIES: Allergies  Allergen Reactions  . Naproxen Sodium Hives, Shortness Of Breath and Swelling  . Bee Venom Itching    HOME MEDICATIONS:  Current Outpatient Medications:  .  Armodafinil (NUVIGIL) 200 MG TABS, One po qAM, Disp: 30 tablet, Rfl: 5 .  buPROPion (WELLBUTRIN SR) 150 MG 12 hr tablet, Take 150 mg by mouth daily., Disp: , Rfl:  .  busPIRone (BUSPAR) 30 MG tablet, Take 1 tablet (30 mg total) by mouth 2 (two) times daily., Disp: 60 tablet, Rfl: 11 .  clonazePAM (KLONOPIN) 1 MG tablet, Take 1 mg by mouth 3 (three) times daily as needed (for anxiety). , Disp: , Rfl:  .  fenofibrate 160 MG tablet, Take 160 mg by mouth daily., Disp: , Rfl: 3 .  ibuprofen (ADVIL,MOTRIN) 200 MG tablet, Take 600 mg by mouth every 8 (eight) hours as needed (for back pain & muscle pain/headaches)., Disp: , Rfl:  .  metFORMIN (GLUCOPHAGE-XR) 500 MG 24 hr tablet, Take 500 mg by mouth 2 (two) times daily., Disp: , Rfl: 3 .  metoprolol tartrate (LOPRESSOR) 25 MG tablet, Take 1 tablet (25 mg total) by mouth 2 (two) times daily. May take additional 12.5mg  daily as needed for palpitations (Patient taking differently: Take 25 mg by mouth daily. ), Disp: 135 tablet, Rfl: 1 .  Omega-3 Fatty Acids (FISH OIL) 1000 MG CAPS, Take 1,000 mg by mouth daily. , Disp: , Rfl:  .  oxyCODONE (OXY IR/ROXICODONE) 5 MG immediate release tablet, Take 1 tablet (5 mg total) by mouth every 4 (four) hours as needed for moderate pain., Disp: 30 tablet, Rfl: 0 .  pravastatin (PRAVACHOL) 40 MG tablet, Take 40 mg by mouth daily., Disp: , Rfl: 3 .  sertraline (ZOLOFT) 100 MG tablet, Take 200 mg by mouth daily. , Disp: , Rfl:  .  Testosterone 20.25 MG/ACT (1.62%) GEL, Apply 2 Pump topically daily. 1 pump applied to each shoulder every morning., Disp: , Rfl: 5 .  traZODone (DESYREL) 100 MG tablet, Take 100 mg by mouth at bedtime. , Disp: , Rfl:  .  TURMERIC PO, Take 1,000 mg by mouth daily., Disp: , Rfl:   PAST  MEDICAL HISTORY: Past Medical History:  Diagnosis Date  . Anxiety   . Diabetes mellitus without complication (HCC)    Type 2  . Dysrhythmia    palpitations  . GERD (gastroesophageal reflux disease)   . High cholesterol   . History of kidney stones   . Hypothyroidism    hx of correcte no meds now  . Sleep apnea    cpap  PAST SURGICAL HISTORY: Past Surgical History:  Procedure Laterality Date  . COLONOSCOPY    . HERNIA REPAIR     Left inguinal, umbilical with mesh 0-98-11 Dr. Redmond Pulling  . UMBILICAL HERNIA REPAIR N/A 06/23/2017   Procedure: LAPAROSCOPIC ASSISTED UMBILICAL HERNIA REPAIR WITH MESH;  Surgeon: Greer Pickerel, MD;  Location: [facility];  Service: General;  Laterality: N/A;    FAMILY HISTORY: Family History  Adopted: Yes  Problem Relation Age of Onset  . Heart failure Mother   . Breast cancer Mother   . Diabetes Mother   . Lung cancer Father     SOCIAL HISTORY:  Social History   Socioeconomic History  . Marital status: Married    Spouse name: Not on file  . Number of children: Not on file  . Years of education: Not on file  . Highest education level: Not on file  Occupational History  . Not on file  Social Needs  . Financial resource strain: Not on file  . Food insecurity:    Worry: Not on file    Inability: Not on file  . Transportation needs:    Medical: Not on file    Non-medical: Not on file  Tobacco Use  . Smoking status: Former Smoker    Last attempt to quit: 06/21/2010    Years since quitting: 7.2  . Smokeless tobacco: Former Systems developer    Quit date: 06/13/2010  Substance and Sexual Activity  . Alcohol use: No    Comment: hx of none in 7-8 years  . Drug use: No  . Sexual activity: Never  Lifestyle  . Physical activity:    Days per week: Not on file    Minutes per session: Not on file  . Stress: Not on file  Relationships  . Social connections:    Talks on phone: Not on file    Gets together: Not on file    Attends religious service: Not on  file    Active member of club or organization: Not on file    Attends meetings of clubs or organizations: Not on file    Relationship status: Not on file  . Intimate partner violence:    Fear of current or ex partner: Not on file    Emotionally abused: Not on file    Physically abused: Not on file    Forced sexual activity: Not on file  Other Topics Concern  . Not on file  Social History Narrative  . Not on file     PHYSICAL EXAM  There were no vitals filed for this visit.  There is no height or weight on file to calculate BMI.   General: The patient is an obese man in no acute distress   Neurologic Exam  Mental status: The patient is alert and oriented x 3 at the time of the examination.  He scored 24/30 on the MoCA.   He did better on recall (class III) when I examined him later.  Speech is normal.  Cranial nerves: Extraocular movements are full.  Facial strength and sensation is normal.  Trapezius strength is strong.  The tongue is midline, and the patient has symmetric elevation of the soft palate. No obvious hearing deficits are noted.  Motor:  Muscle bulk is normal.   Tone is normal. Strength is  5 / 5 in all 4 extremities.    Coordination: Cerebellar testing reveals good finger-nose-finger  Gait and station: Station is normal.   The gait and tandem gait are normal.  Reflexes: Deep tendon reflexes are symmetric and normal bilaterally.       DIAGNOSTIC DATA (LABS, IMAGING, TESTING) - I reviewed patient records, labs, notes, testing and imaging myself where available.  Lab Results  Component Value Date   WBC 5.5 06/21/2017   HGB 14.5 06/21/2017   HCT 43.5 06/21/2017   MCV 95.6 06/21/2017   PLT 192 06/21/2017      Component Value Date/Time   NA 137 06/21/2017 1120   K 4.2 06/21/2017 1120   CL 107 06/21/2017 1120   CO2 24 06/21/2017 1120   GLUCOSE 113 (H) 06/21/2017 1120   BUN 15 06/21/2017 1120   CREATININE 1.21 06/21/2017 1120   CALCIUM 8.7 (L)  06/21/2017 1120   PROT 7.3 08/22/2012 2023   ALBUMIN 3.9 08/22/2012 2023   AST 23 08/22/2012 2023   ALT 42 08/22/2012 2023   ALKPHOS 68 08/22/2012 2023   BILITOT 0.3 08/22/2012 2023   GFRNONAA >60 06/21/2017 1120   GFRAA >60 06/21/2017 1120   Lab Results  Component Value Date   CHOL 174 01/02/2010   HDL 30.80 (L) 01/02/2010   LDLDIRECT 95.1 01/02/2010   TRIG 298.0 (H) 01/02/2010   CHOLHDL 6 01/02/2010   Lab Results  Component Value Date   HGBA1C 6.0 (H) 06/21/2017   No results found for: VITAMINB12 Lab Results  Component Value Date   TSH 0.95 01/02/2010       ASSESSMENT AND PLAN  Memory loss - Plan: TSH, Vitamin B12, T3 Uptake, T4  Obstructive sleep apnea  Excessive daytime sleepiness  Depression with anxiety  Class 3 severe obesity due to excess calories with serious comorbidity and body mass index (BMI) of 40.0 to 44.9 in adult ([place])    1.   I think that his memory difficulties are more likely due to reduced focus and attention rather than dementia.  Sleep apnea and depression or his biggest risk.  He does take lorazepam but limits it to just a few pills a weeks so this is unlikely to be playing a role.   2.   Nuvigil 200 mg every morning.  Hopefully this will help with his excessive daytime sleepiness that has persisted despite CPAP therapy and may also help some of his focus and attention.  If not better, consider a stimulant. 3.   Return to see me in 6 months for regular visit but call sooner if any new or worsening neurologic symptoms.   Louanna Vanliew A. Felecia Shelling, MD, [facility] 06/18/4006, 6:76 PM Certified in Neurology, Clinical Neurophysiology, Sleep Medicine, Pain Medicine and Neuroimaging  [facility] [address] [place],  [address] [phone]

## 2017-09-16 LAB — T3 UPTAKE
Free Thyroxine Index: 1.4 (ref 1.2–4.9)
T3 UPTAKE RATIO: 24 % (ref 24–39)

## 2017-09-16 LAB — VITAMIN B12: VITAMIN B 12: 473 pg/mL (ref 232–1245)

## 2017-09-16 LAB — TSH: TSH: 3.66 u[IU]/mL (ref 0.450–4.500)

## 2017-09-16 LAB — T4: T4 TOTAL: 5.7 ug/dL (ref 4.5–12.0)

## 2017-09-19 ENCOUNTER — Telehealth: Payer: Self-pay | Admitting: *Deleted

## 2017-09-19 NOTE — Telephone Encounter (Signed)
Spoke with Stephen Franklin and reviewed below lab results.  He verbalized understanding of same/fim

## 2017-09-19 NOTE — Telephone Encounter (Signed)
-----   Message from Britt Bottom, MD sent at 09/16/2017  4:59 PM EDT ----- Please let the patient know that the lab work is fine.

## 2017-09-20 DIAGNOSIS — R55 Syncope and collapse: Secondary | ICD-10-CM | POA: Diagnosis not present

## 2017-09-22 DIAGNOSIS — E119 Type 2 diabetes mellitus without complications: Secondary | ICD-10-CM | POA: Diagnosis not present

## 2017-09-22 DIAGNOSIS — Z7984 Long term (current) use of oral hypoglycemic drugs: Secondary | ICD-10-CM | POA: Diagnosis not present

## 2017-09-27 ENCOUNTER — Telehealth: Payer: Self-pay | Admitting: *Deleted

## 2017-09-27 NOTE — Telephone Encounter (Signed)
PA for Armodafinil 200mg , one tablet daily completed by phone with [employer], phone# [phone].  Dx: OSA (G47.33), Excessive daytime sleepiness (G47.19).  Approved for dates 06/12/17 thru 06/13/18.  Ref# [id number]

## 2017-10-03 DIAGNOSIS — R69 Illness, unspecified: Secondary | ICD-10-CM | POA: Diagnosis not present

## 2017-10-05 DIAGNOSIS — L570 Actinic keratosis: Secondary | ICD-10-CM | POA: Diagnosis not present

## 2017-10-05 DIAGNOSIS — D225 Melanocytic nevi of trunk: Secondary | ICD-10-CM | POA: Diagnosis not present

## 2017-10-05 DIAGNOSIS — L918 Other hypertrophic disorders of the skin: Secondary | ICD-10-CM | POA: Diagnosis not present

## 2017-10-05 DIAGNOSIS — L821 Other seborrheic keratosis: Secondary | ICD-10-CM | POA: Diagnosis not present

## 2017-10-14 DIAGNOSIS — E119 Type 2 diabetes mellitus without complications: Secondary | ICD-10-CM | POA: Diagnosis not present

## 2017-10-14 DIAGNOSIS — Z7984 Long term (current) use of oral hypoglycemic drugs: Secondary | ICD-10-CM | POA: Diagnosis not present

## 2017-10-14 DIAGNOSIS — Z6841 Body Mass Index (BMI) 40.0 and over, adult: Secondary | ICD-10-CM | POA: Diagnosis not present

## 2017-10-14 DIAGNOSIS — E785 Hyperlipidemia, unspecified: Secondary | ICD-10-CM | POA: Diagnosis not present

## 2017-10-14 DIAGNOSIS — G4733 Obstructive sleep apnea (adult) (pediatric): Secondary | ICD-10-CM | POA: Diagnosis not present

## 2017-10-17 DIAGNOSIS — Z125 Encounter for screening for malignant neoplasm of prostate: Secondary | ICD-10-CM | POA: Diagnosis not present

## 2017-10-17 DIAGNOSIS — E291 Testicular hypofunction: Secondary | ICD-10-CM | POA: Diagnosis not present

## 2017-10-19 DIAGNOSIS — G4733 Obstructive sleep apnea (adult) (pediatric): Secondary | ICD-10-CM | POA: Diagnosis not present

## 2017-10-20 DIAGNOSIS — E291 Testicular hypofunction: Secondary | ICD-10-CM | POA: Diagnosis not present

## 2017-10-20 DIAGNOSIS — R7309 Other abnormal glucose: Secondary | ICD-10-CM | POA: Diagnosis not present

## 2017-11-01 DIAGNOSIS — R69 Illness, unspecified: Secondary | ICD-10-CM | POA: Diagnosis not present

## 2017-11-09 DIAGNOSIS — R69 Illness, unspecified: Secondary | ICD-10-CM | POA: Diagnosis not present

## 2017-11-14 DIAGNOSIS — G4733 Obstructive sleep apnea (adult) (pediatric): Secondary | ICD-10-CM | POA: Diagnosis not present

## 2017-11-15 ENCOUNTER — Encounter: Payer: Self-pay | Admitting: Neurology

## 2017-11-15 ENCOUNTER — Ambulatory Visit: Payer: Medicare HMO | Admitting: Neurology

## 2017-11-15 VITALS — BP 117/75 | HR 78 | Ht 71.0 in | Wt 308.0 lb

## 2017-11-15 DIAGNOSIS — R413 Other amnesia: Secondary | ICD-10-CM | POA: Diagnosis not present

## 2017-11-15 DIAGNOSIS — G4733 Obstructive sleep apnea (adult) (pediatric): Secondary | ICD-10-CM

## 2017-11-15 DIAGNOSIS — Z6841 Body Mass Index (BMI) 40.0 and over, adult: Secondary | ICD-10-CM

## 2017-11-15 DIAGNOSIS — G4719 Other hypersomnia: Secondary | ICD-10-CM | POA: Diagnosis not present

## 2017-11-15 NOTE — Progress Notes (Signed)
[facility]  PATIENT: Stephen Franklin DOB: 23-May-1966  REFERRING DOCTOR OR PCP:  Starlyn Skeans, PA-C SOURCE: Patient, notes from [facility], downloads from the CPAP.  _________________________________   HISTORICAL  CHIEF COMPLAINT:  Chief Complaint  Patient presents with  . Follow-up    Patient here for follow up on his CPAP.    HISTORY OF PRESENT ILLNESS:  Stephen Franklin is a 52 yo man with obstructive sleep apnea and memory difficulty  Update 11/15/2017: He has his new CPAP machine.  He likes it much better than the one in the past and is able to use it on a nightly basis.  He feels much better the next day when he uses CPAP for the entire night.  The download shows excellent compliance (93% > 4 hrs and 100% night).  AHI = 3.4  He feels his memory is better, back to baseline.   He denies any excessive daytime sleepiness at this time.  EPWORTH SLEEPINESS SCALE  On a scale of 0 - 3 what is the chance of dozing:  Sitting and Reading:   0 Watching TV:    0 Sitting inactive in a public place: 0 Passenger in car for one hour: 0 Lying down to rest in the afternoon: 0 Sitting and talking to someone: 0 Sitting quietly after lunch:  0 In a car, stopped in traffic:  0  Total (out of 24):     0/24    Was 10/24 before CPAP    Update 09/15/17: He reports his wife notes he is more forgetful.    As examples he has forgotten appointments and does not remember some things his wife has told him.   He scored 24/30 on the MoCA.   However, when I retested the short term memory, he scored 3 out of 3 at 4 minutes and did not need any problems.  He notes his focus and attention are worse than a few years ago.   He also has sleepiness.  Sleepiness is better on CPAP but never resolved completely.   He is on clonazepam only about 4/week.   He no longer takes oxycodone (was only on after surgery).     He got a new CPAP and feels he did better.   He slept 7 1/2  hours straight last night      He has joined a gym and tries to exercise several times a week   Montreal Cognitive Assessment  09/15/2017  Visuospatial/ Executive (0/5) 4  Naming (0/3) 3  Attention: Read list of digits (0/2) 2  Attention: Read list of letters (0/1) 1  Attention: Serial 7 subtraction starting at 100 (0/3) 2  Language: Repeat phrase (0/2) 2  Language : Fluency (0/1) 1  Abstraction (0/2) 2  Delayed Recall (0/5) 0  Orientation (0/6) 6  Total 23  Adjusted Score (based on education) 24       From 08/30/2017: He was diagnosed with OSA in 2012 after presenting with snoring and EDS.   His first one was done at [facility] and he recalls an AHI = 24.   He was titrated to CPAP +14 cm.   He has excellent compliance (100%) and efficacy (30 day AHI = 2.4 today).     He feels much better the next day when he uses CPAP.   He feels the machine helped him more initially than it does now and he still has some EDS.     He does not snore through  the night.  On a typical knight, he goes to bed 1030 pm and falls asleep in 10-15 minutes.    He does not wake up at night.  In the morning he feels fairly refreshed.     EPWORTH SLEEPINESS SCALE  On a scale of 0 - 3 what is the chance of dozing:  Sitting and Reading:   1 Watching TV:    1 Sitting inactive in a public place: 2 Passenger in car for one hour: 0 Lying down to rest in the afternoon: 3 Sitting and talking to someone: 0 Sitting quietly after lunch:  3 In a car, stopped in traffic:  0  Total (out of 24):    10/24  (mild excessive daytime sleepiness).     Health is fairly good.   BP is good.  He is on a beta blockers for occasional palpitations.   He has NIDDM.    He has had obesity x years.     He has ED most of the time.    Viagra has not helped.    Surgery was discussed with him by urology.          REVIEW OF SYSTEMS: Constitutional: No fevers, chills, sweats, or change in appetite.   Mild insomnia/RLS better on  CPAP Eyes: No visual changes, double vision, eye pain Ear, nose and throat: No hearing loss, ear pain, nasal congestion, sore throat Cardiovascular: No chest pain, palpitations Respiratory: No shortness of breath at rest or with exertion.   OSA and occ coughing GastrointestinaI: No nausea, vomiting, diarrhea, abdominal pain, fecal incontinence Genitourinary: No dysuria, urinary retention or frequency.  No nocturia. Musculoskeletal: has some LBP and myalgias Integumentary: No rash, pruritus, skin lesions Neurological: as above Psychiatric: No depression at this time.  No anxiety Endocrine: No palpitations, diaphoresis, change in appetite, change in weigh or increased thirst Hematologic/Lymphatic: No anemia, purpura, petechiae. Allergic/Immunologic: No itchy/runny eyes, nasal congestion, recent allergic reactions, rashes  ALLERGIES: Allergies  Allergen Reactions  . Naproxen Sodium Hives, Shortness Of Breath and Swelling  . Bee Venom Itching    HOME MEDICATIONS:  Current Outpatient Medications:  .  Armodafinil (NUVIGIL) 200 MG TABS, One po qAM, Disp: 30 tablet, Rfl: 5 .  buPROPion (WELLBUTRIN SR) 150 MG 12 hr tablet, Take 150 mg by mouth daily., Disp: , Rfl:  .  busPIRone (BUSPAR) 30 MG tablet, Take 1 tablet (30 mg total) by mouth 2 (two) times daily., Disp: 60 tablet, Rfl: 11 .  clonazePAM (KLONOPIN) 1 MG tablet, Take 1 mg by mouth 3 (three) times daily as needed (for anxiety). , Disp: , Rfl:  .  fenofibrate 160 MG tablet, Take 160 mg by mouth daily., Disp: , Rfl: 3 .  ibuprofen (ADVIL,MOTRIN) 200 MG tablet, Take 600 mg by mouth every 8 (eight) hours as needed (for back pain & muscle pain/headaches)., Disp: , Rfl:  .  metFORMIN (GLUCOPHAGE-XR) 500 MG 24 hr tablet, Take 500 mg by mouth 2 (two) times daily., Disp: , Rfl: 3 .  metoprolol tartrate (LOPRESSOR) 25 MG tablet, Take 1 tablet (25 mg total) by mouth 2 (two) times daily. May take additional 12.5mg  daily as needed for  palpitations (Patient taking differently: Take 25 mg by mouth daily. ), Disp: 135 tablet, Rfl: 1 .  Omega-3 Fatty Acids (FISH OIL) 1000 MG CAPS, Take 1,000 mg by mouth daily. , Disp: , Rfl:  .  pravastatin (PRAVACHOL) 40 MG tablet, Take 40 mg by mouth daily., Disp: , Rfl: 3 .  sertraline (ZOLOFT) 100 MG tablet, Take 200 mg by mouth daily. , Disp: , Rfl:  .  Testosterone 20.25 MG/ACT (1.62%) GEL, Apply 2 Pump topically daily. 1 pump applied to each shoulder every morning., Disp: , Rfl: 5 .  traZODone (DESYREL) 100 MG tablet, Take 100 mg by mouth at bedtime. , Disp: , Rfl:  .  TURMERIC PO, Take 1,000 mg by mouth daily., Disp: , Rfl:   PAST MEDICAL HISTORY: Past Medical History:  Diagnosis Date  . Anxiety   . Diabetes mellitus without complication (HCC)    Type 2  . Dysrhythmia    palpitations  . GERD (gastroesophageal reflux disease)   . High cholesterol   . History of kidney stones   . Hypothyroidism    hx of correcte no meds now  . Sleep apnea    cpap    PAST SURGICAL HISTORY: Past Surgical History:  Procedure Laterality Date  . COLONOSCOPY    . HERNIA REPAIR     Left inguinal, umbilical with mesh [id number] Dr. Redmond Pulling  . UMBILICAL HERNIA REPAIR N/A 06/23/2017   Procedure: LAPAROSCOPIC ASSISTED UMBILICAL HERNIA REPAIR WITH MESH;  Surgeon: Greer Pickerel, MD;  Location: [facility];  Service: General;  Laterality: N/A;    FAMILY HISTORY: Family History  Adopted: Yes  Problem Relation Age of Onset  . Heart failure Mother   . Breast cancer Mother   . Diabetes Mother   . Lung cancer Father     SOCIAL HISTORY:  Social History   Socioeconomic History  . Marital status: Married    Spouse name: Not on file  . Number of children: Not on file  . Years of education: Not on file  . Highest education level: Not on file  Occupational History  . Not on file  Social Needs  . Financial resource strain: Not on file  . Food insecurity:    Worry: Not on file    Inability: Not on file    . Transportation needs:    Medical: Not on file    Non-medical: Not on file  Tobacco Use  . Smoking status: Former Smoker    Last attempt to quit: 06/21/2010    Years since quitting: 7.4  . Smokeless tobacco: Former Systems developer    Quit date: 06/13/2010  Substance and Sexual Activity  . Alcohol use: No    Comment: hx of none in 7-8 years  . Drug use: No  . Sexual activity: Never  Lifestyle  . Physical activity:    Days per week: Not on file    Minutes per session: Not on file  . Stress: Not on file  Relationships  . Social connections:    Talks on phone: Not on file    Gets together: Not on file    Attends religious service: Not on file    Active member of club or organization: Not on file    Attends meetings of clubs or organizations: Not on file    Relationship status: Not on file  . Intimate partner violence:    Fear of current or ex partner: Not on file    Emotionally abused: Not on file    Physically abused: Not on file    Forced sexual activity: Not on file  Other Topics Concern  . Not on file  Social History Narrative  . Not on file     PHYSICAL EXAM  Vitals:   11/15/17 1059  BP: 117/75  Pulse: 78  Weight: (!) 308 lb (139.7  kg)  Height: 5\' 11"  (1.803 m)    Body mass index is 42.96 kg/m.   General: The patient is an obese man in no acute distress   Neurologic Exam  Mental status: The patient is alert and oriented x 3 at the time of the examination.  Speech is normal.  He appears to have normal short-term memory and focus and attention.  Cranial nerves: Extraocular movements are full.  Facial strength and sensation was normal.  The trapezius strength is normal.  The tongue is midline, and the patient has symmetric elevation of the soft palate. No obvious hearing deficits are noted.  Motor:  Muscle bulk is normal.   Tone is normal. Strength is  5 / 5 in all 4 extremities.    Coordination: Cerebellar testing reveals good finger-nose-finger  Gait and  station: Station is normal.  Gait and tandem walk are normal.  Reflexes: Deep tendon reflexes are symmetric and normal bilaterally.       DIAGNOSTIC DATA (LABS, IMAGING, TESTING) - I reviewed patient records, labs, notes, testing and imaging myself where available.  Lab Results  Component Value Date   WBC 5.5 06/21/2017   HGB 14.5 06/21/2017   HCT 43.5 06/21/2017   MCV 95.6 06/21/2017   PLT 192 06/21/2017      Component Value Date/Time   NA 137 06/21/2017 1120   K 4.2 06/21/2017 1120   CL 107 06/21/2017 1120   CO2 24 06/21/2017 1120   GLUCOSE 113 (H) 06/21/2017 1120   BUN 15 06/21/2017 1120   CREATININE 1.21 06/21/2017 1120   CALCIUM 8.7 (L) 06/21/2017 1120   PROT 7.3 08/22/2012 2023   ALBUMIN 3.9 08/22/2012 2023   AST 23 08/22/2012 2023   ALT 42 08/22/2012 2023   ALKPHOS 68 08/22/2012 2023   BILITOT 0.3 08/22/2012 2023   GFRNONAA >60 06/21/2017 1120   GFRAA >60 06/21/2017 1120   Lab Results  Component Value Date   CHOL 174 01/02/2010   HDL 30.80 (L) 01/02/2010   LDLDIRECT 95.1 01/02/2010   TRIG 298.0 (H) 01/02/2010   CHOLHDL 6 01/02/2010   Lab Results  Component Value Date   HGBA1C 6.0 (H) 06/21/2017   Lab Results  Component Value Date   VITAMINB12 473 09/15/2017   Lab Results  Component Value Date   TSH 3.660 09/15/2017       ASSESSMENT AND PLAN  Obstructive sleep apnea  Class 3 severe obesity due to excess calories with serious comorbidity and body mass index (BMI) of 40.0 to 44.9 in adult (HCC)  Excessive daytime sleepiness  Memory loss    1.   Continue CPAP.  I increased his pressure slightly from +14 to +15 cm H2O. 2.   If sleepiness worsens he can take Nuvigil again. 3.   Return to see me in 12 months for regular visit but call sooner if any new or worsening neurologic symptoms.   Richard A. Felecia Shelling, MD, [facility] 5/0/5397, 6:73 PM Certified in Neurology, Clinical Neurophysiology, Sleep Medicine, Pain Medicine and  Neuroimaging  [facility] [address] [place], [place] [address] [phone]

## 2017-11-18 DIAGNOSIS — R69 Illness, unspecified: Secondary | ICD-10-CM | POA: Diagnosis not present

## 2017-11-18 DIAGNOSIS — M2550 Pain in unspecified joint: Secondary | ICD-10-CM | POA: Diagnosis not present

## 2017-11-18 DIAGNOSIS — Z125 Encounter for screening for malignant neoplasm of prostate: Secondary | ICD-10-CM | POA: Diagnosis not present

## 2017-11-18 DIAGNOSIS — E291 Testicular hypofunction: Secondary | ICD-10-CM | POA: Diagnosis not present

## 2017-11-18 DIAGNOSIS — R1033 Periumbilical pain: Secondary | ICD-10-CM | POA: Diagnosis not present

## 2017-11-18 DIAGNOSIS — R5383 Other fatigue: Secondary | ICD-10-CM | POA: Diagnosis not present

## 2017-11-25 DIAGNOSIS — E291 Testicular hypofunction: Secondary | ICD-10-CM | POA: Diagnosis not present

## 2017-11-30 DIAGNOSIS — R0602 Shortness of breath: Secondary | ICD-10-CM | POA: Diagnosis not present

## 2017-11-30 DIAGNOSIS — R1011 Right upper quadrant pain: Secondary | ICD-10-CM | POA: Diagnosis not present

## 2017-12-07 DIAGNOSIS — R1011 Right upper quadrant pain: Secondary | ICD-10-CM | POA: Diagnosis not present

## 2017-12-07 DIAGNOSIS — K760 Fatty (change of) liver, not elsewhere classified: Secondary | ICD-10-CM | POA: Diagnosis not present

## 2017-12-13 DIAGNOSIS — R1031 Right lower quadrant pain: Secondary | ICD-10-CM | POA: Diagnosis not present

## 2017-12-14 DIAGNOSIS — G4733 Obstructive sleep apnea (adult) (pediatric): Secondary | ICD-10-CM | POA: Diagnosis not present

## 2017-12-14 DIAGNOSIS — R69 Illness, unspecified: Secondary | ICD-10-CM | POA: Diagnosis not present

## 2017-12-14 DIAGNOSIS — F410 Panic disorder [episodic paroxysmal anxiety] without agoraphobia: Secondary | ICD-10-CM | POA: Diagnosis not present

## 2017-12-19 DIAGNOSIS — Z87442 Personal history of urinary calculi: Secondary | ICD-10-CM | POA: Diagnosis not present

## 2017-12-19 DIAGNOSIS — E291 Testicular hypofunction: Secondary | ICD-10-CM | POA: Diagnosis not present

## 2017-12-19 DIAGNOSIS — R1031 Right lower quadrant pain: Secondary | ICD-10-CM | POA: Diagnosis not present

## 2017-12-19 DIAGNOSIS — N281 Cyst of kidney, acquired: Secondary | ICD-10-CM | POA: Diagnosis not present

## 2017-12-20 ENCOUNTER — Telehealth: Payer: Self-pay | Admitting: Neurology

## 2017-12-20 DIAGNOSIS — G4733 Obstructive sleep apnea (adult) (pediatric): Secondary | ICD-10-CM | POA: Diagnosis not present

## 2017-12-20 NOTE — Telephone Encounter (Signed)
LMOM (identified vm). I have not received any orders from [facility] recently. I tried to contact [employer], left message on their ans. machine that I have not received orders, please fax again/fim

## 2017-12-20 NOTE — Telephone Encounter (Signed)
Pt has called stating that [facility] has faxed a form over for Dr Felecia Shelling to complete for insurance purposes re: his CPAP supplies . The fax was sent to [place] @ [phone].  Pt would like a call back as soon as possible re: when he can come and get the completed form since he needs his supplies

## 2017-12-20 NOTE — Telephone Encounter (Signed)
Fax received from [facility], signed and faxed back with fax confirmation received. LMOM (identified vm) letting pt. know this has been done/fim

## 2017-12-20 NOTE — Telephone Encounter (Signed)
Pt requesting a call once order forms have been filled out. Stating he will really need this done by the end of the day of at all possible

## 2017-12-21 DIAGNOSIS — M4184 Other forms of scoliosis, thoracic region: Secondary | ICD-10-CM | POA: Diagnosis not present

## 2017-12-21 DIAGNOSIS — M48061 Spinal stenosis, lumbar region without neurogenic claudication: Secondary | ICD-10-CM | POA: Diagnosis not present

## 2017-12-21 DIAGNOSIS — R109 Unspecified abdominal pain: Secondary | ICD-10-CM | POA: Diagnosis not present

## 2017-12-21 DIAGNOSIS — M545 Low back pain: Secondary | ICD-10-CM | POA: Diagnosis not present

## 2017-12-21 DIAGNOSIS — M47816 Spondylosis without myelopathy or radiculopathy, lumbar region: Secondary | ICD-10-CM | POA: Diagnosis not present

## 2017-12-21 DIAGNOSIS — M47814 Spondylosis without myelopathy or radiculopathy, thoracic region: Secondary | ICD-10-CM | POA: Diagnosis not present

## 2017-12-26 NOTE — Telephone Encounter (Signed)
error 

## 2018-01-09 DIAGNOSIS — E291 Testicular hypofunction: Secondary | ICD-10-CM | POA: Diagnosis not present

## 2018-01-11 DIAGNOSIS — J029 Acute pharyngitis, unspecified: Secondary | ICD-10-CM | POA: Diagnosis not present

## 2018-01-11 DIAGNOSIS — G4733 Obstructive sleep apnea (adult) (pediatric): Secondary | ICD-10-CM | POA: Diagnosis not present

## 2018-01-11 DIAGNOSIS — E785 Hyperlipidemia, unspecified: Secondary | ICD-10-CM | POA: Diagnosis not present

## 2018-01-11 DIAGNOSIS — I10 Essential (primary) hypertension: Secondary | ICD-10-CM | POA: Diagnosis not present

## 2018-01-11 DIAGNOSIS — Z7984 Long term (current) use of oral hypoglycemic drugs: Secondary | ICD-10-CM | POA: Diagnosis not present

## 2018-01-11 DIAGNOSIS — G5601 Carpal tunnel syndrome, right upper limb: Secondary | ICD-10-CM | POA: Diagnosis not present

## 2018-01-11 DIAGNOSIS — M5134 Other intervertebral disc degeneration, thoracic region: Secondary | ICD-10-CM | POA: Diagnosis not present

## 2018-01-11 DIAGNOSIS — Z6841 Body Mass Index (BMI) 40.0 and over, adult: Secondary | ICD-10-CM | POA: Diagnosis not present

## 2018-01-11 DIAGNOSIS — E119 Type 2 diabetes mellitus without complications: Secondary | ICD-10-CM | POA: Diagnosis not present

## 2018-01-14 DIAGNOSIS — G4733 Obstructive sleep apnea (adult) (pediatric): Secondary | ICD-10-CM | POA: Diagnosis not present

## 2018-01-18 DIAGNOSIS — E119 Type 2 diabetes mellitus without complications: Secondary | ICD-10-CM | POA: Diagnosis not present

## 2018-01-18 DIAGNOSIS — I10 Essential (primary) hypertension: Secondary | ICD-10-CM | POA: Diagnosis not present

## 2018-01-18 DIAGNOSIS — E785 Hyperlipidemia, unspecified: Secondary | ICD-10-CM | POA: Diagnosis not present

## 2018-01-31 ENCOUNTER — Encounter: Payer: Self-pay | Admitting: Neurology

## 2018-02-01 DIAGNOSIS — L570 Actinic keratosis: Secondary | ICD-10-CM | POA: Diagnosis not present

## 2018-02-14 DIAGNOSIS — G4733 Obstructive sleep apnea (adult) (pediatric): Secondary | ICD-10-CM | POA: Diagnosis not present

## 2018-02-15 DIAGNOSIS — E291 Testicular hypofunction: Secondary | ICD-10-CM | POA: Diagnosis not present

## 2018-02-24 DIAGNOSIS — E785 Hyperlipidemia, unspecified: Secondary | ICD-10-CM | POA: Diagnosis not present

## 2018-02-24 DIAGNOSIS — Z6841 Body Mass Index (BMI) 40.0 and over, adult: Secondary | ICD-10-CM | POA: Diagnosis not present

## 2018-02-24 DIAGNOSIS — G4730 Sleep apnea, unspecified: Secondary | ICD-10-CM | POA: Diagnosis not present

## 2018-02-24 DIAGNOSIS — G2581 Restless legs syndrome: Secondary | ICD-10-CM | POA: Diagnosis not present

## 2018-02-24 DIAGNOSIS — G8929 Other chronic pain: Secondary | ICD-10-CM | POA: Diagnosis not present

## 2018-02-24 DIAGNOSIS — R69 Illness, unspecified: Secondary | ICD-10-CM | POA: Diagnosis not present

## 2018-02-24 DIAGNOSIS — F419 Anxiety disorder, unspecified: Secondary | ICD-10-CM | POA: Diagnosis not present

## 2018-02-24 DIAGNOSIS — E291 Testicular hypofunction: Secondary | ICD-10-CM | POA: Diagnosis not present

## 2018-02-24 DIAGNOSIS — E1136 Type 2 diabetes mellitus with diabetic cataract: Secondary | ICD-10-CM | POA: Diagnosis not present

## 2018-03-08 DIAGNOSIS — R69 Illness, unspecified: Secondary | ICD-10-CM | POA: Diagnosis not present

## 2018-03-09 DIAGNOSIS — E291 Testicular hypofunction: Secondary | ICD-10-CM | POA: Diagnosis not present

## 2018-03-14 DIAGNOSIS — H5213 Myopia, bilateral: Secondary | ICD-10-CM | POA: Diagnosis not present

## 2018-03-14 DIAGNOSIS — H52223 Regular astigmatism, bilateral: Secondary | ICD-10-CM | POA: Diagnosis not present

## 2018-03-14 DIAGNOSIS — E119 Type 2 diabetes mellitus without complications: Secondary | ICD-10-CM | POA: Diagnosis not present

## 2018-03-14 DIAGNOSIS — R69 Illness, unspecified: Secondary | ICD-10-CM | POA: Diagnosis not present

## 2018-03-14 DIAGNOSIS — H524 Presbyopia: Secondary | ICD-10-CM | POA: Diagnosis not present

## 2018-03-16 DIAGNOSIS — G4733 Obstructive sleep apnea (adult) (pediatric): Secondary | ICD-10-CM | POA: Diagnosis not present

## 2018-03-20 ENCOUNTER — Ambulatory Visit: Payer: Medicare HMO | Admitting: Neurology

## 2018-03-22 DIAGNOSIS — M79641 Pain in right hand: Secondary | ICD-10-CM | POA: Diagnosis not present

## 2018-03-22 DIAGNOSIS — G5601 Carpal tunnel syndrome, right upper limb: Secondary | ICD-10-CM | POA: Diagnosis not present

## 2018-03-24 DIAGNOSIS — G4733 Obstructive sleep apnea (adult) (pediatric): Secondary | ICD-10-CM | POA: Diagnosis not present

## 2018-04-04 DIAGNOSIS — E291 Testicular hypofunction: Secondary | ICD-10-CM | POA: Diagnosis not present

## 2018-04-16 DIAGNOSIS — G4733 Obstructive sleep apnea (adult) (pediatric): Secondary | ICD-10-CM | POA: Diagnosis not present

## 2018-04-21 DIAGNOSIS — E119 Type 2 diabetes mellitus without complications: Secondary | ICD-10-CM | POA: Diagnosis not present

## 2018-04-21 DIAGNOSIS — L821 Other seborrheic keratosis: Secondary | ICD-10-CM | POA: Diagnosis not present

## 2018-04-21 DIAGNOSIS — E785 Hyperlipidemia, unspecified: Secondary | ICD-10-CM | POA: Diagnosis not present

## 2018-04-21 DIAGNOSIS — R69 Illness, unspecified: Secondary | ICD-10-CM | POA: Diagnosis not present

## 2018-04-21 DIAGNOSIS — G4733 Obstructive sleep apnea (adult) (pediatric): Secondary | ICD-10-CM | POA: Diagnosis not present

## 2018-04-21 DIAGNOSIS — G5601 Carpal tunnel syndrome, right upper limb: Secondary | ICD-10-CM | POA: Diagnosis not present

## 2018-04-25 DIAGNOSIS — E119 Type 2 diabetes mellitus without complications: Secondary | ICD-10-CM | POA: Diagnosis not present

## 2018-04-25 DIAGNOSIS — G5601 Carpal tunnel syndrome, right upper limb: Secondary | ICD-10-CM | POA: Diagnosis not present

## 2018-04-25 DIAGNOSIS — M5134 Other intervertebral disc degeneration, thoracic region: Secondary | ICD-10-CM | POA: Diagnosis not present

## 2018-04-25 DIAGNOSIS — E291 Testicular hypofunction: Secondary | ICD-10-CM | POA: Diagnosis not present

## 2018-04-25 DIAGNOSIS — I10 Essential (primary) hypertension: Secondary | ICD-10-CM | POA: Diagnosis not present

## 2018-04-25 DIAGNOSIS — K219 Gastro-esophageal reflux disease without esophagitis: Secondary | ICD-10-CM | POA: Diagnosis not present

## 2018-04-25 DIAGNOSIS — G4733 Obstructive sleep apnea (adult) (pediatric): Secondary | ICD-10-CM | POA: Diagnosis not present

## 2018-04-25 DIAGNOSIS — R69 Illness, unspecified: Secondary | ICD-10-CM | POA: Diagnosis not present

## 2018-04-25 DIAGNOSIS — R42 Dizziness and giddiness: Secondary | ICD-10-CM | POA: Diagnosis not present

## 2018-04-28 DIAGNOSIS — L570 Actinic keratosis: Secondary | ICD-10-CM | POA: Diagnosis not present

## 2018-04-28 DIAGNOSIS — L821 Other seborrheic keratosis: Secondary | ICD-10-CM | POA: Diagnosis not present

## 2018-05-02 DIAGNOSIS — G629 Polyneuropathy, unspecified: Secondary | ICD-10-CM | POA: Diagnosis not present

## 2018-05-02 DIAGNOSIS — G5600 Carpal tunnel syndrome, unspecified upper limb: Secondary | ICD-10-CM | POA: Diagnosis not present

## 2018-05-16 DIAGNOSIS — G4733 Obstructive sleep apnea (adult) (pediatric): Secondary | ICD-10-CM | POA: Diagnosis not present

## 2018-05-17 DIAGNOSIS — E291 Testicular hypofunction: Secondary | ICD-10-CM | POA: Diagnosis not present

## 2018-05-22 DIAGNOSIS — G5603 Carpal tunnel syndrome, bilateral upper limbs: Secondary | ICD-10-CM | POA: Diagnosis not present

## 2018-06-05 DIAGNOSIS — E291 Testicular hypofunction: Secondary | ICD-10-CM | POA: Diagnosis not present

## 2018-06-06 DIAGNOSIS — R69 Illness, unspecified: Secondary | ICD-10-CM | POA: Diagnosis not present

## 2018-06-16 DIAGNOSIS — G4733 Obstructive sleep apnea (adult) (pediatric): Secondary | ICD-10-CM | POA: Diagnosis not present

## 2018-06-20 DIAGNOSIS — Z7721 Contact with and (suspected) exposure to potentially hazardous body fluids: Secondary | ICD-10-CM | POA: Diagnosis not present

## 2018-06-22 DIAGNOSIS — G4733 Obstructive sleep apnea (adult) (pediatric): Secondary | ICD-10-CM | POA: Diagnosis not present

## 2018-06-23 DIAGNOSIS — Z7984 Long term (current) use of oral hypoglycemic drugs: Secondary | ICD-10-CM | POA: Diagnosis not present

## 2018-06-23 DIAGNOSIS — E119 Type 2 diabetes mellitus without complications: Secondary | ICD-10-CM | POA: Diagnosis not present

## 2018-06-23 DIAGNOSIS — G4733 Obstructive sleep apnea (adult) (pediatric): Secondary | ICD-10-CM | POA: Diagnosis not present

## 2018-06-23 DIAGNOSIS — G5601 Carpal tunnel syndrome, right upper limb: Secondary | ICD-10-CM | POA: Diagnosis not present

## 2018-06-26 DIAGNOSIS — G5601 Carpal tunnel syndrome, right upper limb: Secondary | ICD-10-CM | POA: Diagnosis not present

## 2018-06-27 DIAGNOSIS — E291 Testicular hypofunction: Secondary | ICD-10-CM | POA: Diagnosis not present

## 2018-07-07 DIAGNOSIS — R42 Dizziness and giddiness: Secondary | ICD-10-CM | POA: Diagnosis not present

## 2018-07-07 DIAGNOSIS — Z9889 Other specified postprocedural states: Secondary | ICD-10-CM | POA: Diagnosis not present

## 2018-07-17 ENCOUNTER — Telehealth: Payer: Self-pay | Admitting: Neurology

## 2018-07-17 NOTE — Telephone Encounter (Signed)
Spoke with Dr. Felecia Shelling- this particular cleaner is newer. Helps destroy bacteria/viruses. Would still need to clean machinery periodically. This is not to replace cleaning with soap/water. Not covered by insurance, he would have to pay out of pocket

## 2018-07-17 NOTE — Telephone Encounter (Signed)
I called and spoke with pt. Relayed message below. He verbalized understanding.

## 2018-07-17 NOTE — Telephone Encounter (Signed)
Pt is wanting to get [employer] CPAP cleaner and sanitizer but he is wanting to know Dr Felecia Shelling opinion if these types of machines work. Please call to advise

## 2018-07-21 DIAGNOSIS — K219 Gastro-esophageal reflux disease without esophagitis: Secondary | ICD-10-CM | POA: Diagnosis not present

## 2018-07-21 DIAGNOSIS — G4733 Obstructive sleep apnea (adult) (pediatric): Secondary | ICD-10-CM | POA: Diagnosis not present

## 2018-07-31 DIAGNOSIS — R69 Illness, unspecified: Secondary | ICD-10-CM | POA: Diagnosis not present

## 2018-08-03 DIAGNOSIS — E291 Testicular hypofunction: Secondary | ICD-10-CM | POA: Diagnosis not present

## 2018-08-13 DIAGNOSIS — R69 Illness, unspecified: Secondary | ICD-10-CM | POA: Diagnosis not present

## 2018-08-14 DIAGNOSIS — R69 Illness, unspecified: Secondary | ICD-10-CM | POA: Diagnosis not present

## 2018-08-14 DIAGNOSIS — Z01818 Encounter for other preprocedural examination: Secondary | ICD-10-CM | POA: Diagnosis not present

## 2018-08-23 DIAGNOSIS — K449 Diaphragmatic hernia without obstruction or gangrene: Secondary | ICD-10-CM | POA: Diagnosis not present

## 2018-08-23 DIAGNOSIS — G4733 Obstructive sleep apnea (adult) (pediatric): Secondary | ICD-10-CM | POA: Diagnosis not present

## 2018-08-23 DIAGNOSIS — Z01818 Encounter for other preprocedural examination: Secondary | ICD-10-CM | POA: Diagnosis not present

## 2018-08-23 DIAGNOSIS — K219 Gastro-esophageal reflux disease without esophagitis: Secondary | ICD-10-CM | POA: Diagnosis not present

## 2018-08-24 DIAGNOSIS — E291 Testicular hypofunction: Secondary | ICD-10-CM | POA: Diagnosis not present

## 2018-08-30 DIAGNOSIS — M7551 Bursitis of right shoulder: Secondary | ICD-10-CM | POA: Diagnosis not present

## 2018-08-30 DIAGNOSIS — M19011 Primary osteoarthritis, right shoulder: Secondary | ICD-10-CM | POA: Diagnosis not present

## 2018-09-01 ENCOUNTER — Ambulatory Visit: Payer: Medicare PPO | Admitting: Neurology

## 2018-09-01 DIAGNOSIS — B349 Viral infection, unspecified: Secondary | ICD-10-CM | POA: Diagnosis not present

## 2018-09-01 DIAGNOSIS — I10 Essential (primary) hypertension: Secondary | ICD-10-CM | POA: Diagnosis not present

## 2018-09-01 DIAGNOSIS — E785 Hyperlipidemia, unspecified: Secondary | ICD-10-CM | POA: Diagnosis not present

## 2018-09-01 DIAGNOSIS — E119 Type 2 diabetes mellitus without complications: Secondary | ICD-10-CM | POA: Diagnosis not present

## 2018-09-08 ENCOUNTER — Telehealth (INDEPENDENT_AMBULATORY_CARE_PROVIDER_SITE_OTHER): Payer: Medicare HMO | Admitting: Cardiovascular Disease

## 2018-09-08 ENCOUNTER — Telehealth: Payer: Self-pay | Admitting: Cardiovascular Disease

## 2018-09-08 ENCOUNTER — Telehealth: Payer: Self-pay

## 2018-09-08 ENCOUNTER — Encounter: Payer: Self-pay | Admitting: Cardiovascular Disease

## 2018-09-08 DIAGNOSIS — R079 Chest pain, unspecified: Secondary | ICD-10-CM | POA: Diagnosis not present

## 2018-09-08 DIAGNOSIS — E782 Mixed hyperlipidemia: Secondary | ICD-10-CM | POA: Diagnosis not present

## 2018-09-08 DIAGNOSIS — E669 Obesity, unspecified: Secondary | ICD-10-CM

## 2018-09-08 DIAGNOSIS — I200 Unstable angina: Secondary | ICD-10-CM | POA: Diagnosis not present

## 2018-09-08 DIAGNOSIS — G4733 Obstructive sleep apnea (adult) (pediatric): Secondary | ICD-10-CM

## 2018-09-08 DIAGNOSIS — E118 Type 2 diabetes mellitus with unspecified complications: Secondary | ICD-10-CM | POA: Diagnosis not present

## 2018-09-08 DIAGNOSIS — E1169 Type 2 diabetes mellitus with other specified complication: Secondary | ICD-10-CM

## 2018-09-08 DIAGNOSIS — Z87891 Personal history of nicotine dependence: Secondary | ICD-10-CM | POA: Diagnosis not present

## 2018-09-08 NOTE — Telephone Encounter (Signed)
lmtcb

## 2018-09-08 NOTE — Telephone Encounter (Signed)
Spoke with patient and he is wanting to be seen. On Wednesday he was working in the yard doing strenuous work. He became dizzy, lightheaded, chest pain/tightness, foggy, and sharp pain left side of neck to jaw.  Today he is still feeling dizzy and continues to have sharp neck/jaw pain at times. He saw his PCP this am who said EKG looked ok but to contact cardiologist for visit. Have requested EKG and office note from Dr Sheryn Bison PCP, may not be ready until next week. Will forward Dr Sallyanne Kuster for review

## 2018-09-08 NOTE — Patient Instructions (Signed)
Medication Instructions:  Dr Sallyanne Kuster has recommended making the following medication changes: 1. START Aspirin 81 mg - take 1 tablet daily  If you need a refill on your cardiac medications before your next appointment, please call your pharmacy.   Testing/Procedures: Your physician has requested that you have a cardiac catheterization. Cardiac catheterization is used to diagnose and/or treat various heart conditions. Doctors may recommend this procedure for a number of different reasons. The most common reason is to evaluate chest pain. Chest pain can be a symptom of coronary artery disease (CAD), and cardiac catheterization can show whether plaque is narrowing or blocking your heart's arteries. This procedure is also used to evaluate the valves, as well as measure the blood flow and oxygen levels in different parts of your heart. For further information please visit [email or web address]. Please follow instruction sheet, as given.  Follow-Up: At [facility], you and your health needs are our priority.  As part of our continuing mission to provide you with exceptional heart care, we have created designated Provider Care Teams.  These Care Teams include your primary Cardiologist (physician) and Advanced Practice Providers (APPs -  Physician Assistants and Nurse Practitioners) who all work together to provide you with the care you need, when you need it. You will need a follow up appointment in 3 months.  Please call our office 2 months in advance to schedule this appointment.  You may see Sanda Klein, MD or one of the following Advanced Practice Providers on your designated Care Team: [place], [place] . Fabian Sharp, PA-C

## 2018-09-08 NOTE — H&P (View-Only) (Signed)
Virtual Visit via Video Note    Evaluation Performed:  Follow-up visit  This visit type was conducted due to national recommendations for restrictions regarding the COVID-19 Pandemic (e.g. social distancing).  This format is felt to be most appropriate for this patient at this time.  All issues noted in this document were discussed and addressed.  No physical exam was performed (except for noted visual exam findings with Video Visits).  Please refer to the patient's chart (MyChart message for video visits and phone note for telephone visits) for the patient's consent to telehealth for [facility].  Date:  09/08/2018   ID:  Stephen Franklin, DOB 07/28/1965, MRN [id number]  Patient Location:  [place] [address]   Provider location:   [place], [place]  PCP:  Rolene Course, PA-C  Cardiologist:  Dmiya Malphrus Electrophysiologist:  None   Chief Complaint:  Precordial hest pain  History of Present Illness:    Stephen Franklin is a 53 y.o. male who presents via audio/video conferencing for a telehealth visit today.    Last week, Stephen Franklin was working hard in the yard and experienced severe retrosternal chest tightness and a burning sensation radiating up the left side of his neck, as well as some left arm numbness.  He stops to rest.  He felt dizzy and sweaty.  The chest pressure and neck burning symptoms gradually subsided over the next 20 minutes, but recurred when he tried to work again.  The neck burning sensation has been occurring repeatedly over the last few days, sometimes with minimal activity.  His chest feels intermittently tight and he feels more short of breath than usual.  He denies palpitations, dizziness, syncope, leg edema, claudication, cough or hemoptysis.  He has not had fever or sick contacts.  He denies upper respiratory tract symptoms.  The patient does not symptoms concerning for COVID-19 infection (fever, chills, cough, or new SHORTNESS OF BREATH).    Prior CV studies:   The following studies were reviewed today:  Stress test 2018. Labs 09/01/2018 Dr. Sheryn Bison  Past Medical History:  Diagnosis Date  . Anxiety   . Diabetes mellitus without complication (HCC)    Type 2  . Dysrhythmia    palpitations  . GERD (gastroesophageal reflux disease)   . High cholesterol   . History of kidney stones   . Hypothyroidism    hx of correcte no meds now  . Sleep apnea    cpap   Past Surgical History:  Procedure Laterality Date  . COLONOSCOPY    . HERNIA REPAIR     Left inguinal, umbilical with mesh [id number] Dr. Redmond Pulling  . UMBILICAL HERNIA REPAIR N/A 06/23/2017   Procedure: LAPAROSCOPIC ASSISTED UMBILICAL HERNIA REPAIR WITH MESH;  Surgeon: Greer Pickerel, MD;  Location: [facility];  Service: General;  Laterality: N/A;     Current Meds  Medication Sig  . buPROPion (WELLBUTRIN SR) 150 MG 12 hr tablet Take 150 mg by mouth daily.  . clonazePAM (KLONOPIN) 1 MG tablet Take 1 mg by mouth 3 (three) times daily as needed (for anxiety).   . fenofibrate 160 MG tablet Take 160 mg by mouth daily.  Marland Kitchen glipiZIDE (GLUCOTROL XL) 5 MG 24 hr tablet Take 1 tablet by mouth daily.  Marland Kitchen ibuprofen (ADVIL,MOTRIN) 200 MG tablet Take 600 mg by mouth every 8 (eight) hours as needed (for back pain & muscle pain/headaches).  . meclizine (ANTIVERT) 12.5 MG tablet Take 12.5 mg by mouth 3 (three) times daily. for 7  days  . metFORMIN (GLUCOPHAGE-XR) 500 MG 24 hr tablet Take 500 mg by mouth 2 (two) times daily.  . Omega-3 Fatty Acids (FISH OIL) 1000 MG CAPS Take 1,000 mg by mouth daily.   Marland Kitchen omeprazole (PRILOSEC) 40 MG capsule Take 1 capsule by mouth daily.  . pravastatin (PRAVACHOL) 40 MG tablet Take 40 mg by mouth daily.  Marland Kitchen rOPINIRole (REQUIP) 1 MG tablet Take 1 tablet by mouth 3 (three) times daily.  . sertraline (ZOLOFT) 100 MG tablet Take 200 mg by mouth daily.   Marland Kitchen testosterone cypionate (DEPOTESTOSTERONE CYPIONATE) 200 MG/ML injection Inject 1 mL into the muscle.  . traMADol  (ULTRAM) 50 MG tablet Take 1 tablet by mouth 4 (four) times daily as needed.  . TURMERIC PO Take 1,000 mg by mouth daily.     Allergies:   Naproxen sodium and Bee venom   Social History   Tobacco Use  . Smoking status: Former Smoker    Last attempt to quit: 06/21/2010    Years since quitting: 8.2  . Smokeless tobacco: Former Systems developer    Quit date: 06/13/2010  Substance Use Topics  . Alcohol use: No    Comment: hx of none in 7-8 years  . Drug use: No     Family Hx: The patient's family history includes Breast cancer in his mother; Diabetes in his mother; Heart failure in his mother; Lung cancer in his father. He was adopted.  ROS:   Please see the history of present illness.     All other systems reviewed and are negative.   Labs/Other Tests and Data Reviewed:    Recent Labs: 09/15/2017: TSH 3.660   Recent Lipid Panel Lab Results  Component Value Date/Time   CHOL 174 01/02/2010 12:37 PM   TRIG 298.0 (H) 01/02/2010 12:37 PM   HDL 30.80 (L) 01/02/2010 12:37 PM   CHOLHDL 6 01/02/2010 12:37 PM   LDLDIRECT 95.1 01/02/2010 12:37 PM    Labs in September 01, 2018 labs show hemoglobin A1c 7.6%, total cholesterol 135, HDL 26, LDL 38, triglycerides 355, creatinine 1.25, potassium 3.9, hemoglobin 14.1.   Wt Readings from Last 3 Encounters:  11/15/17 (!) 308 lb (139.7 kg)  08/30/17 (!) 305 lb 8 oz (138.6 kg)  06/23/17 300 lb (136.1 kg)     Exam:    Vital Signs:  BP 137/82   Pulse 79   Ht 5\' 11"  (1.803 m)   Wt (!) 315 lb (142.9 kg)   BMI 43.93 kg/m    Morbidly obese, well developed male in no acute distress.   ASSESSMENT & PLAN:    1.  Unstable angina: He has multiple risk factors for coronary artery disease including severe mixed dyslipidemia (very low HDL, high triglycerides, but well-controlled LDL cholesterol), uncontrolled diabetes mellitus, history of previous smoking, but now quit.  His symptoms are concerning for unstable angina and especially under the current  healthcare restriction conditions I do not think we should waste time and increase exposure with medial stress tests or coronary CT angiography.  I think he should go straight to cardiac catheterization.  His body habitus was also make less invasive tests likely to be difficult to interpret and coronary angiography is probably the best next step anyway.  Asked him to make sure he is taking aspirin 81 mg daily, continue his metoprolol, hold metformin for cardiac catheterization. With a radial approach catheterization, he will go home the same day if he has normal angiographic findings, and might even go home same day  if he has an uncomplicated percutaneous revascularization procedure.  He understands that he may have to spend the night. This procedure has been fully reviewed with the patient and written informed consent has been obtained. 2. HLP: continue statin. HDL and TG should improve with better glycemic control. 3. DM: Target hemoglobin A1c should be less than 7%.  He is taking 2 agents now.  Consider switching from sulfonylurea or adding SGLT2 inhibitor such as Jardiance or Wilder Glade, if this is financially feasible. 4.  Obesity: He is planning a gastric sleeve procedure, which I think is a great idea for his long-term health, but I think we need to first clarify his coronary status.  He understands that if he receives a stent he will require dual antiplatelet therapy for about 12 months and his bariatric surgery will have to be postponed.  This procedure has been fully reviewed with the patient and written informed consent has been obtained.   COVID-19 Education: The signs and symptoms of COVID-19 were discussed with the patient and how to seek care for testing (follow up with PCP or arrange E-visit).  The importance of social distancing was discussed today.  Patient Risk:   After full review of this patients clinical status, I feel that they are at least moderate risk at this time.  Time:    Today, I have spent 35 minutes with the patient with telehealth technology discussing CAD, unstable angina, cardiac catheterization and possible angioplasty/stent. This procedure has been fully reviewed with the patient and written informed consent has been obtained. .     Medication Adjustments/Labs and Tests Ordered: Current medicines are reviewed at length with the patient today.  Concerns regarding medicines are outlined above.  Tests Ordered: No orders of the defined types were placed in this encounter.  Medication Changes: No orders of the defined types were placed in this encounter.   Disposition:  3 months or sooner, depending on cath results  Signed, Sanda Klein, MD  09/08/2018 12:50 PM    [place]

## 2018-09-08 NOTE — Telephone Encounter (Signed)
Patient is calling you back. 

## 2018-09-08 NOTE — Telephone Encounter (Signed)
Concerning for unstable angina. Made a e-video visit and will schedule for cath next week

## 2018-09-08 NOTE — Telephone Encounter (Signed)
New Message:    Patient calling to see if he can get a appt. Patient has not been feeling to good. Patient last saw Dr. Sallyanne Kuster 2017. Please call patient for appt.

## 2018-09-08 NOTE — Progress Notes (Signed)
Virtual Visit via Video Note    Evaluation Performed:  Follow-up visit  This visit type was conducted due to national recommendations for restrictions regarding the COVID-19 Pandemic (e.g. social distancing).  This format is felt to be most appropriate for this patient at this time.  All issues noted in this document were discussed and addressed.  No physical exam was performed (except for noted visual exam findings with Video Visits).  Please refer to the patient's chart (MyChart message for video visits and phone note for telephone visits) for the patient's consent to telehealth for [facility].  Date:  09/08/2018   ID:  Stephen Franklin, DOB 24-Oct-1965, MRN [id number]  Patient Location:  [place] [address]   Provider location:   [place], [place]  PCP:  Rolene Course, PA-C  Cardiologist:  Arley Garant Electrophysiologist:  None   Chief Complaint:  Precordial hest pain  History of Present Illness:    Stephen Franklin is a 53 y.o. male who presents via audio/video conferencing for a telehealth visit today.    Last week, Marden Noble was working hard in the yard and experienced severe retrosternal chest tightness and a burning sensation radiating up the left side of his neck, as well as some left arm numbness.  He stops to rest.  He felt dizzy and sweaty.  The chest pressure and neck burning symptoms gradually subsided over the next 20 minutes, but recurred when he tried to work again.  The neck burning sensation has been occurring repeatedly over the last few days, sometimes with minimal activity.  His chest feels intermittently tight and he feels more short of breath than usual.  He denies palpitations, dizziness, syncope, leg edema, claudication, cough or hemoptysis.  He has not had fever or sick contacts.  He denies upper respiratory tract symptoms.  The patient does not symptoms concerning for COVID-19 infection (fever, chills, cough, or new SHORTNESS OF BREATH).    Prior CV studies:   The following studies were reviewed today:  Stress test 2018. Labs 09/01/2018 Dr. Sheryn Bison  Past Medical History:  Diagnosis Date  . Anxiety   . Diabetes mellitus without complication (HCC)    Type 2  . Dysrhythmia    palpitations  . GERD (gastroesophageal reflux disease)   . High cholesterol   . History of kidney stones   . Hypothyroidism    hx of correcte no meds now  . Sleep apnea    cpap   Past Surgical History:  Procedure Laterality Date  . COLONOSCOPY    . HERNIA REPAIR     Left inguinal, umbilical with mesh [id number] Dr. Redmond Pulling  . UMBILICAL HERNIA REPAIR N/A 06/23/2017   Procedure: LAPAROSCOPIC ASSISTED UMBILICAL HERNIA REPAIR WITH MESH;  Surgeon: Greer Pickerel, MD;  Location: [facility];  Service: General;  Laterality: N/A;     Current Meds  Medication Sig  . buPROPion (WELLBUTRIN SR) 150 MG 12 hr tablet Take 150 mg by mouth daily.  . clonazePAM (KLONOPIN) 1 MG tablet Take 1 mg by mouth 3 (three) times daily as needed (for anxiety).   . fenofibrate 160 MG tablet Take 160 mg by mouth daily.  Marland Kitchen glipiZIDE (GLUCOTROL XL) 5 MG 24 hr tablet Take 1 tablet by mouth daily.  Marland Kitchen ibuprofen (ADVIL,MOTRIN) 200 MG tablet Take 600 mg by mouth every 8 (eight) hours as needed (for back pain & muscle pain/headaches).  . meclizine (ANTIVERT) 12.5 MG tablet Take 12.5 mg by mouth 3 (three) times daily. for 7  days  . metFORMIN (GLUCOPHAGE-XR) 500 MG 24 hr tablet Take 500 mg by mouth 2 (two) times daily.  . Omega-3 Fatty Acids (FISH OIL) 1000 MG CAPS Take 1,000 mg by mouth daily.   Marland Kitchen omeprazole (PRILOSEC) 40 MG capsule Take 1 capsule by mouth daily.  . pravastatin (PRAVACHOL) 40 MG tablet Take 40 mg by mouth daily.  Marland Kitchen rOPINIRole (REQUIP) 1 MG tablet Take 1 tablet by mouth 3 (three) times daily.  . sertraline (ZOLOFT) 100 MG tablet Take 200 mg by mouth daily.   Marland Kitchen testosterone cypionate (DEPOTESTOSTERONE CYPIONATE) 200 MG/ML injection Inject 1 mL into the muscle.  . traMADol  (ULTRAM) 50 MG tablet Take 1 tablet by mouth 4 (four) times daily as needed.  . TURMERIC PO Take 1,000 mg by mouth daily.     Allergies:   Naproxen sodium and Bee venom   Social History   Tobacco Use  . Smoking status: Former Smoker    Last attempt to quit: 06/21/2010    Years since quitting: 8.2  . Smokeless tobacco: Former Systems developer    Quit date: 06/13/2010  Substance Use Topics  . Alcohol use: No    Comment: hx of none in 7-8 years  . Drug use: No     Family Hx: The patient's family history includes Breast cancer in his mother; Diabetes in his mother; Heart failure in his mother; Lung cancer in his father. He was adopted.  ROS:   Please see the history of present illness.     All other systems reviewed and are negative.   Labs/Other Tests and Data Reviewed:    Recent Labs: 09/15/2017: TSH 3.660   Recent Lipid Panel Lab Results  Component Value Date/Time   CHOL 174 01/02/2010 12:37 PM   TRIG 298.0 (H) 01/02/2010 12:37 PM   HDL 30.80 (L) 01/02/2010 12:37 PM   CHOLHDL 6 01/02/2010 12:37 PM   LDLDIRECT 95.1 01/02/2010 12:37 PM    Labs in September 01, 2018 labs show hemoglobin A1c 7.6%, total cholesterol 135, HDL 26, LDL 38, triglycerides 355, creatinine 1.25, potassium 3.9, hemoglobin 14.1.   Wt Readings from Last 3 Encounters:  11/15/17 (!) 308 lb (139.7 kg)  08/30/17 (!) 305 lb 8 oz (138.6 kg)  06/23/17 300 lb (136.1 kg)     Exam:    Vital Signs:  BP 137/82   Pulse 79   Ht 5\' 11"  (1.803 m)   Wt (!) 315 lb (142.9 kg)   BMI 43.93 kg/m    Morbidly obese, well developed male in no acute distress.   ASSESSMENT & PLAN:    1.  Unstable angina: He has multiple risk factors for coronary artery disease including severe mixed dyslipidemia (very low HDL, high triglycerides, but well-controlled LDL cholesterol), uncontrolled diabetes mellitus, history of previous smoking, but now quit.  His symptoms are concerning for unstable angina and especially under the current  healthcare restriction conditions I do not think we should waste time and increase exposure with medial stress tests or coronary CT angiography.  I think he should go straight to cardiac catheterization.  His body habitus was also make less invasive tests likely to be difficult to interpret and coronary angiography is probably the best next step anyway.  Asked him to make sure he is taking aspirin 81 mg daily, continue his metoprolol, hold metformin for cardiac catheterization. With a radial approach catheterization, he will go home the same day if he has normal angiographic findings, and might even go home same day  if he has an uncomplicated percutaneous revascularization procedure.  He understands that he may have to spend the night. This procedure has been fully reviewed with the patient and written informed consent has been obtained. 2. HLP: continue statin. HDL and TG should improve with better glycemic control. 3. DM: Target hemoglobin A1c should be less than 7%.  He is taking 2 agents now.  Consider switching from sulfonylurea or adding SGLT2 inhibitor such as Jardiance or Wilder Glade, if this is financially feasible. 4.  Obesity: He is planning a gastric sleeve procedure, which I think is a great idea for his long-term health, but I think we need to first clarify his coronary status.  He understands that if he receives a stent he will require dual antiplatelet therapy for about 12 months and his bariatric surgery will have to be postponed.  This procedure has been fully reviewed with the patient and written informed consent has been obtained.   COVID-19 Education: The signs and symptoms of COVID-19 were discussed with the patient and how to seek care for testing (follow up with PCP or arrange E-visit).  The importance of social distancing was discussed today.  Patient Risk:   After full review of this patients clinical status, I feel that they are at least moderate risk at this time.  Time:    Today, I have spent 35 minutes with the patient with telehealth technology discussing CAD, unstable angina, cardiac catheterization and possible angioplasty/stent. This procedure has been fully reviewed with the patient and written informed consent has been obtained. .     Medication Adjustments/Labs and Tests Ordered: Current medicines are reviewed at length with the patient today.  Concerns regarding medicines are outlined above.  Tests Ordered: No orders of the defined types were placed in this encounter.  Medication Changes: No orders of the defined types were placed in this encounter.   Disposition:  3 months or sooner, depending on cath results  Signed, Sanda Klein, MD  09/08/2018 12:50 PM    [place]

## 2018-09-11 ENCOUNTER — Telehealth: Payer: Self-pay | Admitting: *Deleted

## 2018-09-11 NOTE — Telephone Encounter (Signed)
Returned call to patient and reviewed cath instructions for tomorrow. Patient verbalized understanding and agreed w/ plan. Read back instructions for complete understanding. Encouraged patient to sign up for mychart so I could send his instruction letter if he had any questions later. Resent activation information.

## 2018-09-11 NOTE — Telephone Encounter (Signed)
Pt contacted pre-catheterization scheduled at [facility] for: Tuesday September 12, 2018 7:30 AM Verified arrival time and place: [facility] Main Entrance A at:5:30 AM  No solid food after midnight prior to cath, clear liquids until 5 AM day of procedure. Contrast allergy: no  Hold: Glipidzide-AM of procedure. Metformin-day of procedure and 48 hours post procedure.  Except hold medications AM meds can be  taken pre-cath with sip of water including: ASA 81 mg  Confirmed patient has responsible person to drive home post procedure and observe 24 hours after arriving home:yes  Pt advised due to Covid-19 pandemic, [facility] is restricting visitors and only patients should present for check-in prior to their procedure. People will not be allowed to enter [facility] with the patient. At this time [facility] is not allowing visitors to all [facility] campuses. This is to protect patients and staff from spreading COVID-19.  For everyone's safety, all patients entering [place] should expect to be screened.  At discharge, patient will be taken to the lobby Main Entrance A, his driver can pull vehicle to entrance Main Entrance A, and patient will be assisted to vehicle.  Patient advised patient's care can be discussed remotely. If a patient requests/gives permission, provider can discuss patient's care by remote communication with family/caregiver.     Cardiac Questionnaire: _____________   ESLPN-30 Pre-Screening Questions:  . Do you currently have a fever? no . Have you recently travelled on a cruise, internationally, or to [place], [place], [place], [place], [place], or [place], [place] [employer]) ? no . Have you been in contact with someone that is currently pending confirmation of Covid19 testing or has been confirmed to have the [place] virus?  no . Are you currently experiencing fatigue or cough? no . Are you currently experiencing new or worsening shortness of breath at rest or with  minimal activity?-discussed with Dr Sallyanne Kuster -feels cardiac related -see office note 09/08/18. Marland Kitchen Have you been in contact with someone that was recently sick with fever/cough/fatigue? no   I reviewed procedure instructions, visitor restrictions, and screening questions with patient, he verbalized understanding,thanked me for call.

## 2018-09-12 ENCOUNTER — Encounter (HOSPITAL_COMMUNITY): Payer: Self-pay | Admitting: Cardiology

## 2018-09-12 ENCOUNTER — Other Ambulatory Visit: Payer: Self-pay

## 2018-09-12 ENCOUNTER — Encounter (HOSPITAL_COMMUNITY)
Admission: RE | Disposition: A | Discharge status: Home / Self Care | Payer: Self-pay | Source: Home / Self Care | Attending: Cardiology

## 2018-09-12 ENCOUNTER — Ambulatory Visit (HOSPITAL_COMMUNITY)
Admission: RE | Admit: 2018-09-12 | Discharge: 2018-09-12 | Disposition: A | Discharge status: Home / Self Care | Payer: Medicare HMO | Attending: Cardiology | Admitting: Cardiology

## 2018-09-12 DIAGNOSIS — E7800 Pure hypercholesterolemia, unspecified: Secondary | ICD-10-CM | POA: Insufficient documentation

## 2018-09-12 DIAGNOSIS — I25110 Atherosclerotic heart disease of native coronary artery with unstable angina pectoris: Secondary | ICD-10-CM | POA: Insufficient documentation

## 2018-09-12 DIAGNOSIS — K219 Gastro-esophageal reflux disease without esophagitis: Secondary | ICD-10-CM | POA: Diagnosis not present

## 2018-09-12 DIAGNOSIS — E669 Obesity, unspecified: Secondary | ICD-10-CM | POA: Diagnosis not present

## 2018-09-12 DIAGNOSIS — E119 Type 2 diabetes mellitus without complications: Secondary | ICD-10-CM | POA: Diagnosis not present

## 2018-09-12 DIAGNOSIS — Z6841 Body Mass Index (BMI) 40.0 and over, adult: Secondary | ICD-10-CM | POA: Diagnosis present

## 2018-09-12 DIAGNOSIS — E785 Hyperlipidemia, unspecified: Secondary | ICD-10-CM | POA: Diagnosis not present

## 2018-09-12 DIAGNOSIS — G4730 Sleep apnea, unspecified: Secondary | ICD-10-CM | POA: Insufficient documentation

## 2018-09-12 DIAGNOSIS — R69 Illness, unspecified: Secondary | ICD-10-CM | POA: Diagnosis not present

## 2018-09-12 DIAGNOSIS — E039 Hypothyroidism, unspecified: Secondary | ICD-10-CM | POA: Diagnosis not present

## 2018-09-12 DIAGNOSIS — Z7982 Long term (current) use of aspirin: Secondary | ICD-10-CM | POA: Diagnosis not present

## 2018-09-12 DIAGNOSIS — Z7984 Long term (current) use of oral hypoglycemic drugs: Secondary | ICD-10-CM | POA: Insufficient documentation

## 2018-09-12 DIAGNOSIS — Z87891 Personal history of nicotine dependence: Secondary | ICD-10-CM | POA: Insufficient documentation

## 2018-09-12 DIAGNOSIS — Z791 Long term (current) use of non-steroidal anti-inflammatories (NSAID): Secondary | ICD-10-CM | POA: Insufficient documentation

## 2018-09-12 DIAGNOSIS — E781 Pure hyperglyceridemia: Secondary | ICD-10-CM | POA: Diagnosis present

## 2018-09-12 DIAGNOSIS — Z79899 Other long term (current) drug therapy: Secondary | ICD-10-CM | POA: Insufficient documentation

## 2018-09-12 DIAGNOSIS — F419 Anxiety disorder, unspecified: Secondary | ICD-10-CM | POA: Insufficient documentation

## 2018-09-12 DIAGNOSIS — I200 Unstable angina: Secondary | ICD-10-CM | POA: Diagnosis present

## 2018-09-12 DIAGNOSIS — G4733 Obstructive sleep apnea (adult) (pediatric): Secondary | ICD-10-CM | POA: Diagnosis present

## 2018-09-12 HISTORY — PX: LEFT HEART CATH AND CORONARY ANGIOGRAPHY: CATH118249

## 2018-09-12 LAB — BASIC METABOLIC PANEL
ANION GAP: 9 (ref 5–15)
BUN: 12 mg/dL (ref 6–20)
CALCIUM: 9 mg/dL (ref 8.9–10.3)
CO2: 22 mmol/L (ref 22–32)
CREATININE: 1.17 mg/dL (ref 0.61–1.24)
Chloride: 106 mmol/L (ref 98–111)
Glucose, Bld: 198 mg/dL — ABNORMAL HIGH (ref 70–99)
Potassium: 4.2 mmol/L (ref 3.5–5.1)
Sodium: 137 mmol/L (ref 135–145)

## 2018-09-12 LAB — CBC
HCT: 45.9 % (ref 39.0–52.0)
Hemoglobin: 14.6 g/dL (ref 13.0–17.0)
MCH: 30.1 pg (ref 26.0–34.0)
MCHC: 31.8 g/dL (ref 30.0–36.0)
MCV: 94.6 fL (ref 80.0–100.0)
NRBC: 0 % (ref 0.0–0.2)
PLATELETS: 195 10*3/uL (ref 150–400)
RBC: 4.85 MIL/uL (ref 4.22–5.81)
RDW: 13.3 % (ref 11.5–15.5)
WBC: 7 10*3/uL (ref 4.0–10.5)

## 2018-09-12 LAB — GLUCOSE, CAPILLARY: Glucose-Capillary: 214 mg/dL — ABNORMAL HIGH (ref 70–99)

## 2018-09-12 SURGERY — LEFT HEART CATH AND CORONARY ANGIOGRAPHY
Anesthesia: LOCAL

## 2018-09-12 MED ORDER — HEPARIN (PORCINE) IN NACL 1000-0.9 UT/500ML-% IV SOLN
INTRAVENOUS | Status: AC
  Filled 2018-09-12: qty 1000

## 2018-09-12 MED ORDER — SODIUM CHLORIDE 0.9% FLUSH: 3.0000 mL | Freq: Two times a day (BID) | INTRAVENOUS | Status: DC

## 2018-09-12 MED ORDER — HEPARIN (PORCINE) IN NACL 1000-0.9 UT/500ML-% IV SOLN
INTRAVENOUS | Status: DC | PRN
  Administered 2018-09-12 (×2): 500 mL

## 2018-09-12 MED ORDER — VERAPAMIL HCL 2.5 MG/ML IV SOLN
INTRAVENOUS | Status: DC | PRN
  Administered 2018-09-12: 10 mL via INTRA_ARTERIAL

## 2018-09-12 MED ORDER — MIDAZOLAM HCL 2 MG/2ML IJ SOLN
INTRAMUSCULAR | Status: AC
  Filled 2018-09-12: qty 2

## 2018-09-12 MED ORDER — ACETAMINOPHEN 325 MG PO TABS: 650.0000 mg | ORAL_TABLET | ORAL | Status: DC | PRN

## 2018-09-12 MED ORDER — MIDAZOLAM HCL 2 MG/2ML IJ SOLN
INTRAMUSCULAR | Status: DC | PRN
  Administered 2018-09-12 (×2): 1 mg via INTRAVENOUS

## 2018-09-12 MED ORDER — SODIUM CHLORIDE 0.9 % WEIGHT BASED INFUSION: 1.0000 mL/kg/h | INTRAVENOUS | Status: DC

## 2018-09-12 MED ORDER — SODIUM CHLORIDE 0.9 % WEIGHT BASED INFUSION
3.0000 mL/kg/h | INTRAVENOUS | Status: AC
  Administered 2018-09-12: 3 mL/kg/h via INTRAVENOUS

## 2018-09-12 MED ORDER — HEPARIN SODIUM (PORCINE) 1000 UNIT/ML IJ SOLN
INTRAMUSCULAR | Status: AC
  Filled 2018-09-12: qty 1

## 2018-09-12 MED ORDER — VERAPAMIL HCL 2.5 MG/ML IV SOLN
INTRAVENOUS | Status: AC
  Filled 2018-09-12: qty 2

## 2018-09-12 MED ORDER — LIDOCAINE HCL (PF) 1 % IJ SOLN
INTRAMUSCULAR | Status: AC
  Filled 2018-09-12: qty 30

## 2018-09-12 MED ORDER — SODIUM CHLORIDE 0.9% FLUSH: 3.0000 mL | INTRAVENOUS | Status: DC | PRN

## 2018-09-12 MED ORDER — HEPARIN SODIUM (PORCINE) 1000 UNIT/ML IJ SOLN
INTRAMUSCULAR | Status: DC | PRN
  Administered 2018-09-12: 7000 [IU] via INTRAVENOUS

## 2018-09-12 MED ORDER — ASPIRIN 81 MG PO CHEW: 81.0000 mg | CHEWABLE_TABLET | ORAL | Status: AC

## 2018-09-12 MED ORDER — IOHEXOL 350 MG/ML SOLN
INTRAVENOUS | Status: DC | PRN
  Administered 2018-09-12: 90 mL via INTRA_ARTERIAL

## 2018-09-12 MED ORDER — ONDANSETRON HCL 4 MG/2ML IJ SOLN: 4.0000 mg | Freq: Four times a day (QID) | INTRAMUSCULAR | Status: DC | PRN

## 2018-09-12 MED ORDER — FENTANYL CITRATE (PF) 100 MCG/2ML IJ SOLN
INTRAMUSCULAR | Status: DC | PRN
  Administered 2018-09-12 (×2): 25 ug via INTRAVENOUS

## 2018-09-12 MED ORDER — SODIUM CHLORIDE 0.9 % IV SOLN: 250.0000 mL | INTRAVENOUS | Status: DC | PRN

## 2018-09-12 MED ORDER — LIDOCAINE HCL (PF) 1 % IJ SOLN
INTRAMUSCULAR | Status: DC | PRN
  Administered 2018-09-12: 2 mL

## 2018-09-12 MED ORDER — FENTANYL CITRATE (PF) 100 MCG/2ML IJ SOLN
INTRAMUSCULAR | Status: AC
  Filled 2018-09-12: qty 2

## 2018-09-12 MED ORDER — METFORMIN HCL ER 500 MG PO TB24: 1000.0000 mg | ORAL_TABLET | Freq: Two times a day (BID) | ORAL | 3 refills | Status: DC

## 2018-09-12 MED ORDER — SODIUM CHLORIDE 0.9 % WEIGHT BASED INFUSION: 1.0000 mL/kg/h | INTRAVENOUS | Status: AC

## 2018-09-12 SURGICAL SUPPLY — 10 items
CATH 5FR JL3.5 JR4 ANG PIG MP (CATHETERS) ×2 IMPLANT
DEVICE RAD COMP TR BAND LRG (VASCULAR PRODUCTS) ×2 IMPLANT
GLIDESHEATH SLEND SS 6F .021 (SHEATH) ×2 IMPLANT
GUIDEWIRE INQWIRE 1.5J.035X260 (WIRE) ×1 IMPLANT
INQWIRE 1.5J .035X260CM (WIRE) ×2
KIT HEART LEFT (KITS) ×2 IMPLANT
PACK CARDIAC CATHETERIZATION (CUSTOM PROCEDURE TRAY) ×2 IMPLANT
SYR MEDRAD MARK 7 150ML (SYRINGE) ×2 IMPLANT
TRANSDUCER W/STOPCOCK (MISCELLANEOUS) ×2 IMPLANT
TUBING CIL FLEX 10 FLL-RA (TUBING) ×2 IMPLANT

## 2018-09-12 NOTE — Progress Notes (Signed)
Thank you. Sorry, but he sounded convincing by video-conference... MCr

## 2018-09-12 NOTE — Progress Notes (Signed)
Spoke with wife Jody over telephone and reviewed discharge instructions including medication changes

## 2018-09-12 NOTE — Discharge Instructions (Signed)
Radial Site Care ° °This sheet gives you information about how to care for yourself after your procedure. Your health care provider may also give you more specific instructions. If you have problems or questions, contact your health care provider. °What can I expect after the procedure? °After the procedure, it is common to have: °· Bruising and tenderness at the catheter insertion area. °Follow these instructions at home: °Medicines °· Take over-the-counter and prescription medicines only as told by your health care provider. °Insertion site care °· Follow instructions from your health care provider about how to take care of your insertion site. Make sure you: °? Wash your hands with soap and water before you change your bandage (dressing). If soap and water are not available, use hand sanitizer. °? Change your dressing as told by your health care provider. °? Leave stitches (sutures), skin glue, or adhesive strips in place. These skin closures may need to stay in place for 2 weeks or longer. If adhesive strip edges start to loosen and curl up, you may trim the loose edges. Do not remove adhesive strips completely unless your health care provider tells you to do that. °· Check your insertion site every day for signs of infection. Check for: °? Redness, swelling, or pain. °? Fluid or blood. °? Pus or a bad smell. °? Warmth. °· Do not take baths, swim, or use a hot tub until your health care provider approves. °· You may shower 24-48 hours after the procedure, or as directed by your health care provider. °? Remove the dressing and gently wash the site with plain soap and water. °? Pat the area dry with a clean towel. °? Do not rub the site. That could cause bleeding. °· Do not apply powder or lotion to the site. °Activity ° °· For 24 hours after the procedure, or as directed by your health care provider: °? Do not flex or bend the affected arm. °? Do not push or pull heavy objects with the affected arm. °? Do not  drive yourself home from the hospital or clinic. You may drive 24 hours after the procedure unless your health care provider tells you not to. °? Do not operate machinery or power tools. °· Do not lift anything that is heavier than 10 lb (4.5 kg), or the limit that you are told, until your health care provider says that it is safe. °· Ask your health care provider when it is okay to: °? Return to work or school. °? Resume usual physical activities or sports. °? Resume sexual activity. °General instructions °· If the catheter site starts to bleed, raise your arm and put firm pressure on the site. If the bleeding does not stop, get help right away. This is a medical emergency. °· If you went home on the same day as your procedure, a responsible adult should be with you for the first 24 hours after you arrive home. °· Keep all follow-up visits as told by your health care provider. This is important. °Contact a health care provider if: °· You have a fever. °· You have redness, swelling, or yellow drainage around your insertion site. °Get help right away if: °· You have unusual pain at the radial site. °· The catheter insertion area swells very fast. °· The insertion area is bleeding, and the bleeding does not stop when you hold steady pressure on the area. °· Your arm or hand becomes pale, cool, tingly, or numb. °These symptoms may represent a serious problem   that is an emergency. Do not wait to see if the symptoms will go away. Get medical help right away. Call your local emergency services (911 in the [place] Do not drive yourself to the hospital. °Summary °· After the procedure, it is common to have bruising and tenderness at the site. °· Follow instructions from your health care provider about how to take care of your radial site wound. Check the wound every day for signs of infection. °· Do not lift anything that is heavier than 10 lb (4.5 kg), or the limit that you are told, until your health care provider says  that it is safe. °This information is not intended to replace advice given to you by your health care provider. Make sure you discuss any questions you have with your health care provider. °Document Released: 07/03/2010 Document Revised: 07/06/2017 Document Reviewed: 07/06/2017 °Elsevier Interactive Patient Education © 2019 Elsevier Inc. ° °

## 2018-09-12 NOTE — Interval H&P Note (Signed)
History and Physical Interval Note:  09/12/2018 7:11 AM  Stephen Franklin  has presented today for surgery, with the diagnosis of Unstable angina.  The various methods of treatment have been discussed with the patient and family. After consideration of risks, benefits and other options for treatment, the patient has consented to  Procedure(s): LEFT HEART CATH AND CORONARY ANGIOGRAPHY (N/A) as a surgical intervention.  The patient's history has been reviewed, patient examined, no change in status, stable for surgery.  I have reviewed the patient's chart and labs.  Questions were answered to the patient's satisfaction.   Cath Lab Visit (complete for each Cath Lab visit)  Clinical Evaluation Leading to the Procedure:   ACS: Yes.    Non-ACS:    Anginal Classification: CCS III  Anti-ischemic medical therapy: Minimal Therapy (1 class of medications)  Non-Invasive Test Results: No non-invasive testing performed  Prior CABG: No previous CABG        [facility] 09/12/2018 7:11 AM

## 2018-09-18 ENCOUNTER — Telehealth: Payer: Self-pay | Admitting: *Deleted

## 2018-09-18 DIAGNOSIS — E291 Testicular hypofunction: Secondary | ICD-10-CM | POA: Diagnosis not present

## 2018-09-18 NOTE — Telephone Encounter (Signed)
Called, LVM for pt to call office back. I need to go over his chart info prior to his virtual visit tomorrow with Dr. Felecia Shelling.  Gave [facility] phone number.

## 2018-09-19 ENCOUNTER — Encounter: Payer: Self-pay | Admitting: Neurology

## 2018-09-19 ENCOUNTER — Other Ambulatory Visit: Payer: Self-pay

## 2018-09-19 ENCOUNTER — Ambulatory Visit (INDEPENDENT_AMBULATORY_CARE_PROVIDER_SITE_OTHER): Payer: Medicare HMO | Admitting: Neurology

## 2018-09-19 DIAGNOSIS — G4733 Obstructive sleep apnea (adult) (pediatric): Secondary | ICD-10-CM

## 2018-09-19 DIAGNOSIS — G2581 Restless legs syndrome: Secondary | ICD-10-CM

## 2018-09-19 DIAGNOSIS — Z6841 Body Mass Index (BMI) 40.0 and over, adult: Secondary | ICD-10-CM

## 2018-09-19 DIAGNOSIS — G4719 Other hypersomnia: Secondary | ICD-10-CM | POA: Diagnosis not present

## 2018-09-19 NOTE — Progress Notes (Signed)
[facility]  PATIENT: Stephen Franklin DOB: Aug 26, 1965  REFERRING DOCTOR OR PCP:  Starlyn Skeans, PA-C SOURCE: Patient, notes from [facility], downloads from the CPAP.  _________________________________   HISTORICAL  CHIEF COMPLAINT:  No chief complaint on file.   HISTORY OF PRESENT ILLNESS:  Stephen Franklin is a 53 yo man with obstructive sleep apnea and memory difficulty  Update 09/19/2018: Virtual Visit via Video Note I connected with Stephen Franklin on 09/19/18 at  3:00 PM EDT by a video enabled telemedicine application and verified that I am speaking with the correct person using two identifiers.   I discussed the limitations of evaluation and management by telemedicine and the availability of in person appointments. The patient expressed understanding and agreed to proceed.  History of Present Illness: He feels he is sleeping well most nights though will have a night or two a week with lower quality of sleep.     He uses CPAP nightly.   H no longer wakes up in the middle of the night when there was leakage.  He does better if he shaves at night and washes his face before he puts the mask on.   He uses a FF mask.   He has only occasional nights he dozes off watching TV and never has EDS during the day (ESS=1).   He also has restless leg syndrome and ropinirole is helping that.  He is planning on having a gastric sleeve operation and is undergoing evaluation and making dietary changes.   He had some chest tightness while doing yardwork a couple weeks ago and had a cardiac catheterization --- he was told that he had only minimal buildup in the arteries.   Mood is doing well.   He feels his memory is doing better than a couple years ago.   Observations/Objective: He is an overweight man in no acute distress.  The head is normocephalic and atraumatic.  The sclera are anicteric.  Pharynx is not erythematous.  Tongue appears normal.  Visible skin appears  normal.  He is alert and fully oriented with fluent speech and good attention, focus and memory.  Extraocular muscles are intact.  Facial strength is normal.  Palatal elevation and tongue protrusion is midline.  Trapezius strength is normal.  Hearing appears normal.  Arm strength and coordination appears normal.  Sensory and reflexes could not be tested today.  Assessment and Plan: Obstructive sleep apnea  Excessive daytime sleepiness  Class 3 severe obesity due to excess calories with serious comorbidity and body mass index (BMI) of 40.0 to 44.9 in adult [facility])  Restless leg syndrome  1.   He will continue to use CPAP on a nightly basis.  We will work with the DME company to make sure that he has supplies as needed. 2.   Continue ropinirole for restless leg syndrome.  Continue other medications. 3.   Hopefully he will be able to lose a fair amount of weight with a gastric sleeve procedure.  If that occurs we can likely reduce the pressures of CPAP and possibly consider a retest if weight is stable 4.    He will return to see [place] in 1 year or sooner if there are new or worsening neurologic symptoms.  Follow Up Instructions: I discussed the assessment and treatment plan with the patient. The patient was provided an opportunity to ask questions and all were answered. The patient agreed with the plan and demonstrated an understanding of the instructions.   The patient  was advised to call back or seek an in-person evaluation if the symptoms worsen or if the condition fails to improve as anticipated.  I provided 25 minutes of non-face-to-face time during this encounter.  _________________________________ From previous visits: Update 11/15/2017: He has his new CPAP machine.  He likes it much better than the one in the past and is able to use it on a nightly basis.  He feels much better the next day when he uses CPAP for the entire night.  The download shows excellent compliance (93% > 4 hrs and 100%  night).  AHI = 3.4  He feels his memory is better, back to baseline.   He denies any excessive daytime sleepiness at this time.  EPWORTH SLEEPINESS SCALE  On a scale of 0 - 3 what is the chance of dozing:  Sitting and Reading:   0 Watching TV:    0 Sitting inactive in a public place: 0 Passenger in car for one hour: 0 Lying down to rest in the afternoon: 0 Sitting and talking to someone: 0 Sitting quietly after lunch:  0 In a car, stopped in traffic:  0  Total (out of 24):     0/24    Was 10/24 before CPAP   Update 09/15/17: He reports his wife notes he is more forgetful.    As examples he has forgotten appointments and does not remember some things his wife has told him.   He scored 24/30 on the MoCA.   However, when I retested the short term memory, he scored 3 out of 3 at 4 minutes and did not need any problems.  He notes his focus and attention are worse than a few years ago.   He also has sleepiness.  Sleepiness is better on CPAP but never resolved completely.   He is on clonazepam only about 4/week.   He no longer takes oxycodone (was only on after surgery).     He got a new CPAP and feels he did better.   He slept 7 1/2 hours straight last night      He has joined a gym and tries to exercise several times a week   Montreal Cognitive Assessment  09/15/2017  Visuospatial/ Executive (0/5) 4  Naming (0/3) 3  Attention: Read list of digits (0/2) 2  Attention: Read list of letters (0/1) 1  Attention: Serial 7 subtraction starting at 100 (0/3) 2  Language: Repeat phrase (0/2) 2  Language : Fluency (0/1) 1  Abstraction (0/2) 2  Delayed Recall (0/5) 0  Orientation (0/6) 6  Total 23  Adjusted Score (based on education) 24     From 08/30/2017: He was diagnosed with OSA in 2012 after presenting with snoring and EDS.   His first one was done at [facility] and he recalls an AHI = 24.   He was titrated to CPAP +14 cm.   He has excellent compliance (100%) and efficacy (30  day AHI = 2.4 today).     He feels much better the next day when he uses CPAP.   He feels the machine helped him more initially than it does now and he still has some EDS.     He does not snore through the night.  On a typical knight, he goes to bed 1030 pm and falls asleep in 10-15 minutes.    He does not wake up at night.  In the morning he feels fairly refreshed.     EPWORTH SLEEPINESS SCALE  On a scale of 0 - 3 what is the chance of dozing:  Sitting and Reading:   1 Watching TV:    1 Sitting inactive in a public place: 2 Passenger in car for one hour: 0 Lying down to rest in the afternoon: 3 Sitting and talking to someone: 0 Sitting quietly after lunch:  3 In a car, stopped in traffic:  0  Total (out of 24):    10/24  (mild excessive daytime sleepiness).     Health is fairly good.   BP is good.  He is on a beta blockers for occasional palpitations.   He has NIDDM.    He has had obesity x years.     He has ED most of the time.    Viagra has not helped.    Surgery was discussed with him by urology.          REVIEW OF SYSTEMS: Constitutional: No fevers, chills, sweats, or change in appetite.   Mild insomnia/RLS better on CPAP Eyes: No visual changes, double vision, eye pain Ear, nose and throat: No hearing loss, ear pain, nasal congestion, sore throat Cardiovascular: No chest pain, palpitations Respiratory: No shortness of breath at rest or with exertion.   OSA and occ coughing GastrointestinaI: No nausea, vomiting, diarrhea, abdominal pain, fecal incontinence Genitourinary: No dysuria, urinary retention or frequency.  No nocturia. Musculoskeletal: has some LBP and myalgias Integumentary: No rash, pruritus, skin lesions Neurological: as above Psychiatric: No depression at this time.  No anxiety Endocrine: No palpitations, diaphoresis, change in appetite, change in weigh or increased thirst Hematologic/Lymphatic: No anemia, purpura, petechiae. Allergic/Immunologic: No  itchy/runny eyes, nasal congestion, recent allergic reactions, rashes  ALLERGIES: Allergies  Allergen Reactions  . Naproxen Sodium Hives, Shortness Of Breath and Swelling  . Bee Venom Itching    HOME MEDICATIONS:  Current Outpatient Medications:  .  buPROPion (WELLBUTRIN XL) 150 MG 24 hr tablet, Take 150 mg by mouth daily., Disp: , Rfl:  .  clonazePAM (KLONOPIN) 1 MG tablet, Take 1 mg by mouth 3 (three) times daily as needed (for anxiety). , Disp: , Rfl:  .  fenofibrate 160 MG tablet, Take 160 mg by mouth daily with breakfast. , Disp: , Rfl: 3 .  glipiZIDE (GLUCOTROL XL) 5 MG 24 hr tablet, Take 10 mg by mouth daily with breakfast. , Disp: , Rfl:  .  meclizine (ANTIVERT) 12.5 MG tablet, Take 12.5 mg by mouth 2 (two) times daily as needed for dizziness. , Disp: , Rfl:  .  metFORMIN (GLUCOPHAGE-XR) 500 MG 24 hr tablet, Take 2 tablets (1,000 mg total) by mouth 2 (two) times daily., Disp: , Rfl: 3 .  metoprolol tartrate (LOPRESSOR) 25 MG tablet, Take 25 mg by mouth See admin instructions. Take 1 tablet (25 mg) by mouth twice daily. May take an additional 12.5 mg as needed for palpitations., Disp: , Rfl:  .  Omega-3 Fatty Acids (FISH OIL) 1200 MG CAPS, Take 2,400 mg by mouth daily., Disp: , Rfl:  .  omeprazole (PRILOSEC) 40 MG capsule, Take 40 mg by mouth daily before breakfast. , Disp: , Rfl:  .  pravastatin (PRAVACHOL) 40 MG tablet, Take 40 mg by mouth daily., Disp: , Rfl: 3 .  rOPINIRole (REQUIP) 1 MG tablet, Take 1 mg by mouth 3 (three) times daily. , Disp: , Rfl:  .  sertraline (ZOLOFT) 100 MG tablet, Take 150 mg by mouth daily. , Disp: , Rfl:  .  testosterone cypionate (DEPOTESTOSTERONE CYPIONATE) 200 MG/ML  injection, Inject 1 mL into the muscle every 21 ( twenty-one) days. , Disp: , Rfl:  .  traMADol (ULTRAM) 50 MG tablet, Take 50 mg by mouth 4 (four) times daily as needed (pain). , Disp: , Rfl:  .  TURMERIC PO, Take 1,000 mg by mouth daily., Disp: , Rfl:   PAST MEDICAL HISTORY: Past  Medical History:  Diagnosis Date  . Anxiety   . Diabetes mellitus without complication (HCC)    Type 2  . Dysrhythmia    palpitations  . GERD (gastroesophageal reflux disease)   . High cholesterol   . History of kidney stones   . Hypothyroidism    hx of correcte no meds now  . Sleep apnea    cpap    PAST SURGICAL HISTORY: Past Surgical History:  Procedure Laterality Date  . COLONOSCOPY    . HERNIA REPAIR     Left inguinal, umbilical with mesh [id number] Dr. Redmond Pulling  . LEFT HEART CATH AND CORONARY ANGIOGRAPHY N/A 09/12/2018   Procedure: LEFT HEART CATH AND CORONARY ANGIOGRAPHY;  Surgeon: Martinique, Peter M, MD;  Location: [facility];  Service: Cardiovascular;  Laterality: N/A;  . UMBILICAL HERNIA REPAIR N/A 06/23/2017   Procedure: LAPAROSCOPIC ASSISTED UMBILICAL HERNIA REPAIR WITH MESH;  Surgeon: Greer Pickerel, MD;  Location: [facility];  Service: General;  Laterality: N/A;    FAMILY HISTORY: Family History  Adopted: Yes  Problem Relation Age of Onset  . Heart failure Mother   . Breast cancer Mother   . Diabetes Mother   . Lung cancer Father     SOCIAL HISTORY:  Social History   Socioeconomic History  . Marital status: Married    Spouse name: Not on file  . Number of children: Not on file  . Years of education: Not on file  . Highest education level: Not on file  Occupational History  . Not on file  Social Needs  . Financial resource strain: Not on file  . Food insecurity:    Worry: Not on file    Inability: Not on file  . Transportation needs:    Medical: Not on file    Non-medical: Not on file  Tobacco Use  . Smoking status: Former Smoker    Last attempt to quit: 06/21/2010    Years since quitting: 8.2  . Smokeless tobacco: Former Systems developer    Quit date: 06/13/2010  Substance and Sexual Activity  . Alcohol use: No    Comment: hx of none in 7-8 years  . Drug use: No  . Sexual activity: Never  Lifestyle  . Physical activity:    Days per week: Not on file     Minutes per session: Not on file  . Stress: Not on file  Relationships  . Social connections:    Talks on phone: Not on file    Gets together: Not on file    Attends religious service: Not on file    Active member of club or organization: Not on file    Attends meetings of clubs or organizations: Not on file    Relationship status: Not on file  . Intimate partner violence:    Fear of current or ex partner: Not on file    Emotionally abused: Not on file    Physically abused: Not on file    Forced sexual activity: Not on file  Other Topics Concern  . Not on file  Social History Narrative  . Not on file     PHYSICAL  EXAM  There were no vitals filed for this visit.  There is no height or weight on file to calculate BMI.   General: The patient is an obese man in no acute distress   Neurologic Exam  Mental status: The patient is alert and oriented x 3 at the time of the examination.  Speech is normal.  He appears to have normal short-term memory and focus and attention.  Cranial nerves: Extraocular movements are full.  Facial strength and sensation was normal.  The trapezius strength is normal.  The tongue is midline, and the patient has symmetric elevation of the soft palate. No obvious hearing deficits are noted.  Motor:  Muscle bulk is normal.   Tone is normal. Strength is  5 / 5 in all 4 extremities.    Coordination: Cerebellar testing reveals good finger-nose-finger  Gait and station: Station is normal.  Gait and tandem walk are normal.  Reflexes: Deep tendon reflexes are symmetric and normal bilaterally.       DIAGNOSTIC DATA (LABS, IMAGING, TESTING) - I reviewed patient records, labs, notes, testing and imaging myself where available.  Lab Results  Component Value Date   WBC 7.0 09/12/2018   HGB 14.6 09/12/2018   HCT 45.9 09/12/2018   MCV 94.6 09/12/2018   PLT 195 09/12/2018      Component Value Date/Time   NA 137 09/12/2018 0604   K 4.2 09/12/2018 0604    CL 106 09/12/2018 0604   CO2 22 09/12/2018 0604   GLUCOSE 198 (H) 09/12/2018 0604   BUN 12 09/12/2018 0604   CREATININE 1.17 09/12/2018 0604   CALCIUM 9.0 09/12/2018 0604   PROT 7.3 08/22/2012 2023   ALBUMIN 3.9 08/22/2012 2023   AST 23 08/22/2012 2023   ALT 42 08/22/2012 2023   ALKPHOS 68 08/22/2012 2023   BILITOT 0.3 08/22/2012 2023   GFRNONAA >60 09/12/2018 0604   GFRAA >60 09/12/2018 0604   Lab Results  Component Value Date   CHOL 174 01/02/2010   HDL 30.80 (L) 01/02/2010   LDLDIRECT 95.1 01/02/2010   TRIG 298.0 (H) 01/02/2010   CHOLHDL 6 01/02/2010   Lab Results  Component Value Date   HGBA1C 6.0 (H) 06/21/2017   Lab Results  Component Value Date   VITAMINB12 473 09/15/2017   Lab Results  Component Value Date   TSH 3.660 09/15/2017       Milayna Rotenberg A. Felecia Shelling, MD, PhD, FAAN Certified in Neurology, Clinical Neurophysiology, Sleep Medicine, Pain Medicine and Neuroimaging Director, [facility] [address] [place], [place] [address] [phone]

## 2018-09-20 DIAGNOSIS — L820 Inflamed seborrheic keratosis: Secondary | ICD-10-CM | POA: Diagnosis not present

## 2018-09-20 DIAGNOSIS — D224 Melanocytic nevi of scalp and neck: Secondary | ICD-10-CM | POA: Diagnosis not present

## 2018-09-20 DIAGNOSIS — D485 Neoplasm of uncertain behavior of skin: Secondary | ICD-10-CM | POA: Diagnosis not present

## 2018-09-21 DIAGNOSIS — G4733 Obstructive sleep apnea (adult) (pediatric): Secondary | ICD-10-CM | POA: Diagnosis not present

## 2018-09-25 ENCOUNTER — Telehealth: Payer: Self-pay | Admitting: Neurology

## 2018-09-25 DIAGNOSIS — E291 Testicular hypofunction: Secondary | ICD-10-CM | POA: Diagnosis not present

## 2018-09-25 DIAGNOSIS — Z6841 Body Mass Index (BMI) 40.0 and over, adult: Secondary | ICD-10-CM | POA: Diagnosis not present

## 2018-09-25 DIAGNOSIS — E1165 Type 2 diabetes mellitus with hyperglycemia: Secondary | ICD-10-CM | POA: Diagnosis not present

## 2018-09-25 DIAGNOSIS — Z7984 Long term (current) use of oral hypoglycemic drugs: Secondary | ICD-10-CM | POA: Diagnosis not present

## 2018-09-25 NOTE — Telephone Encounter (Signed)
09/25/18 LVM to schedule 1 yr f/u w/ Dr. Felecia Shelling or Debbora Presto, NP

## 2018-10-03 ENCOUNTER — Telehealth: Payer: Self-pay | Admitting: *Deleted

## 2018-10-03 NOTE — Telephone Encounter (Signed)
Per Dr Sallyanne Kuster from staff message; Follow up in August- September, after we open up  [facility]   I attempted to contact patient to let him know about follow up,no answer, no voice mail.

## 2018-10-03 NOTE — Telephone Encounter (Signed)
Pt is aware will plan to contact him about an appointment with Dr Sallyanne Kuster in August/Sept. Pt states he is doing well, thanked me for call.

## 2018-10-09 DIAGNOSIS — E291 Testicular hypofunction: Secondary | ICD-10-CM | POA: Diagnosis not present

## 2018-10-13 DIAGNOSIS — R69 Illness, unspecified: Secondary | ICD-10-CM | POA: Diagnosis not present

## 2018-10-13 DIAGNOSIS — F410 Panic disorder [episodic paroxysmal anxiety] without agoraphobia: Secondary | ICD-10-CM | POA: Diagnosis not present

## 2018-10-13 DIAGNOSIS — F429 Obsessive-compulsive disorder, unspecified: Secondary | ICD-10-CM | POA: Diagnosis not present

## 2018-10-25 DIAGNOSIS — R05 Cough: Secondary | ICD-10-CM | POA: Diagnosis not present

## 2018-10-30 ENCOUNTER — Telehealth: Payer: Self-pay | Admitting: Cardiovascular Disease

## 2018-10-30 DIAGNOSIS — R002 Palpitations: Secondary | ICD-10-CM

## 2018-10-30 DIAGNOSIS — R05 Cough: Secondary | ICD-10-CM | POA: Diagnosis not present

## 2018-10-30 DIAGNOSIS — J189 Pneumonia, unspecified organism: Secondary | ICD-10-CM | POA: Diagnosis not present

## 2018-10-30 NOTE — Telephone Encounter (Signed)
Spoke with pt who state that he has been having palpations and feel real tired. He was seen by his pcp today who state his has Pneumonia and instructed him to follow up Cardiologist. Pt state when he have the palpations he feels light headed and they have become more frequent. Informed pt that currently our office is closed and would encourage him to report to ED for evaluations. Pt state he does feel that he need to be seen in ED as his BP was WNL, HR 96 and having no chest pain. Pt state he would like for me to send a message to Dr. Loletha Grayer for his recommendation and will got to ED if symptoms worsen.   Message routed to MD

## 2018-10-30 NOTE — Telephone Encounter (Signed)
Patient c/o Palpitations:  High priority if patient c/o lightheadedness, shortness of breath, or chest pain  1) How long have you had palpitations/irregular HR/ Afib? Are you having the symptoms now? About 2 weeks-every day  2) Are you currently experiencing lightheadedness, SOB or CP?dizzy lightheaded and SOB  3) Do you have a history of afib (atrial fibrillation) or irregular heart rhythm? no  4) Have you checked your BP or HR? (document readings if available): 107/73 today and heart rate was 96 today  5) Are you experiencing any other symptoms?He feels a sensation in his head when he has the palpitations- can not hardly explain

## 2018-10-31 DIAGNOSIS — R6889 Other general symptoms and signs: Secondary | ICD-10-CM | POA: Diagnosis not present

## 2018-11-01 NOTE — Telephone Encounter (Signed)
Pt called back in regards to his pneumonia and palpitations. He states they are still happening, and he wants to know if Dr. Loletha Grayer wants to put him on another medication.  He did have a COVID test when he went to his PCP

## 2018-11-01 NOTE — Telephone Encounter (Signed)
Called Stephen Franklin. He has a lot of chest congestion and questionable pneumonia. Covid -ve x 1 , but waiting for repeat test. Metoprolol, even doubled up is not helping and he is having a lot of palpitations. Stop metoprolol and try bisoprolol 5 mg daily #30, 3 RF . Please call to [facility] on [address]. Send him a 3 day zio-patch please MCr

## 2018-11-02 ENCOUNTER — Telehealth: Payer: Self-pay | Admitting: Radiology

## 2018-11-02 MED ORDER — BISOPROLOL FUMARATE 5 MG PO TABS: 5.0000 mg | ORAL_TABLET | Freq: Every day | ORAL | 3 refills | Status: DC

## 2018-11-02 NOTE — Telephone Encounter (Signed)
New orders placed. Pt made aware of recommendations and voiced understanding. Message sent to scheduling for ordering monitor.

## 2018-11-02 NOTE — Telephone Encounter (Signed)
Enrolled patient for a 3 day Zio monitor to be mailed. Brief instructions were gone over with patient and he knows to expect the monitor to arrive in 3-4 days

## 2018-11-07 ENCOUNTER — Telehealth: Payer: Self-pay | Admitting: Cardiovascular Disease

## 2018-11-07 NOTE — Telephone Encounter (Signed)
LMTCB

## 2018-11-07 NOTE — Telephone Encounter (Signed)
Pt wants to know if it is safe for him to get his Testosterone injections from his PCP, [facility]. The PCP wants to make sure that the injections are safe because the patient is on a beta-blocker. He Has not had an injection in a few days, and is starting to feel the effects of not having one, and would like to get one ASAP.cv

## 2018-11-07 NOTE — Telephone Encounter (Signed)
Follow up  ° ° °Pt is returning call  ° ° °Please call back  °

## 2018-11-08 DIAGNOSIS — R69 Illness, unspecified: Secondary | ICD-10-CM | POA: Diagnosis not present

## 2018-11-08 NOTE — Telephone Encounter (Signed)
Spoke with pt, per review of patients chart by the pharmacist okay given for injections with his beta blocker. She reports the combination of the glipizide and testosterone can cause hypoglycemia but there is no problem with the beta blocker. This telephone note will be faxed to Dr Aura Dials @ [phone] per patient request.

## 2018-11-09 ENCOUNTER — Ambulatory Visit (INDEPENDENT_AMBULATORY_CARE_PROVIDER_SITE_OTHER): Payer: Medicare HMO

## 2018-11-09 DIAGNOSIS — R002 Palpitations: Secondary | ICD-10-CM | POA: Diagnosis not present

## 2018-11-09 DIAGNOSIS — E291 Testicular hypofunction: Secondary | ICD-10-CM | POA: Diagnosis not present

## 2018-11-09 DIAGNOSIS — G5603 Carpal tunnel syndrome, bilateral upper limbs: Secondary | ICD-10-CM | POA: Diagnosis not present

## 2018-11-10 ENCOUNTER — Telehealth: Payer: Self-pay

## 2018-11-10 NOTE — Telephone Encounter (Signed)
    [facility] Pre-operative Risk Assessment    Request for surgical clearance:  1. What type of surgery is being performed? Left Carpal Tunnel Release  2. When is this surgery scheduled? 12/04/18  3. What type of clearance is required (medical clearance vs. Pharmacy clearance to hold med vs. Both)? Medical  4. Are there any medications that need to be held prior to surgery and how long? None  5. Practice name and name of physician performing surgery?  [facility]    Dr.Matthew Demorest  6. What is your office phone number [phone]    7.   What is your office fax number [phone]  8.   Anesthesia type  Local  Kathyrn Lass 11/10/2018, 6:25 PM  _________________________________________________________________   (provider comments below)

## 2018-11-13 NOTE — Telephone Encounter (Signed)
   Primary Cardiologist: Sanda Klein, MD  Chart revisited as part of pre-operative protocol coverage. Patient reports no new CP or SOB. He has a monitor in progress for episodic palpitations. Today he has been working in the yard and feels good. Per Dr. Sallyanne Kuster, "Stephen Franklin is at low risk for major cardiovascular complications with the planned surgery."   I will route this recommendation to the requesting party via [place] fax function and remove from pre-op pool.  Please call with questions and let us know if anything should change prior to surgery.  Charlie Pitter, PA-C 11/13/2018, 1:24 PM

## 2018-11-13 NOTE — Telephone Encounter (Signed)
Mr. Stephen Franklin is at low risk for major cardiovascular complications with the planned surgery.

## 2018-11-13 NOTE — Telephone Encounter (Signed)
   Primary Cardiologist: Sanda Klein, MD  Chart reviewed as part of pre-operative protocol coverage. Patient was contacted 11/13/2018 in reference to pre-operative risk assessment for pending surgery as outlined below.  Stephen Franklin was last seen on 09/08/18 by Dr. Sallyanne Kuster - hx of anxiety, DM, palpitaitons, GERD, HLD, kidney stones, hypothyroidism, OSA, morbid obesity. Underwent cath 09/12/18 with 45% D1, normal LVEF and normal LVEDP. RCR 0.4% indicating very low risk of CV complications. However, patient recently called in with palpitations in setting of possible PNA and is pending 3 day Zio monitor. Will route to Dr. Sallyanne Kuster to find out if he comfortable clearing the patient without having these results back yet.  Dr. Sallyanne Kuster - - Please route response to [facility] PREOP (the pre-op pool). Thank you.  Charlie Pitter, PA-C 11/13/2018, 11:16 AM

## 2018-11-16 ENCOUNTER — Telehealth: Payer: Self-pay | Admitting: Cardiology

## 2018-11-16 ENCOUNTER — Telehealth: Payer: Self-pay | Admitting: Cardiovascular Disease

## 2018-11-16 NOTE — Telephone Encounter (Signed)
Patient called and asked if Dr. Loletha Grayer had the results from his 3 day heart monitor yet.   Please call with results

## 2018-11-16 NOTE — Telephone Encounter (Signed)
Spoke with pt and informed that results of 3-day Zio not yet available, but that he would be contacted with results when they are. Pt verbalized understanding

## 2018-11-16 NOTE — Telephone Encounter (Signed)
Pt called answering service with complaints of increased palpitations. I returned call. He was very pleasant. He is concerned that his palpitations are worse and they are annoying. Not fast. Feels like skipping beats with pauses. Rate is 72 and BP 120/80. No chest pain, shortness of breath, lightheadedness or syncope. His head feels a little full. He completed wearing a ZIO heart monitor and awaiting results. His description seems consistent with PVCs but could be brief SVT or NSVT. He denies caffeine use, decongestants or herbal supplements. He was working out in the yard in 90 degree heat today. He says that he was drinking fluids and does not think he is dehydrated. He does admit to having a lot of stress.  He says that palpitations seemed to be associated with starting Trulicity about 2 months ago. He just had a weekly dose yesterday.  I am not sure if this is a side effect- UpToDate lists tachycardia. I reassured him that he sounds very stable. We will have more information once monitor results. I did tell him that it would be safe to take an extra bisoprolol tonight.  I will route this to Dr. Loletha Grayer for review.

## 2018-11-17 NOTE — Telephone Encounter (Signed)
Got it. Will wait for the monitor tracings before making any additional recommendations

## 2018-11-18 DIAGNOSIS — R002 Palpitations: Secondary | ICD-10-CM | POA: Diagnosis not present

## 2018-11-21 ENCOUNTER — Other Ambulatory Visit: Payer: Self-pay

## 2018-11-21 ENCOUNTER — Other Ambulatory Visit: Payer: Self-pay | Admitting: Cardiovascular Disease

## 2018-11-21 ENCOUNTER — Telehealth: Payer: Self-pay | Admitting: Cardiovascular Disease

## 2018-11-21 MED ORDER — BISOPROLOL FUMARATE 5 MG PO TABS: 5.0000 mg | ORAL_TABLET | Freq: Two times a day (BID) | ORAL | 2 refills | Status: DC

## 2018-11-21 MED ORDER — BISOPROLOL FUMARATE 5 MG PO TABS: 5.0000 mg | ORAL_TABLET | Freq: Every day | ORAL | 2 refills | Status: DC

## 2018-11-21 NOTE — Telephone Encounter (Signed)
Okay to go ahead and send in new med?  Patient states he is about to run out-since using it more often. Or wait until results?

## 2018-11-21 NOTE — Telephone Encounter (Signed)
Okay, submitted into pharmacy as 5 mg BID- if we get push back we will do 10 mg once daily..  Thank you!

## 2018-11-21 NOTE — Telephone Encounter (Signed)
°*  STAT* If patient is at the pharmacy, call can be transferred to refill team.   1. Which medications need to be refilled? (please list name of each medication and dose if known) bisoprolol (ZEBETA) 5 MG tablet  2. Which pharmacy/location (including street and city if local pharmacy) is medication to be sent to? [facility] - [place], [place] - [address]  3. Do they need a 30 day or 90 day supply? 90 days  It was recently increased to two a days. It is working for his palpations, having no issue with low BP or low HR patient states.

## 2018-11-21 NOTE — Telephone Encounter (Signed)
Rx(s) sent to pharmacy electronically.  

## 2018-11-21 NOTE — Telephone Encounter (Signed)
Just got the report. OK to take bisoprolol 5 mg BID, but insurance may not cover it as a BID drug. Please Rx bisoprolol 10 mg daily, #90, RF 3 (he can choose to take a half tab twice daily if he finds that works better, but it is a long acting drug and 10 mg once daily should be effective and more convenient). Sorry for the delay in getting the monitor report.

## 2018-11-21 NOTE — Telephone Encounter (Signed)
Called to notify patient that his results were not seen in Epic yet- but would route a message to Dr.C to make sure he had not received any news as well.   Patient also states he increase the bisoprolol to 2 tablets (one in the morning and one in the evening) and it has helped him a lot with palpitations- he is needing a new RX to take 2 tablets and not one- he is requested we send this in. I advised I would have to get clarification if okay to do so.   Patient also wanted to make Dr.C aware that he is having hand surgery on Friday.

## 2018-11-21 NOTE — Telephone Encounter (Signed)
Patient is calling for results of his holter monitor.

## 2018-11-21 NOTE — Telephone Encounter (Signed)
Haven't received the tracings yet MCr

## 2018-11-24 DIAGNOSIS — G5602 Carpal tunnel syndrome, left upper limb: Secondary | ICD-10-CM | POA: Diagnosis not present

## 2018-11-30 ENCOUNTER — Other Ambulatory Visit: Payer: Self-pay

## 2018-11-30 ENCOUNTER — Emergency Department (HOSPITAL_BASED_OUTPATIENT_CLINIC_OR_DEPARTMENT_OTHER)
Admission: EM | Admit: 2018-11-30 | Discharge: 2018-11-30 | Disposition: A | Discharge status: Home / Self Care | Payer: Medicare HMO | Attending: Emergency Medicine | Admitting: Emergency Medicine

## 2018-11-30 ENCOUNTER — Emergency Department (HOSPITAL_BASED_OUTPATIENT_CLINIC_OR_DEPARTMENT_OTHER): Payer: Medicare HMO

## 2018-11-30 ENCOUNTER — Encounter (HOSPITAL_BASED_OUTPATIENT_CLINIC_OR_DEPARTMENT_OTHER): Payer: Self-pay

## 2018-11-30 DIAGNOSIS — Y999 Unspecified external cause status: Secondary | ICD-10-CM | POA: Diagnosis not present

## 2018-11-30 DIAGNOSIS — R52 Pain, unspecified: Secondary | ICD-10-CM | POA: Diagnosis not present

## 2018-11-30 DIAGNOSIS — Z79899 Other long term (current) drug therapy: Secondary | ICD-10-CM | POA: Diagnosis not present

## 2018-11-30 DIAGNOSIS — S199XXA Unspecified injury of neck, initial encounter: Secondary | ICD-10-CM | POA: Diagnosis not present

## 2018-11-30 DIAGNOSIS — E039 Hypothyroidism, unspecified: Secondary | ICD-10-CM | POA: Insufficient documentation

## 2018-11-30 DIAGNOSIS — M542 Cervicalgia: Secondary | ICD-10-CM | POA: Diagnosis not present

## 2018-11-30 DIAGNOSIS — S161XXA Strain of muscle, fascia and tendon at neck level, initial encounter: Secondary | ICD-10-CM | POA: Diagnosis not present

## 2018-11-30 DIAGNOSIS — Z7984 Long term (current) use of oral hypoglycemic drugs: Secondary | ICD-10-CM | POA: Diagnosis not present

## 2018-11-30 DIAGNOSIS — Z87891 Personal history of nicotine dependence: Secondary | ICD-10-CM | POA: Diagnosis not present

## 2018-11-30 DIAGNOSIS — E119 Type 2 diabetes mellitus without complications: Secondary | ICD-10-CM | POA: Insufficient documentation

## 2018-11-30 DIAGNOSIS — Y92410 Unspecified street and highway as the place of occurrence of the external cause: Secondary | ICD-10-CM | POA: Insufficient documentation

## 2018-11-30 DIAGNOSIS — Y9389 Activity, other specified: Secondary | ICD-10-CM | POA: Diagnosis not present

## 2018-11-30 DIAGNOSIS — M545 Low back pain: Secondary | ICD-10-CM | POA: Insufficient documentation

## 2018-11-30 DIAGNOSIS — S3992XA Unspecified injury of lower back, initial encounter: Secondary | ICD-10-CM | POA: Diagnosis not present

## 2018-11-30 MED ORDER — ACETAMINOPHEN 500 MG PO TABS
1000.0000 mg | ORAL_TABLET | Freq: Once | ORAL | Status: AC
  Administered 2018-11-30: 1000 mg via ORAL
  Filled 2018-11-30: qty 2

## 2018-11-30 MED ORDER — METHOCARBAMOL 500 MG PO TABS: 500.0000 mg | ORAL_TABLET | Freq: Two times a day (BID) | ORAL | 0 refills | Status: DC | PRN

## 2018-11-30 MED ORDER — METHOCARBAMOL 500 MG PO TABS
500.0000 mg | ORAL_TABLET | Freq: Once | ORAL | Status: AC
  Administered 2018-11-30: 500 mg via ORAL
  Filled 2018-11-30: qty 1

## 2018-11-30 NOTE — ED Notes (Signed)
Pt. Talks non stop and is alert and oriented
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
## 2018-12-12 DIAGNOSIS — G4733 Obstructive sleep apnea (adult) (pediatric): Secondary | ICD-10-CM | POA: Diagnosis not present

## 2018-12-21 DIAGNOSIS — M79604 Pain in right leg: Secondary | ICD-10-CM | POA: Diagnosis not present

## 2018-12-21 DIAGNOSIS — M79605 Pain in left leg: Secondary | ICD-10-CM | POA: Diagnosis not present

## 2018-12-26 DIAGNOSIS — E291 Testicular hypofunction: Secondary | ICD-10-CM | POA: Diagnosis not present

## 2019-01-15 DIAGNOSIS — E291 Testicular hypofunction: Secondary | ICD-10-CM | POA: Diagnosis not present

## 2019-01-15 DIAGNOSIS — E669 Obesity, unspecified: Secondary | ICD-10-CM | POA: Diagnosis not present

## 2019-01-15 DIAGNOSIS — E1165 Type 2 diabetes mellitus with hyperglycemia: Secondary | ICD-10-CM | POA: Diagnosis not present

## 2019-01-15 DIAGNOSIS — Z6841 Body Mass Index (BMI) 40.0 and over, adult: Secondary | ICD-10-CM | POA: Diagnosis not present

## 2019-01-18 DIAGNOSIS — E291 Testicular hypofunction: Secondary | ICD-10-CM | POA: Diagnosis not present

## 2019-01-23 DIAGNOSIS — G8929 Other chronic pain: Secondary | ICD-10-CM | POA: Diagnosis not present

## 2019-01-23 DIAGNOSIS — M545 Low back pain: Secondary | ICD-10-CM | POA: Diagnosis not present

## 2019-01-23 DIAGNOSIS — Z79899 Other long term (current) drug therapy: Secondary | ICD-10-CM | POA: Diagnosis not present

## 2019-01-24 DIAGNOSIS — L821 Other seborrheic keratosis: Secondary | ICD-10-CM | POA: Diagnosis not present

## 2019-01-24 DIAGNOSIS — L918 Other hypertrophic disorders of the skin: Secondary | ICD-10-CM | POA: Diagnosis not present

## 2019-01-24 DIAGNOSIS — D225 Melanocytic nevi of trunk: Secondary | ICD-10-CM | POA: Diagnosis not present

## 2019-01-24 DIAGNOSIS — L570 Actinic keratosis: Secondary | ICD-10-CM | POA: Diagnosis not present

## 2019-01-31 DIAGNOSIS — M5441 Lumbago with sciatica, right side: Secondary | ICD-10-CM | POA: Diagnosis not present

## 2019-01-31 DIAGNOSIS — G8929 Other chronic pain: Secondary | ICD-10-CM | POA: Diagnosis not present

## 2019-02-02 DIAGNOSIS — R69 Illness, unspecified: Secondary | ICD-10-CM | POA: Diagnosis not present

## 2019-02-05 DIAGNOSIS — E291 Testicular hypofunction: Secondary | ICD-10-CM | POA: Diagnosis not present

## 2019-02-28 DIAGNOSIS — R69 Illness, unspecified: Secondary | ICD-10-CM | POA: Diagnosis not present

## 2019-02-28 DIAGNOSIS — E291 Testicular hypofunction: Secondary | ICD-10-CM | POA: Diagnosis not present

## 2019-03-01 DIAGNOSIS — R69 Illness, unspecified: Secondary | ICD-10-CM | POA: Diagnosis not present

## 2019-03-06 ENCOUNTER — Telehealth: Payer: Self-pay | Admitting: Cardiovascular Disease

## 2019-03-06 MED ORDER — BISOPROLOL FUMARATE 5 MG PO TABS: ORAL_TABLET | ORAL | 1 refills | Status: DC

## 2019-03-06 NOTE — Telephone Encounter (Signed)
Called patient, LVM advising of change in medication, sent to pharmacy with changes.  Advised patient to call back if any questions or concerns. Left call back number.

## 2019-03-06 NOTE — Telephone Encounter (Signed)
New Message   STAT if HR is under 50 or over 120 (normal HR is 60-100 beats per minute)  1) What is your heart rate? 90  2) Do you have a log of your heart rate readings (document readings)? 90, 94  3) Do you have any other symptoms? No   Patient states that he takes bisoprolol (ZEBETA) 5 MG tablet and he has been having frequest palpitations. He is not sure should he up his medication. Please call.  His BP today is 133/78

## 2019-03-06 NOTE — Telephone Encounter (Signed)
Please increase bisoprolol to 10 mg in AM and 5 mg in PM. Will need new Rx

## 2019-03-06 NOTE — Telephone Encounter (Signed)
Called patient- he states that he seen Dr.Croitoru for his palpitations, he states he has no blockages- but he continues to have the palpitations and having more now than before- he is wondering if he should increase the medication or be placed on something else. Patient does mention having some added stress and that may cause him to feel them more- he states its like a butterfly is flying around in his chest-   Patient denies chest pain/SOB/swelling no other cardiac symptoms.  BP 133/78 HR 90's

## 2019-03-16 DIAGNOSIS — Z7984 Long term (current) use of oral hypoglycemic drugs: Secondary | ICD-10-CM | POA: Diagnosis not present

## 2019-03-16 DIAGNOSIS — E291 Testicular hypofunction: Secondary | ICD-10-CM | POA: Diagnosis not present

## 2019-03-16 DIAGNOSIS — E1165 Type 2 diabetes mellitus with hyperglycemia: Secondary | ICD-10-CM | POA: Diagnosis not present

## 2019-03-16 DIAGNOSIS — E669 Obesity, unspecified: Secondary | ICD-10-CM | POA: Diagnosis not present

## 2019-03-20 DIAGNOSIS — E291 Testicular hypofunction: Secondary | ICD-10-CM | POA: Diagnosis not present

## 2019-03-21 DIAGNOSIS — M5134 Other intervertebral disc degeneration, thoracic region: Secondary | ICD-10-CM | POA: Diagnosis not present

## 2019-03-21 DIAGNOSIS — I10 Essential (primary) hypertension: Secondary | ICD-10-CM | POA: Diagnosis not present

## 2019-03-21 DIAGNOSIS — K219 Gastro-esophageal reflux disease without esophagitis: Secondary | ICD-10-CM | POA: Diagnosis not present

## 2019-03-21 DIAGNOSIS — R69 Illness, unspecified: Secondary | ICD-10-CM | POA: Diagnosis not present

## 2019-03-21 DIAGNOSIS — E785 Hyperlipidemia, unspecified: Secondary | ICD-10-CM | POA: Diagnosis not present

## 2019-03-21 DIAGNOSIS — E291 Testicular hypofunction: Secondary | ICD-10-CM | POA: Diagnosis not present

## 2019-03-21 DIAGNOSIS — Z23 Encounter for immunization: Secondary | ICD-10-CM | POA: Diagnosis not present

## 2019-03-21 DIAGNOSIS — E119 Type 2 diabetes mellitus without complications: Secondary | ICD-10-CM | POA: Diagnosis not present

## 2019-03-21 DIAGNOSIS — G4733 Obstructive sleep apnea (adult) (pediatric): Secondary | ICD-10-CM | POA: Diagnosis not present

## 2019-03-22 DIAGNOSIS — G4733 Obstructive sleep apnea (adult) (pediatric): Secondary | ICD-10-CM | POA: Diagnosis not present

## 2019-03-30 DIAGNOSIS — Z20828 Contact with and (suspected) exposure to other viral communicable diseases: Secondary | ICD-10-CM | POA: Diagnosis not present

## 2019-03-30 DIAGNOSIS — M545 Low back pain: Secondary | ICD-10-CM | POA: Diagnosis not present

## 2019-04-10 DIAGNOSIS — E291 Testicular hypofunction: Secondary | ICD-10-CM | POA: Diagnosis not present

## 2019-04-11 DIAGNOSIS — R69 Illness, unspecified: Secondary | ICD-10-CM | POA: Diagnosis not present

## 2019-04-11 DIAGNOSIS — F429 Obsessive-compulsive disorder, unspecified: Secondary | ICD-10-CM | POA: Diagnosis not present

## 2019-04-11 DIAGNOSIS — F410 Panic disorder [episodic paroxysmal anxiety] without agoraphobia: Secondary | ICD-10-CM | POA: Diagnosis not present

## 2019-04-20 DIAGNOSIS — E119 Type 2 diabetes mellitus without complications: Secondary | ICD-10-CM | POA: Diagnosis not present

## 2019-04-20 DIAGNOSIS — M65312 Trigger thumb, left thumb: Secondary | ICD-10-CM | POA: Diagnosis not present

## 2019-04-20 DIAGNOSIS — M1990 Unspecified osteoarthritis, unspecified site: Secondary | ICD-10-CM | POA: Diagnosis not present

## 2019-04-24 DIAGNOSIS — R69 Illness, unspecified: Secondary | ICD-10-CM | POA: Diagnosis not present

## 2019-04-25 ENCOUNTER — Encounter: Payer: Self-pay | Admitting: Cardiovascular Disease

## 2019-04-25 ENCOUNTER — Telehealth (INDEPENDENT_AMBULATORY_CARE_PROVIDER_SITE_OTHER): Payer: Medicare HMO | Admitting: Cardiovascular Disease

## 2019-04-25 DIAGNOSIS — I10 Essential (primary) hypertension: Secondary | ICD-10-CM | POA: Diagnosis not present

## 2019-04-25 DIAGNOSIS — E781 Pure hyperglyceridemia: Secondary | ICD-10-CM | POA: Diagnosis not present

## 2019-04-25 DIAGNOSIS — G4733 Obstructive sleep apnea (adult) (pediatric): Secondary | ICD-10-CM

## 2019-04-25 DIAGNOSIS — E669 Obesity, unspecified: Secondary | ICD-10-CM | POA: Diagnosis not present

## 2019-04-25 DIAGNOSIS — I493 Ventricular premature depolarization: Secondary | ICD-10-CM

## 2019-04-25 DIAGNOSIS — E1169 Type 2 diabetes mellitus with other specified complication: Secondary | ICD-10-CM

## 2019-04-25 MED ORDER — BISOPROLOL FUMARATE 5 MG PO TABS: 5.0000 mg | ORAL_TABLET | Freq: Every day | ORAL | Status: DC

## 2019-04-25 NOTE — Progress Notes (Signed)
Virtual Visit via Video Note    Evaluation Performed:  Follow-up visit  This visit type was conducted due to national recommendations for restrictions regarding the COVID-19 Pandemic (e.g. social distancing).  This format is felt to be most appropriate for this patient at this time.  All issues noted in this document were discussed and addressed.  No physical exam was performed (except for noted visual exam findings with Video Visits).  Please refer to the patient's chart (MyChart message for video visits and phone note for telephone visits) for the patient's consent to telehealth for [facility].  Date:  04/25/2019   ID:  Stephen Franklin, DOB 1965-08-15, MRN [id number]  Patient Location:  [address] [place] [address]   Provider location:   [place], [place]  PCP:  Aura Dials, MD  Cardiologist:  Elektra Wartman Electrophysiologist:  None   Chief Complaint:  Precordial hest pain  History of Present Illness:    Stephen Franklin is a 53 y.o. male with morbid obesity and obstructive sleep apnea, type 2 diabetes mellitus, hyperlipidemia, hypertension, GERD and frequent PVCs who presents via audio/video conferencing for a telehealth visit today.    He underwent cardiac catheterization on 09/12/2018 for exertional chest discomfort which showed only minimal coronary lesions (45% stenosis in the ostium of the first diagonal branch of the LAD artery), normal left ventricular systolic function and normal filling pressures.  The patient specifically denies any chest pain at rest exertion, dyspnea at rest or with exertion, orthopnea, paroxysmal nocturnal dyspnea, syncope, palpitations, focal neurological deficits, intermittent claudication, lower extremity edema, unexplained weight gain, cough, hemoptysis or wheezing.  His palpitations are now well controlled on just 5 mg of bisoprolol daily.  He has occasional skipped beats that are just annoying, but otherwise asymptomatic.  He is  planning to undergo a [facility] health screening.  He reports losing 7 pounds since starting Trulicity about 5 months ago, but he weighs the same as he did in March 2020 according to my records.  The patient does not symptoms concerning for COVID-19 infection (fever, chills, cough, or new SHORTNESS OF BREATH).    Prior CV studies:   The following studies were reviewed today:  Cardiac catheterization March 2020, event monitor June 2020  Past Medical History:  Diagnosis Date  . Anxiety   . Diabetes mellitus without complication (HCC)    Type 2  . Dysrhythmia    palpitations  . GERD (gastroesophageal reflux disease)   . High cholesterol   . History of kidney stones   . Hypothyroidism    hx of correcte no meds now  . Sleep apnea    cpap   Past Surgical History:  Procedure Laterality Date  . COLONOSCOPY    . HERNIA REPAIR     Left inguinal, umbilical with mesh [id number] Dr. Redmond Pulling  . LEFT HEART CATH AND CORONARY ANGIOGRAPHY N/A 09/12/2018   Procedure: LEFT HEART CATH AND CORONARY ANGIOGRAPHY;  Surgeon: Martinique, Peter M, MD;  Location: [place] [facility];  Service: Cardiovascular;  Laterality: N/A;  . UMBILICAL HERNIA REPAIR N/A 06/23/2017   Procedure: LAPAROSCOPIC ASSISTED UMBILICAL HERNIA REPAIR WITH MESH;  Surgeon: Greer Pickerel, MD;  Location: [facility];  Service: General;  Laterality: N/A;     Current Meds  Medication Sig  . bisoprolol (ZEBETA) 5 MG tablet Take 10 mg in the morning, 5 mg in the evening. (Patient taking differently: Take 5 mg by mouth daily. )  . buPROPion (WELLBUTRIN XL) 150 MG 24 hr tablet  Take 150 mg by mouth daily.  . clonazePAM (KLONOPIN) 1 MG tablet Take 1 mg by mouth 3 (three) times daily as needed (for anxiety).   . Dulaglutide (TRULICITY) [id number] 0000000 SOPN Inject into the skin once a week.  . fenofibrate 160 MG tablet Take 160 mg by mouth daily with breakfast.   . glipiZIDE (GLUCOTROL XL) 5 MG 24 hr tablet Take 10 mg by mouth daily with breakfast.   .  metFORMIN (GLUCOPHAGE-XR) 500 MG 24 hr tablet Take 2 tablets (1,000 mg total) by mouth 2 (two) times daily.  . methocarbamol (ROBAXIN) 500 MG tablet Take 1 tablet (500 mg total) by mouth 2 (two) times daily as needed.  Marland Kitchen omeprazole (PRILOSEC) 40 MG capsule Take 40 mg by mouth daily before breakfast.   . pravastatin (PRAVACHOL) 40 MG tablet Take 40 mg by mouth daily.  Marland Kitchen rOPINIRole (REQUIP) 1 MG tablet Take 1 mg by mouth 3 (three) times daily.   . sertraline (ZOLOFT) 100 MG tablet Take 150 mg by mouth daily.   Marland Kitchen testosterone cypionate (DEPOTESTOSTERONE CYPIONATE) 200 MG/ML injection Inject 1 mL into the muscle every 21 ( twenty-one) days.   . traMADol (ULTRAM) 50 MG tablet Take 50 mg by mouth 4 (four) times daily as needed (pain).      Allergies:   Naproxen sodium and Bee venom   Social History   Tobacco Use  . Smoking status: Former Smoker    Quit date: 06/21/2010    Years since quitting: 8.8  . Smokeless tobacco: Former Systems developer    Quit date: 06/13/2010  Substance Use Topics  . Alcohol use: No    Comment: hx of none in 7-8 years  . Drug use: No     Family Hx: The patient's family history includes Breast cancer in his mother; Diabetes in his mother; Heart failure in his mother; Lung cancer in his father. He was adopted.  ROS:   Please see the history of present illness.     All other systems reviewed and are negative.   Labs/Other Tests and Data Reviewed:    Recent Labs: 09/12/2018: BUN 12; Creatinine, Ser 1.17; Hemoglobin 14.6; Platelets 195; Potassium 4.2; Sodium 137   Recent Lipid Panel Lab Results  Component Value Date/Time   CHOL 174 01/02/2010 12:37 PM   TRIG 298.0 (H) 01/02/2010 12:37 PM   HDL 30.80 (L) 01/02/2010 12:37 PM   CHOLHDL 6 01/02/2010 12:37 PM   LDLDIRECT 95.1 01/02/2010 12:37 PM    Labs in September 01, 2018  show hemoglobin A1c 7.6%, total cholesterol 135, HDL 26, LDL 38, triglycerides 355, creatinine 1.25, potassium 3.9, hemoglobin 14.1.  Labs March 21, 2019 showed hemoglobin A1c 7.6%, total cholesterol 125, HDL 26, LDL 45, triglycerides 270, creatinine 1.06, hemoglobin 14.6, potassium 4.4   Wt Readings from Last 3 Encounters:  04/25/19 (!) 310 lb (140.6 kg)  11/30/18 300 lb (136.1 kg)  09/12/18 (!) 313 lb (142 kg)     Exam:    Vital Signs:  Ht 5\' 11"  (1.803 m)   Wt (!) 310 lb (140.6 kg)   BMI 43.24 kg/m    Morbidly obese, well developed male in no acute distress.   ASSESSMENT & PLAN:    1.  CAD: Minor CAD by recent cardiac catheterization.  The focus is on risk factor modification. 2. HLP: LDL is well within target.  Triglycerides will improve with better glycemic control.  Needs to exercise and lose weight to see improvement in HDL. 3. DM: Hemoglobin  A1c remains above 7% and has not really changed, despite Trulicity.  I think he is a good candidate for SGLT2 inhibitor such as [place] or [place]. 4.  Obesity: Plans for bariatric surgery are on hold during the coronavirus pandemic. 5. PVCs: Adequately suppressed on a low-dose of beta-blocker.  There is room to increase this if necessary. 6. HTN: Well-controlled. 7. OSA: Reports 7% compliance with CPAP.  Denies daytime hypersomnolence.  This procedure has been fully reviewed with the patient and written informed consent has been obtained.   COVID-19 Education: The signs and symptoms of COVID-19 were discussed with the patient and how to seek care for testing (follow up with PCP or arrange E-visit).  The importance of social distancing was discussed today.  Patient Risk:   After full review of this patients clinical status, I feel that they are at least moderate risk at this time.  Time:   Today, I have spent 35 minutes with the patient with telehealth technology discussing CAD, unstable angina, cardiac catheterization and possible angioplasty/stent. This procedure has been fully reviewed with the patient and written informed consent has been obtained. .     Medication  Adjustments/Labs and Tests Ordered: Current medicines are reviewed at length with the patient today.  Concerns regarding medicines are outlined above.  Tests Ordered: No orders of the defined types were placed in this encounter.  Medication Changes: No orders of the defined types were placed in this encounter.   Disposition:  3 months or sooner, depending on cath results  Signed, Sanda Klein, MD  04/25/2019 8:22 AM    [facility]

## 2019-04-25 NOTE — Patient Instructions (Signed)

## 2019-04-26 DIAGNOSIS — L03031 Cellulitis of right toe: Secondary | ICD-10-CM | POA: Diagnosis not present

## 2019-04-27 DIAGNOSIS — M545 Low back pain: Secondary | ICD-10-CM | POA: Diagnosis not present

## 2019-04-27 DIAGNOSIS — M65312 Trigger thumb, left thumb: Secondary | ICD-10-CM | POA: Diagnosis not present

## 2019-04-27 DIAGNOSIS — M25561 Pain in right knee: Secondary | ICD-10-CM | POA: Diagnosis not present

## 2019-04-27 DIAGNOSIS — M1711 Unilateral primary osteoarthritis, right knee: Secondary | ICD-10-CM | POA: Diagnosis not present

## 2019-04-27 DIAGNOSIS — M79642 Pain in left hand: Secondary | ICD-10-CM | POA: Diagnosis not present

## 2019-05-01 DIAGNOSIS — Z09 Encounter for follow-up examination after completed treatment for conditions other than malignant neoplasm: Secondary | ICD-10-CM | POA: Diagnosis not present

## 2019-05-01 DIAGNOSIS — R69 Illness, unspecified: Secondary | ICD-10-CM | POA: Diagnosis not present

## 2019-05-01 DIAGNOSIS — L03031 Cellulitis of right toe: Secondary | ICD-10-CM | POA: Diagnosis not present

## 2019-05-01 DIAGNOSIS — E291 Testicular hypofunction: Secondary | ICD-10-CM | POA: Diagnosis not present

## 2019-05-18 DIAGNOSIS — Z20828 Contact with and (suspected) exposure to other viral communicable diseases: Secondary | ICD-10-CM | POA: Diagnosis not present

## 2019-05-20 DIAGNOSIS — E291 Testicular hypofunction: Secondary | ICD-10-CM | POA: Diagnosis not present

## 2019-05-31 DIAGNOSIS — L570 Actinic keratosis: Secondary | ICD-10-CM | POA: Diagnosis not present

## 2019-05-31 DIAGNOSIS — R69 Illness, unspecified: Secondary | ICD-10-CM | POA: Diagnosis not present

## 2019-05-31 DIAGNOSIS — D408 Neoplasm of uncertain behavior of other specified male genital organs: Secondary | ICD-10-CM | POA: Diagnosis not present

## 2019-05-31 DIAGNOSIS — L821 Other seborrheic keratosis: Secondary | ICD-10-CM | POA: Diagnosis not present

## 2019-06-11 DIAGNOSIS — G4733 Obstructive sleep apnea (adult) (pediatric): Secondary | ICD-10-CM | POA: Diagnosis not present

## 2019-06-11 DIAGNOSIS — E291 Testicular hypofunction: Secondary | ICD-10-CM | POA: Diagnosis not present

## 2019-06-13 DIAGNOSIS — Z20828 Contact with and (suspected) exposure to other viral communicable diseases: Secondary | ICD-10-CM | POA: Diagnosis not present

## 2019-06-26 DIAGNOSIS — E1165 Type 2 diabetes mellitus with hyperglycemia: Secondary | ICD-10-CM | POA: Diagnosis not present

## 2019-06-26 DIAGNOSIS — E291 Testicular hypofunction: Secondary | ICD-10-CM | POA: Diagnosis not present

## 2019-06-26 DIAGNOSIS — Z6841 Body Mass Index (BMI) 40.0 and over, adult: Secondary | ICD-10-CM | POA: Diagnosis not present

## 2019-07-13 DIAGNOSIS — R69 Illness, unspecified: Secondary | ICD-10-CM | POA: Diagnosis not present

## 2019-07-13 DIAGNOSIS — G4730 Sleep apnea, unspecified: Secondary | ICD-10-CM | POA: Diagnosis not present

## 2019-07-13 DIAGNOSIS — F429 Obsessive-compulsive disorder, unspecified: Secondary | ICD-10-CM | POA: Diagnosis not present

## 2019-07-13 DIAGNOSIS — E291 Testicular hypofunction: Secondary | ICD-10-CM | POA: Diagnosis not present

## 2019-07-13 DIAGNOSIS — F410 Panic disorder [episodic paroxysmal anxiety] without agoraphobia: Secondary | ICD-10-CM | POA: Diagnosis not present

## 2019-07-19 DIAGNOSIS — Z03818 Encounter for observation for suspected exposure to other biological agents ruled out: Secondary | ICD-10-CM | POA: Diagnosis not present

## 2019-07-19 DIAGNOSIS — Z20828 Contact with and (suspected) exposure to other viral communicable diseases: Secondary | ICD-10-CM | POA: Diagnosis not present

## 2019-07-20 DIAGNOSIS — Z20828 Contact with and (suspected) exposure to other viral communicable diseases: Secondary | ICD-10-CM | POA: Diagnosis not present

## 2019-08-01 DIAGNOSIS — H52209 Unspecified astigmatism, unspecified eye: Secondary | ICD-10-CM | POA: Diagnosis not present

## 2019-08-01 DIAGNOSIS — E119 Type 2 diabetes mellitus without complications: Secondary | ICD-10-CM | POA: Diagnosis not present

## 2019-08-01 DIAGNOSIS — H524 Presbyopia: Secondary | ICD-10-CM | POA: Diagnosis not present

## 2019-08-01 DIAGNOSIS — H5213 Myopia, bilateral: Secondary | ICD-10-CM | POA: Diagnosis not present

## 2019-08-10 DIAGNOSIS — E291 Testicular hypofunction: Secondary | ICD-10-CM | POA: Diagnosis not present

## 2019-08-10 DIAGNOSIS — R69 Illness, unspecified: Secondary | ICD-10-CM | POA: Diagnosis not present

## 2019-12-04 ENCOUNTER — Telehealth: Payer: Self-pay | Admitting: *Deleted

## 2019-12-04 ENCOUNTER — Other Ambulatory Visit: Payer: Self-pay

## 2019-12-04 ENCOUNTER — Ambulatory Visit: Payer: Medicare Other | Admitting: Neurology

## 2019-12-04 ENCOUNTER — Encounter: Payer: Self-pay | Admitting: Neurology

## 2019-12-04 VITALS — BP 146/75 | HR 90 | Ht 71.0 in | Wt 315.0 lb

## 2019-12-04 DIAGNOSIS — G2581 Restless legs syndrome: Secondary | ICD-10-CM | POA: Diagnosis not present

## 2019-12-04 DIAGNOSIS — G4719 Other hypersomnia: Secondary | ICD-10-CM

## 2019-12-04 DIAGNOSIS — G4733 Obstructive sleep apnea (adult) (pediatric): Secondary | ICD-10-CM

## 2019-12-04 DIAGNOSIS — Z6841 Body Mass Index (BMI) 40.0 and over, adult: Secondary | ICD-10-CM

## 2019-12-04 MED ORDER — PHENTERMINE HCL 37.5 MG PO CAPS: 37.5000 mg | ORAL_CAPSULE | ORAL | 5 refills | Status: DC

## 2019-12-04 MED ORDER — ROPINIROLE HCL 1 MG PO TABS: 1.0000 mg | ORAL_TABLET | Freq: Three times a day (TID) | ORAL | 11 refills | Status: DC

## 2019-12-04 NOTE — Progress Notes (Signed)
[facility]  PATIENT: Stephen Franklin DOB: 1965/06/22  REFERRING DOCTOR OR PCP:  Starlyn Skeans, PA-C SOURCE: Patient, notes from [facility], downloads from the CPAP.  _________________________________   HISTORICAL  CHIEF COMPLAINT:  Chief Complaint  Patient presents with  . Follow-up    RM 12, alone. Last seen 09/19/2018. Here to f/u on sleep apnea. On cpap. QMG:QQPYPP (respironics). On B12 inj monthly    HISTORY OF PRESENT ILLNESS:  Stephen Franklin is a 54 yo man with obstructive sleep apnea and memory difficulty  Update 09/19/2018: He uses CPAP nightly about 8 hours a night.   feels he is sleeping well most nights though will have a night or two a week with lower quality of sleep.     He uses CPAP nightly.   H no longer wakes up in the middle of the night when there was leakage.  He does better if he shaves at night and washes his face before he puts the mask on.   He uses a FF mask.   He has only occasional nights he dozes off watching TV and never has EDS during the day (ESS=1).   He also has restless leg syndrome and ropinirole is helping that.  He is planning on having a gastric sleeve operation and is undergoing evaluation and making dietary changes.   He had some chest tightness while doing yardwork a couple weeks ago and had a cardiac catheterization --- he was told that he had only minimal buildup in the arteries.   Mood is doing well.   He feels his memory is doing better than a couple years ago.   Observations/Objective: He is an overweight man in no acute distress.  The head is normocephalic and atraumatic.  The sclera are anicteric.  Pharynx is not erythematous.  Tongue appears normal.  Visible skin appears normal.  He is alert and fully oriented with fluent speech and good attention, focus and memory.  Extraocular muscles are intact.  Facial strength is normal.  Palatal elevation and tongue protrusion is midline.  Trapezius strength is  normal.  Hearing appears normal.  Arm strength and coordination appears normal.  Sensory and reflexes could not be tested today.  Assessment and Plan: Obstructive sleep apnea  Excessive daytime sleepiness  Class 3 severe obesity due to excess calories with serious comorbidity and body mass index (BMI) of 40.0 to 44.9 in adult [facility])  Restless leg syndrome    Update 12/04/2019: He is on CPAP +15 cm.  He l is able to use it on a nightly basis.  He feels better the next day when he uses CPAP for the entire night but he has .  The download shows excellent compliance (100% > 4 hrs and 100% night).  AHI = 5.4  He feels his memory is better, back to baseline.   He denies any excessive daytime sleepiness at this time.  He notes more sleepiness during the day and has reduced focus at times.      RLS is well controlled on ropinirole.     EPWORTH SLEEPINESS SCALE  On a scale of 0 - 3 what is the chance of dozing:  Sitting and Reading:   0 Watching TV:    3 Sitting inactive in a public place: 0 Passenger in car for one hour: 0 Lying down to rest in the afternoon: 3 Sitting and talking to someone: 0 Sitting quietly after lunch:  3 In a car, stopped in traffic:  0  Total (out of 24):     9/24    Was 10/24 before CPAP    Sleep history He was diagnosed with OSA in 2012 after presenting with snoring and EDS.   His first one was done at [facility] and he recalls an AHI = 24.   He was titrated to CPAP +14 cm.   He has excellent compliance (100%) and efficacy (30 day AHI = 2.4 today).     He feels much better the next day when he uses CPAP.   He got a new machine Biomedical engineer) in 2018 or 2019.     REVIEW OF SYSTEMS: Constitutional: No fevers, chills, sweats, or change in appetite.   Mild insomnia/RLS better on CPAP Eyes: No visual changes, double vision, eye pain Ear, nose and throat: No hearing loss, ear pain, nasal congestion, sore throat Cardiovascular: No chest pain,  palpitations Respiratory: No shortness of breath at rest or with exertion.   OSA and occ coughing GastrointestinaI: No nausea, vomiting, diarrhea, abdominal pain, fecal incontinence Genitourinary: No dysuria, urinary retention or frequency.  No nocturia. Musculoskeletal: has some LBP and myalgias Integumentary: No rash, pruritus, skin lesions Neurological: as above Psychiatric: No depression at this time.  No anxiety Endocrine: No palpitations, diaphoresis, change in appetite, change in weigh or increased thirst Hematologic/Lymphatic: No anemia, purpura, petechiae. Allergic/Immunologic: No itchy/runny eyes, nasal congestion, recent allergic reactions, rashes  ALLERGIES: Allergies  Allergen Reactions  . Naproxen Sodium Hives, Shortness Of Breath and Swelling  . Bee Venom Itching    HOME MEDICATIONS:  Current Outpatient Medications:  .  bisoprolol (ZEBETA) 5 MG tablet, Take 1 tablet (5 mg total) by mouth daily., Disp: , Rfl:  .  buPROPion (WELLBUTRIN XL) 150 MG 24 hr tablet, Take 150 mg by mouth daily., Disp: , Rfl:  .  clonazePAM (KLONOPIN) 1 MG tablet, Take 1 mg by mouth 3 (three) times daily as needed (for anxiety). , Disp: , Rfl:  .  Dulaglutide (TRULICITY) 2.11 HE/1.7EY SOPN, Inject into the skin once a week., Disp: , Rfl:  .  fenofibrate 160 MG tablet, Take 160 mg by mouth daily with breakfast. , Disp: , Rfl: 3 .  glipiZIDE (GLUCOTROL XL) 5 MG 24 hr tablet, Take 10 mg by mouth daily with breakfast. , Disp: , Rfl:  .  metFORMIN (GLUCOPHAGE-XR) 500 MG 24 hr tablet, Take 2 tablets (1,000 mg total) by mouth 2 (two) times daily., Disp: , Rfl: 3 .  methocarbamol (ROBAXIN) 500 MG tablet, Take 1 tablet (500 mg total) by mouth 2 (two) times daily as needed., Disp: 20 tablet, Rfl: 0 .  omeprazole (PRILOSEC) 40 MG capsule, Take 40 mg by mouth daily before breakfast. , Disp: , Rfl:  .  pravastatin (PRAVACHOL) 40 MG tablet, Take 40 mg by mouth daily., Disp: , Rfl: 3 .  rOPINIRole (REQUIP)  1 MG tablet, Take 1 tablet (1 mg total) by mouth 3 (three) times daily., Disp: 90 tablet, Rfl: 11 .  sertraline (ZOLOFT) 100 MG tablet, Take 150 mg by mouth daily. , Disp: , Rfl:  .  testosterone cypionate (DEPOTESTOSTERONE CYPIONATE) 200 MG/ML injection, Inject 1 mL into the muscle every 21 ( twenty-one) days. , Disp: , Rfl:  .  traMADol (ULTRAM) 50 MG tablet, Take 50 mg by mouth 4 (four) times daily as needed (pain). , Disp: , Rfl:  .  phentermine 37.5 MG capsule, Take 1 capsule (37.5 mg total) by mouth every morning., Disp: 30 capsule, Rfl: 5  PAST MEDICAL  HISTORY: Past Medical History:  Diagnosis Date  . Anxiety   . Diabetes mellitus without complication (HCC)    Type 2  . Dysrhythmia    palpitations  . GERD (gastroesophageal reflux disease)   . High cholesterol   . History of kidney stones   . Hypothyroidism    hx of correcte no meds now  . Sleep apnea    cpap    PAST SURGICAL HISTORY: Past Surgical History:  Procedure Laterality Date  . COLONOSCOPY    . HERNIA REPAIR     Left inguinal, umbilical with mesh [id number] Dr. Redmond Pulling  . LEFT HEART CATH AND CORONARY ANGIOGRAPHY N/A 09/12/2018   Procedure: LEFT HEART CATH AND CORONARY ANGIOGRAPHY;  Surgeon: Martinique, Peter M, MD;  Location: [place] [facility];  Service: Cardiovascular;  Laterality: N/A;  . UMBILICAL HERNIA REPAIR N/A 06/23/2017   Procedure: LAPAROSCOPIC ASSISTED UMBILICAL HERNIA REPAIR WITH MESH;  Surgeon: Greer Pickerel, MD;  Location: [facility];  Service: General;  Laterality: N/A;    FAMILY HISTORY: Family History  Adopted: Yes  Problem Relation Age of Onset  . Heart failure Mother   . Breast cancer Mother   . Diabetes Mother   . Lung cancer Father     SOCIAL HISTORY:  Social History   Socioeconomic History  . Marital status: Married    Spouse name: Not on file  . Number of children: Not on file  . Years of education: Not on file  . Highest education level: Not on file  Occupational History  . Not on file    Tobacco Use  . Smoking status: Former Smoker    Quit date: 06/21/2010    Years since quitting: 9.4  . Smokeless tobacco: Former Systems developer    Quit date: 06/13/2010  Vaping Use  . Vaping Use: Never used  Substance and Sexual Activity  . Alcohol use: No    Comment: hx of none in 7-8 years  . Drug use: No  . Sexual activity: Never  Other Topics Concern  . Not on file  Social History Narrative  . Not on file   Social Determinants of Health   Financial Resource Strain:   . Difficulty of Paying Living Expenses:   Food Insecurity:   . Worried About Charity fundraiser in the Last Year:   . Arboriculturist in the Last Year:   Transportation Needs:   . Film/video editor (Medical):   Marland Kitchen Lack of Transportation (Non-Medical):   Physical Activity:   . Days of Exercise per Week:   . Minutes of Exercise per Session:   Stress:   . Feeling of Stress :   Social Connections:   . Frequency of Communication with Friends and Family:   . Frequency of Social Gatherings with Friends and Family:   . Attends Religious Services:   . Active Member of Clubs or Organizations:   . Attends Archivist Meetings:   Marland Kitchen Marital Status:   Intimate Partner Violence:   . Fear of Current or Ex-Partner:   . Emotionally Abused:   Marland Kitchen Physically Abused:   . Sexually Abused:      PHYSICAL EXAM  Vitals:   12/04/19 1515  BP: (!) 146/75  Pulse: 90  Weight: (!) 315 lb (142.9 kg)  Height: 5\' 11"  (1.803 m)    Body mass index is 43.93 kg/m.   General: The patient is an obese man in no acute distress   Neurologic Exam  Mental status: The  patient is alert and oriented x 3 at the time of the examination.  Speech is normal.  He appears to have normal short-term memory and focus and attention.  Cranial nerves: Extraocular movements are full.  Facial strength and sensation was normal.  The trapezius strength is normal.  The tongue is midline, and the patient has symmetric elevation of the soft palate.  No obvious hearing deficits are noted.  Motor:  Muscle bulk is normal.   Tone is normal. Strength is  5 / 5 in all 4 extremities.    Coordination: Cerebellar testing reveals good finger-nose-finger  Gait and station: Station is normal.  Gait and tandem walk are normal.  Reflexes: Deep tendon reflexes are symmetric and normal bilaterally.       Obstructive sleep apnea  Excessive daytime sleepiness  Class 3 severe obesity due to excess calories with serious comorbidity and body mass index (BMI) of 40.0 to 44.9 in adult [facility])  Restless leg syndrome   1.   Continue CPAP and change to +17 cm.  We will work with the DME company to make sure that he has supplies as needed. .  We discusseed his BMI is too high for the Inspire device (needs to be 33 or less) 2.   Continue ropinirole for restless leg syndrome.  Continue other medications. 3.   Add phentermine to help weight loss and for excessive daytime sleepiness 4.   If unable to lose weight, will refer to medical weight loss program.    5.   He will return to see [place] in 1 year or sooner if there are new or worsening neurologic symptoms.  32-minute office visit with the majority of the time spent face-to-face for history and physical, discussion/counseling and decision-making.  Additional time with record review and documentation.  Meilah Delrosario A. Felecia Shelling, MD, PhD, FAAN Certified in Neurology, Clinical Neurophysiology, Sleep Medicine, Pain Medicine and Neuroimaging Director, [place] [facility] [address], [place], [place] [address] [phone]

## 2019-12-04 NOTE — Telephone Encounter (Signed)
Called [employer] at [phone] and spoke with Pam. They are unable to locate pt in resmed/respironics as well. They last helped him in 2019.

## 2020-01-31 ENCOUNTER — Telehealth: Payer: Self-pay | Admitting: Cardiovascular Disease

## 2020-01-31 NOTE — Telephone Encounter (Signed)
Loyde is returning Cheryl's call.

## 2020-01-31 NOTE — Telephone Encounter (Signed)
Returned call to patient no answer.LMTC. 

## 2020-01-31 NOTE — Telephone Encounter (Signed)
Spoke to patient he stated he has been having chest pain off and on all week.Stated he has sob with exertion.Today he has been nauseated.No chest pain at present.No extender appointments.Advised if he has anymore chest pain to go to ED.He will keep appointment already scheduled with Dr.Croitoru 9/1 at 9:20 am.

## 2020-01-31 NOTE — Telephone Encounter (Signed)
Pt c/o of Chest Pain: STAT if CP now or developed within 24 hours  1. Are you having CP right now? Yes - sharp pain that goes to his back   2. Are you experiencing any other symptoms (ex. SOB, nausea, vomiting, sweating)? SOB. He is sitting in his recliner right now and complaining of SOB  3. How long have you been experiencing CP? A week   4. Is your CP continuous or coming and going? When he exerts himself it is continuous.   5. Have you taken Nitroglycerin? No   Scheduled him to see Dr. Sallyanne Kuster on 02/13/20 at 9:40am ?  Called triage 2x - no answer.

## 2020-02-13 ENCOUNTER — Ambulatory Visit: Payer: Medicare HMO | Admitting: Cardiovascular Disease

## 2020-02-26 ENCOUNTER — Telehealth: Payer: Self-pay | Admitting: Neurology

## 2020-02-26 NOTE — Telephone Encounter (Signed)
Pt has called to report that he is having many issues with his dream station and really does not want to use it, yet cant really be without it.  Pt states he is having shortness of breath, heat palpations, difficulty breathing.  Pt would like to talk about being switched to another devise if possible, please call.

## 2020-02-26 NOTE — Telephone Encounter (Signed)
Called pt back to further discuss. He has a [employer] resp[employer]ics dream station CPAP machine that is around 56 years old. He feels he dreams at night but does not get restless sleep. The shortness of breath, heat palpations, difficulty breathingare note new sx. He recently went to cardiologist and was cleared. He is unsure what is causing sx. He put a call into [employer] to see if his machine is under recall. He will call back to let me know if it is so we can work on getting him a new machine.

## 2020-02-26 NOTE — Telephone Encounter (Signed)
I'm ok for him to switch but am not sure if insurance will cover.   We can find out from DME

## 2020-02-28 ENCOUNTER — Telehealth: Payer: Self-pay | Admitting: Neurology

## 2020-02-28 NOTE — Telephone Encounter (Signed)
Pt called wanting to know if the provider can send in a new Rx for a new cpap machine since the one he has is in recall. Please advise.

## 2020-02-28 NOTE — Telephone Encounter (Signed)
See other phone note, this is a duplicate.

## 2020-02-28 NOTE — Telephone Encounter (Signed)
Called pt back to further discuss. He has not used ozone cleaner or so clean cleaning solution. He has left machine out in the heat a few times. Has black residue that comes out when he cleans machine sometimes. He will call [employer] at [phone] to try and get a new machine. He will call me back if he runs into any issues.

## 2020-02-28 NOTE — Telephone Encounter (Signed)
Jael sent this message this am: "Pt called wanting to know if the provider can send in a new Rx for a new cpap machine since the one he has is in recall. Please advise."

## 2020-03-20 ENCOUNTER — Institutional Professional Consult (permissible substitution): Payer: Medicare Other | Admitting: Pulmonary Disease

## 2020-04-16 ENCOUNTER — Ambulatory Visit (INDEPENDENT_AMBULATORY_CARE_PROVIDER_SITE_OTHER): Payer: Medicare Other

## 2020-04-16 ENCOUNTER — Other Ambulatory Visit: Payer: Self-pay

## 2020-04-16 ENCOUNTER — Ambulatory Visit: Payer: Medicare Other | Admitting: Pulmonary Disease

## 2020-04-16 ENCOUNTER — Encounter: Payer: Self-pay | Admitting: Pulmonary Disease

## 2020-04-16 VITALS — BP 118/68 | HR 91 | Temp 98.0°F | Ht 71.0 in | Wt 306.8 lb

## 2020-04-16 DIAGNOSIS — G4733 Obstructive sleep apnea (adult) (pediatric): Secondary | ICD-10-CM | POA: Diagnosis not present

## 2020-04-16 DIAGNOSIS — R0609 Other forms of dyspnea: Secondary | ICD-10-CM

## 2020-04-16 DIAGNOSIS — R0600 Dyspnea, unspecified: Secondary | ICD-10-CM

## 2020-04-16 NOTE — Assessment & Plan Note (Addendum)
He has already established with [facility] neurology and has been provided with a new ResMed machine.  He is maintained on 17 cm and is compliant with his new machine that he has obtained in the past week. CPAP download was reviewed which shows good control of events on 17 cm and minimal leak He will continue follow-up with his new sleep provider  He brought an SD card from his Respironics machine and we tried to obtain download on it but no data was noted

## 2020-04-16 NOTE — Assessment & Plan Note (Addendum)
I note that dyspnea is already on his problem list since 2014.  But he does report dyspnea on exertion today that has been ongoing for a year and his main concern is whether this was related to [name]/Respironics CPAP machine that he was using for 2 years. I explained to him that I have not seen this in my patients but would be happy to investigate with chest x-ray and PFTs.  He is an ex-smoker so it is possible that he also has smoking-related airway obstruction. Previous cardiac work-up has been negative. Other possibility is obesity and deconditioning  Regardless, he has obtained a new CPAP machine now which is a ResMed, he likes this he has been using this for about a week.  We will see how his symptoms evolve

## 2020-04-16 NOTE — Patient Instructions (Signed)
  Obtain chest x-ray and schedule PFTs

## 2020-04-16 NOTE — Progress Notes (Signed)
Subjective:    Patient ID: Stephen Franklin, male    DOB: 01-Mar-1966, 54 y.o.   MRN: [id number]  HPI 54 year old morbidly obese man presents for evaluation of shortness of breath.  He was listed as a sleep consult but turns out that he is more interested in investigating his symptoms of shortness of breath and congestion which he believes may be coming from his CPAP machine. I note that OSA was initially diagnosed in 2009 by a sleep study done in [facility] showing an AHI of 22/hour. I note that in 2012 he was placed on CPAP of 14 cm, pressure of which was subsequently increased to 17 cm. It seems that in 2019 he obtained a new [employer] which he claims compliance with . He established with neurology sleep practice and was provided a new CPAP machine about a week ago which she has been using without any problems.  He states that he was using his Respironics machine until that time  He reports that for the past 8 months to a year he has been experiencing increased dyspnea on exertion to the point where he cannot climb stairs.  He sometimes gets shortness of breath after walking a few yards.  He also complains of congestion and occasional dry cough.  He wants his lungs to be checked out, he told my CNA that he is involved in a lawsuit with CPAP machine maker. DME is [employer]  Denies wheezing or frequent chest colds.  There is no pedal edema, orthopnea or paroxysmal nocturnal dyspnea. He smoked about half pack per day starting at age 15 until he quit in 2011, about 12 pack years  Cardiology consultation was reviewed 04/2019, apparently he developed chest pain 08/2018 and underwent cardiac cath which showed normal LV systolic function, 62% 1 vessel disease.  The same consultation report  Only 7% compliance on a CPAP machine  Significant tests/ events reviewed  2009 NPSG -by report-AHI 22/h   Past Medical History:  Diagnosis Date  . Anxiety   . Diabetes mellitus  without complication (HCC)    Type 2  . Dysrhythmia    palpitations  . GERD (gastroesophageal reflux disease)   . High cholesterol   . History of kidney stones   . Hypothyroidism    hx of correcte no meds now  . Sleep apnea    cpap    Past Surgical History:  Procedure Laterality Date  . COLONOSCOPY    . HERNIA REPAIR     Left inguinal, umbilical with mesh 12-14-48 Dr. Redmond Pulling  . LEFT HEART CATH AND CORONARY ANGIOGRAPHY N/A 09/12/2018   Procedure: LEFT HEART CATH AND CORONARY ANGIOGRAPHY;  Surgeon: Martinique, Peter M, MD;  Location: [facility];  Service: Cardiovascular;  Laterality: N/A;  . UMBILICAL HERNIA REPAIR N/A 06/23/2017   Procedure: LAPAROSCOPIC ASSISTED UMBILICAL HERNIA REPAIR WITH MESH;  Surgeon: Greer Pickerel, MD;  Location: [facility];  Service: General;  Laterality: N/A;    Allergies  Allergen Reactions  . Naproxen Sodium Hives, Shortness Of Breath and Swelling  . Bee Venom Itching    Social History   Socioeconomic History  . Marital status: Married    Spouse name: Not on file  . Number of children: Not on file  . Years of education: Not on file  . Highest education level: Not on file  Occupational History  . Not on file  Tobacco Use  . Smoking status: Former Smoker    Quit date: 06/21/2010  Years since quitting: 9.8  . Smokeless tobacco: Former Systems developer    Quit date: 06/13/2010  Vaping Use  . Vaping Use: Never used  Substance and Sexual Activity  . Alcohol use: No    Comment: hx of none in 7-8 years  . Drug use: No  . Sexual activity: Never  Other Topics Concern  . Not on file  Social History Narrative  . Not on file   Social Determinants of Health   Financial Resource Strain:   . Difficulty of Paying Living Expenses: Not on file  Food Insecurity:   . Worried About Charity fundraiser in the Last Year: Not on file  . Ran Out of Food in the Last Year: Not on file  Transportation Needs:   . Lack of Transportation (Medical): Not on file  . Lack of  Transportation (Non-Medical): Not on file  Physical Activity:   . Days of Exercise per Week: Not on file  . Minutes of Exercise per Session: Not on file  Stress:   . Feeling of Stress : Not on file  Social Connections:   . Frequency of Communication with Friends and Family: Not on file  . Frequency of Social Gatherings with Friends and Family: Not on file  . Attends Religious Services: Not on file  . Active Member of Clubs or Organizations: Not on file  . Attends Archivist Meetings: Not on file  . Marital Status: Not on file  Intimate Partner Violence:   . Fear of Current or Ex-Partner: Not on file  . Emotionally Abused: Not on file  . Physically Abused: Not on file  . Sexually Abused: Not on file      Review of Systems     Objective:   Physical Exam  Gen. Pleasant, obese, in no distress, normal affect ENT - no pallor,icterus, no post nasal drip, class 3 airway, thick neck  Neck: No JVD, no thyromegaly, no carotid bruits Lungs: no use of accessory muscles, no dullness to percussion, decreased without rales or rhonchi  Cardiovascular: Rhythm regular, heart sounds  normal, no murmurs or gallops, no peripheral edema Abdomen: soft and non-tender, no hepatosplenomegaly, BS normal. Musculoskeletal: No deformities, no cyanosis or clubbing Neuro:  alert, non focal, no tremors       Assessment & Plan:

## 2020-04-18 NOTE — Progress Notes (Signed)
Called and left detailed message on voicemail per DPR. Advised patient to please feel free to return phone call if he had any questions/concerns to please feel free to call back. Nothing further needed at this time.

## 2020-04-22 ENCOUNTER — Telehealth: Payer: Self-pay

## 2020-04-22 ENCOUNTER — Encounter: Payer: Self-pay | Admitting: Physician Assistant

## 2020-04-22 ENCOUNTER — Telehealth (INDEPENDENT_AMBULATORY_CARE_PROVIDER_SITE_OTHER): Payer: Medicare Other | Admitting: Physician Assistant

## 2020-04-22 VITALS — BP 116/72 | Ht 71.0 in | Wt 302.0 lb

## 2020-04-22 DIAGNOSIS — E119 Type 2 diabetes mellitus without complications: Secondary | ICD-10-CM

## 2020-04-22 DIAGNOSIS — I2510 Atherosclerotic heart disease of native coronary artery without angina pectoris: Secondary | ICD-10-CM

## 2020-04-22 DIAGNOSIS — R002 Palpitations: Secondary | ICD-10-CM | POA: Diagnosis not present

## 2020-04-22 DIAGNOSIS — E785 Hyperlipidemia, unspecified: Secondary | ICD-10-CM

## 2020-04-22 DIAGNOSIS — I10 Essential (primary) hypertension: Secondary | ICD-10-CM | POA: Diagnosis not present

## 2020-04-22 MED ORDER — VASCEPA 0.5 G PO CAPS: 2.0000 g | ORAL_CAPSULE | Freq: Two times a day (BID) | ORAL | 3 refills | Status: DC

## 2020-04-22 NOTE — Telephone Encounter (Signed)
  Patient Consent for Virtual Visit         Stephen Franklin has provided verbal consent on 04/22/2020 for a virtual visit (video or telephone).   CONSENT FOR VIRTUAL VISIT FOR:  Stephen Franklin  By participating in this virtual visit I agree to the following:  I hereby voluntarily request, consent and authorize [facility] and its employed or contracted physicians, physician assistants, nurse practitioners or other licensed health care professionals (the Practitioner), to provide me with telemedicine health care services (the "Services") as deemed necessary by the treating Practitioner. I acknowledge and consent to receive the Services by the Practitioner via telemedicine. I understand that the telemedicine visit will involve communicating with the Practitioner through live audiovisual communication technology and the disclosure of certain medical information by electronic transmission. I acknowledge that I have been given the opportunity to request an in-person assessment or other available alternative prior to the telemedicine visit and am voluntarily participating in the telemedicine visit.  I understand that I have the right to withhold or withdraw my consent to the use of telemedicine in the course of my care at any time, without affecting my right to future care or treatment, and that the Practitioner or I may terminate the telemedicine visit at any time. I understand that I have the right to inspect all information obtained and/or recorded in the course of the telemedicine visit and may receive copies of available information for a reasonable fee.  I understand that some of the potential risks of receiving the Services via telemedicine include:  Marland Kitchen Delay or interruption in medical evaluation due to technological equipment failure or disruption; . Information transmitted may not be sufficient (e.g. poor resolution of images) to allow for appropriate medical decision making by the Practitioner;  and/or  . In rare instances, security protocols could fail, causing a breach of personal health information.  Furthermore, I acknowledge that it is my responsibility to provide information about my medical history, conditions and care that is complete and accurate to the best of my ability. I acknowledge that Practitioner's advice, recommendations, and/or decision may be based on factors not within their control, such as incomplete or inaccurate data provided by me or distortions of diagnostic images or specimens that may result from electronic transmissions. I understand that the practice of medicine is not an exact science and that Practitioner makes no warranties or guarantees regarding treatment outcomes. I acknowledge that a copy of this consent can be made available to me via my patient portal ([facility]), or I can request a printed copy by calling the office of [facility].    I understand that my insurance will be billed for this visit.   I have read or had this consent read to me. . I understand the contents of this consent, which adequately explains the benefits and risks of the Services being provided via telemedicine.  . I have been provided ample opportunity to ask questions regarding this consent and the Services and have had my questions answered to my satisfaction. . I give my informed consent for the services to be provided through the use of telemedicine in my medical care

## 2020-04-22 NOTE — Progress Notes (Signed)
Virtual Visit via Telephone Note   This visit type was conducted due to national recommendations for restrictions regarding the COVID-19 Pandemic (e.g. social distancing) in an effort to limit this patient's exposure and mitigate transmission in our community.  Due to his co-morbid illnesses, this patient is at least at moderate risk for complications without adequate follow up.  This format is felt to be most appropriate for this patient at this time.  The patient did not have access to video technology/had technical difficulties with video requiring transitioning to audio format only (telephone).  All issues noted in this document were discussed and addressed.  No physical exam could be performed with this format.  Please refer to the patient's chart for his  consent to telehealth for [facility].    Date:  04/22/2020   ID:  Stephen Franklin, DOB 02-Jan-1966, MRN [id number] The patient was identified using 2 identifiers.  Patient Location: Home Provider Location: Home Office  PCP:  Patrecia Pour, Christean Grief, MD  Cardiologist:  Sanda Klein, MD  Electrophysiologist:  None   Evaluation Performed:  Follow-Up Visit  Chief Complaint:  Follow up  History of Present Illness:    Stephen Franklin is a 54 y.o. male with PMH of OSA, morbid obesity, DM II, HTN, HLD, frequent PVCs and GERD.  He had a cardiac catheterization in March 2020 for exertional chest pain which only showed minimal disease with 45% stenosis in ostial D1, normal LV function and a normal filling pressure.  His palpitations controlled on low-dose bisoprolol daily.  Patient presents today for his annual physical.  He denies any recent chest pain or shortness of breath.  He is doing well from cardiac perspective.  He has not had a significant palpitation either.  I have reviewed his previous lab work.  Triglyceride is still uncontrolled, I recommended addition of Vascepa 2 g twice a day to his medical regimen.  He is due for repeat  lipid panel in March at [facility].  Otherwise he can follow-up with cardiology service in 1 year.   The patient does not have symptoms concerning for COVID-19 infection (fever, chills, cough, or new shortness of breath).    Past Medical History:  Diagnosis Date  . Anxiety   . Diabetes mellitus without complication (HCC)    Type 2  . Dysrhythmia    palpitations  . GERD (gastroesophageal reflux disease)   . High cholesterol   . History of kidney stones   . Hypothyroidism    hx of correcte no meds now  . Sleep apnea    cpap   Past Surgical History:  Procedure Laterality Date  . COLONOSCOPY    . HERNIA REPAIR     Left inguinal, umbilical with mesh 3-76-28 Dr. Redmond Pulling  . LEFT HEART CATH AND CORONARY ANGIOGRAPHY N/A 09/12/2018   Procedure: LEFT HEART CATH AND CORONARY ANGIOGRAPHY;  Surgeon: Martinique, Peter M, MD;  Location: [facility];  Service: Cardiovascular;  Laterality: N/A;  . UMBILICAL HERNIA REPAIR N/A 06/23/2017   Procedure: LAPAROSCOPIC ASSISTED UMBILICAL HERNIA REPAIR WITH MESH;  Surgeon: Greer Pickerel, MD;  Location: [facility];  Service: General;  Laterality: N/A;     Current Meds  Medication Sig  . bisoprolol (ZEBETA) 5 MG tablet Take 1 tablet (5 mg total) by mouth daily.  Marland Kitchen buPROPion (WELLBUTRIN XL) 150 MG 24 hr tablet Take 150 mg by mouth daily.  . clonazePAM (KLONOPIN) 1 MG tablet Take 1 mg by mouth 3 (three) times daily as needed (  for anxiety).   . Dulaglutide (TRULICITY) 0.25 KY/7.0WC SOPN Inject into the skin once a week.  . fenofibrate 160 MG tablet Take 160 mg by mouth daily with breakfast.   . metFORMIN (GLUCOPHAGE-XR) 500 MG 24 hr tablet Take 2 tablets (1,000 mg total) by mouth 2 (two) times daily.  . methocarbamol (ROBAXIN) 500 MG tablet Take 1 tablet (500 mg total) by mouth 2 (two) times daily as needed.  Marland Kitchen omeprazole (PRILOSEC) 40 MG capsule Take 40 mg by mouth daily before breakfast.   . phentermine 37.5 MG capsule Take 1 capsule (37.5 mg total) by mouth  every morning.  . pravastatin (PRAVACHOL) 40 MG tablet Take 40 mg by mouth daily.  Marland Kitchen rOPINIRole (REQUIP) 1 MG tablet Take 1 tablet (1 mg total) by mouth 3 (three) times daily.  . sertraline (ZOLOFT) 100 MG tablet Take 150 mg by mouth daily.   Marland Kitchen testosterone cypionate (DEPOTESTOSTERONE CYPIONATE) 200 MG/ML injection Inject 1 mL into the muscle every 21 ( twenty-one) days.   . traMADol (ULTRAM) 50 MG tablet Take 50 mg by mouth 4 (four) times daily as needed (pain).      Allergies:   Naproxen sodium and Bee venom   Social History   Tobacco Use  . Smoking status: Former Smoker    Quit date: 06/21/2010    Years since quitting: 9.8  . Smokeless tobacco: Former Systems developer    Quit date: 06/13/2010  Vaping Use  . Vaping Use: Never used  Substance Use Topics  . Alcohol use: No    Comment: hx of none in 7-8 years  . Drug use: No     Family Hx: The patient's family history includes Breast cancer in his mother; Diabetes in his mother; Heart failure in his mother; Lung cancer in his father. He was adopted.  ROS:   Please see the history of present illness.     All other systems reviewed and are negative.   Prior CV studies:   The following studies were reviewed today:  Cath 09/12/2018  Ost 1st Diag to 1st Diag lesion is 45% stenosed.  The left ventricular systolic function is normal.  LV end diastolic pressure is normal.  The left ventricular ejection fraction is 55-65% by visual estimate.   1. Nonobstructive CAD- mild 2. Normal LV function 3. Normal LVEDP  Plan: continue risk factor modification.    Labs/Other Tests and Data Reviewed:    EKG:  An ECG dated 09/12/2018 was personally reviewed today and demonstrated:  Normal sinus rhythm without significant ST-T wave changes.  Recent Labs: No results found for requested labs within last 8760 hours.   Recent Lipid Panel Lab Results  Component Value Date/Time   CHOL 174 01/02/2010 12:37 PM   TRIG 298.0 (H) 01/02/2010 12:37 PM    HDL 30.80 (L) 01/02/2010 12:37 PM   CHOLHDL 6 01/02/2010 12:37 PM   LDLDIRECT 95.1 01/02/2010 12:37 PM    Wt Readings from Last 3 Encounters:  04/22/20 (!) 302 lb (137 kg)  04/16/20 (!) 306 lb 12.8 oz (139.2 kg)  12/04/19 (!) 315 lb (142.9 kg)     Risk Assessment/Calculations:      Objective:    Vital Signs:  BP 116/72   Ht 5\' 11"  (1.803 m)   Wt (!) 302 lb (137 kg)   BMI 42.12 kg/m    VITAL SIGNS:  reviewed  ASSESSMENT & PLAN:    1. Palpitation: Well-controlled on bisoprolol   2. CAD: Previous cardiac catheterization in March 2020  only showed a 45% disease in D1, no other significant coronary artery disease noted.  3. Hypertension: Blood pressure stable  4. Hyperlipidemia: He is on pravastatin.  Total cholesterol and the LDL seems to be well controlled.  However triglyceride is very high.  I recommend the addition of Vascepa 2 g twice a day to his medical regimen.  He is due to have repeat lipid panel at [facility] in March  5. DM2: Managed by primary care provider.   COVID-19 Education: The signs and symptoms of COVID-19 were discussed with the patient and how to seek care for testing (follow up with PCP or arrange E-visit).  The importance of social distancing was discussed today.  Time:   Today, I have spent 20 minutes with the patient with telehealth technology discussing the above problems.     Medication Adjustments/Labs and Tests Ordered: Current medicines are reviewed at length with the patient today.  Concerns regarding medicines are outlined above.   Tests Ordered: No orders of the defined types were placed in this encounter.   Medication Changes: No orders of the defined types were placed in this encounter.   Follow Up:  In Person in 1 year(s)  Signed, Almyra Deforest, [place]  04/22/2020 2:25 PM    [facility]

## 2020-04-22 NOTE — Patient Instructions (Signed)
Medication Instructions:   START Vascepa 2 grams 2 times a day *If you need a refill on your cardiac medications before your next appointment, please call your pharmacy*  Lab Work: NONE ordered at this time of appointment   If you have labs (blood work) drawn today and your tests are completely normal, you will receive your results only by: Marland Kitchen MyChart Message (if you have MyChart) OR . A paper copy in the mail If you have any lab test that is abnormal or we need to change your treatment, we will call you to review the results.  Testing/Procedures: NONE ordered at this time of appointment   Follow-Up: At [facility], you and your health needs are our priority.  As part of our continuing mission to provide you with exceptional heart care, we have created designated Provider Care Teams.  These Care Teams include your primary Cardiologist (physician) and Advanced Practice Providers (APPs -  Physician Assistants and Nurse Practitioners) who all work together to provide you with the care you need, when you need it.  Your next appointment:   1 year(s)  The format for your next appointment:   In Person  Provider:   Sanda Klein, MD  Other Instructions

## 2020-04-23 ENCOUNTER — Telehealth: Payer: Self-pay | Admitting: Physician Assistant

## 2020-04-23 MED ORDER — ICOSAPENT ETHYL 1 G PO CAPS: ORAL_CAPSULE | ORAL | 3 refills | Status: DC

## 2020-04-23 NOTE — Telephone Encounter (Signed)
Pt c/o medication issue:  1. Name of Medication:   Icosapent Ethyl (VASCEPA) 0.5 g CAPS    2. How are you currently taking this medication (dosage and times per day)? 2g 2x daily  3. Are you having a reaction (difficulty breathing--STAT)? no  4. What is your medication issue? Pharmacy wanted to know if the office wanted to update the rx to dispense the 1g tablet instead of the 0.5g tablet.  Please verify with the provider and send a new rx to the pharmacy if needed

## 2020-04-23 NOTE — Telephone Encounter (Signed)
Spoke to pharmacist at [facility] 1 gm take 2 capsules twice a day.

## 2020-04-25 ENCOUNTER — Other Ambulatory Visit: Payer: Self-pay | Admitting: Physician Assistant

## 2020-04-25 MED ORDER — OMEGA-3-ACID ETHYL ESTERS 1 G PO CAPS: 1.0000 g | ORAL_CAPSULE | Freq: Two times a day (BID) | ORAL | 11 refills | Status: DC

## 2020-04-25 NOTE — Telephone Encounter (Signed)
Spoke with Stephen Franklin, he is having side effect from the sample of Vascepa, will discontinue Vascepa. I have called his pharmacy and informed them to remove Vascepa.   He is willing to try Lovaza instead. I have sent new Rx to his pharmacy.

## 2020-04-25 NOTE — Telephone Encounter (Signed)
Pt c/o medication issue:  1. Name of Medication: Icosapent Ethyl (VASCEPA) 0.5 g CAPS  2. How are you currently taking this medication (dosage and times per day)? 2 g by mouth in the morning and at bedtime  3. Are you having a reaction (difficulty breathing--STAT)? No   4. What is your medication issue? Patient is following up. He states he has been experiencing indigestion, palpitations, and constipation for the past few days and he assumes this medication is the cause. Denies lightheadedness, SOB, and CP. However, he states he has been feeling "pressure" in his head (no headache). No additional symptoms. No hx of afib. He states yesterday, 04/24/20, while at PCP his vitals were normal. BP was 119/72 and HR was 86. Patient is requesting to have the dosage decreased. Please return call to advise.

## 2020-05-02 ENCOUNTER — Telehealth: Payer: Self-pay | Admitting: Pulmonary Disease

## 2020-05-02 NOTE — Telephone Encounter (Signed)
Pt is scheduled for 12/2 this must be a insurance thing I do not know if any insurance requires a referral anymore  This would be a question for the front end

## 2020-05-02 NOTE — Telephone Encounter (Signed)
Stephen Franklin back in the office on 05/05/20. Will forward to her to see if she can help answer this question. Thanks!

## 2020-05-09 NOTE — Telephone Encounter (Signed)
No referral required. -pr

## 2020-05-15 ENCOUNTER — Ambulatory Visit: Payer: Medicare Other | Admitting: Pulmonary Disease

## 2020-05-15 ENCOUNTER — Encounter: Payer: Self-pay | Admitting: Pulmonary Disease

## 2020-05-15 ENCOUNTER — Other Ambulatory Visit: Payer: Self-pay

## 2020-05-15 ENCOUNTER — Ambulatory Visit (INDEPENDENT_AMBULATORY_CARE_PROVIDER_SITE_OTHER): Payer: Medicare Other | Admitting: Pulmonary Disease

## 2020-05-15 VITALS — BP 132/80 | HR 88 | Temp 98.2°F | Ht 71.0 in | Wt 306.0 lb

## 2020-05-15 DIAGNOSIS — R0600 Dyspnea, unspecified: Secondary | ICD-10-CM | POA: Diagnosis not present

## 2020-05-15 DIAGNOSIS — G4733 Obstructive sleep apnea (adult) (pediatric): Secondary | ICD-10-CM

## 2020-05-15 DIAGNOSIS — R0609 Other forms of dyspnea: Secondary | ICD-10-CM

## 2020-05-15 DIAGNOSIS — R5381 Other malaise: Secondary | ICD-10-CM

## 2020-05-15 DIAGNOSIS — Z6841 Body Mass Index (BMI) 40.0 and over, adult: Secondary | ICD-10-CM

## 2020-05-15 LAB — PULMONARY FUNCTION TEST
DL/VA % pred: 136 %
DL/VA: 5.87 ml/min/mmHg/L
DLCO cor % pred: 116 %
DLCO cor: 34.08 ml/min/mmHg
DLCO unc % pred: 116 %
DLCO unc: 34.08 ml/min/mmHg
FEF 25-75 Post: 2.85 L/sec
FEF 25-75 Pre: 2.25 L/sec
FEF2575-%Change-Post: 26 %
FEF2575-%Pred-Post: 87 %
FEF2575-%Pred-Pre: 68 %
FEV1-%Change-Post: 5 %
FEV1-%Pred-Post: 77 %
FEV1-%Pred-Pre: 73 %
FEV1-Post: 2.98 L
FEV1-Pre: 2.83 L
FEV1FVC-%Change-Post: 6 %
FEV1FVC-%Pred-Pre: 101 %
FEV6-%Change-Post: -1 %
FEV6-%Pred-Post: 74 %
FEV6-%Pred-Pre: 75 %
FEV6-Post: 3.61 L
FEV6-Pre: 3.65 L
FEV6FVC-%Change-Post: 0 %
FEV6FVC-%Pred-Post: 104 %
FEV6FVC-%Pred-Pre: 104 %
FVC-%Change-Post: -1 %
FVC-%Pred-Post: 71 %
FVC-%Pred-Pre: 72 %
FVC-Post: 3.61 L
FVC-Pre: 3.66 L
Post FEV1/FVC ratio: 83 %
Post FEV6/FVC ratio: 100 %
Pre FEV1/FVC ratio: 77 %
Pre FEV6/FVC Ratio: 100 %
RV % pred: 108 %
RV: 2.41 L
TLC % pred: 92 %
TLC: 6.68 L

## 2020-05-15 NOTE — Progress Notes (Signed)
@Patient  ID: Stephen Franklin, male    DOB: 03-02-1966, 54 y.o.   MRN: [id number]  Chief Complaint  Patient presents with  . Follow-up    PFT     Referring provider: Patrecia Pour, Christean Grief, MD  HPI:  54 year old male former smoker followed in our office for obstructive sleep apnea and dyspnea on exertion  PMH: Obesity, low testosterone, heart palpitations, type 2 diabetes, restless leg syndrome Smoker/ Smoking History: Former smoker. Quit 2012. 27 pack history.  Maintenance: None Pt of: Dr. Elsworth Soho  05/15/2020  - Visit   54 year old male former smoker followed in our office for dyspnea on exertion. He is established with Dr. Elsworth Soho. Had initial consult in November/2021. Plan of care from that office visit was as follows: Obtain chest x-ray, obtain pulmonary function testing, Dr. Elsworth Soho reviewed previous cardiac work-up which has been negative. Other possibilities for shortness of breath is obesity and physical deconditioning. Patient is also instructed to be maintained on CPAP therapy.  04/17/2020-chest x-ray-no acute cardiopulmonary disease  05/15/20-pulmonary function test-FVC 3.66 (72% predicted), postbronchodilator ratio 84, post bronchodilator FEV1 2.98 (77% predicted), no bronchodilator response, TLC 6.68 (92% predicted), DLCO 34.08 (116% predicted)  Patient's had COVID-19 vaccinations including booster. He does not have his card on him today.  Patient is not physically active.  He admits that he leads a sedentary lifestyle.  He has been previously evaluated by cardiology not found to have significant cardiac components to his dyspnea.  He reports adherence using his CPAP.  CPAP compliance report confirms this.  See compliance were listed below:  04/15/2020-05/14/2020-CPAP compliance report-30 had a last 30 days used, all 30 those days greater than 4 hours, average usage 7 hours and 54 minutes, CPAP set pressure 17, AHI 7.7    Questionaires / Pulmonary Flowsheets:   ACT:  No flowsheet  data found.  MMRC: No flowsheet data found.  Epworth:  No flowsheet data found.  Tests:   04/17/2020-chest x-ray-no acute cardiopulmonary disease  05/15/20-pulmonary function test-FVC 3.66 (72% predicted), postbronchodilator ratio 84, post bronchodilator FEV1 2.98 (77% predicted), no bronchodilator response, TLC 6.68 (92% predicted), DLCO 34.08 (116% predicted)  09/12/2018-left heart cath-  Ost 1st Diag to 1st Diag lesion is 45% stenosed.  The left ventricular systolic function is normal.  LV end diastolic pressure is normal.  The left ventricular ejection fraction is 55-65% by visual estimate.   1. Nonobstructive CAD- mild 2. Normal LV function 3. Normal LVEDP  Plan: continue risk factor modification.   FENO:  No results found for: NITRICOXIDE  PFT: No flowsheet data found.  WALK:  No flowsheet data found.  Imaging: DG Chest 2 View  Result Date: 04/17/2020 CLINICAL DATA:  Dyspnea. EXAM: CHEST - 2 VIEW COMPARISON:  Oct 30, 2018. FINDINGS: The heart size and mediastinal contours are within normal limits. No consolidation. No visible pleural effusions or pneumothorax. Thoracic degenerative change without acute osseous abnormality. IMPRESSION: No acute cardiopulmonary disease. Electronically Signed   By: Margaretha Sheffield MD   On: 04/17/2020 17:42    Lab Results:  CBC    Component Value Date/Time   WBC 7.0 09/12/2018 0604   RBC 4.85 09/12/2018 0604   HGB 14.6 09/12/2018 0604   HCT 45.9 09/12/2018 0604   PLT 195 09/12/2018 0604   MCV 94.6 09/12/2018 0604   MCH 30.1 09/12/2018 0604   MCHC 31.8 09/12/2018 0604   RDW 13.3 09/12/2018 0604   LYMPHSABS 1.7 08/22/2012 2023   MONOABS 0.7 08/22/2012 2023   EOSABS 0.1 08/22/2012  2023   BASOSABS 0.0 08/22/2012 2023    BMET    Component Value Date/Time   NA 137 09/12/2018 0604   K 4.2 09/12/2018 0604   CL 106 09/12/2018 0604   CO2 22 09/12/2018 0604   GLUCOSE 198 (H) 09/12/2018 0604   BUN 12 09/12/2018 0604    CREATININE 1.17 09/12/2018 0604   CALCIUM 9.0 09/12/2018 0604   GFRNONAA >60 09/12/2018 0604   GFRAA >60 09/12/2018 0604    BNP No results found for: BNP  ProBNP No results found for: PROBNP  Specialty Problems      Pulmonary Problems   Obstructive sleep apnea    Qualifier: Diagnosis of  By: Doy Mince LPN, Megan        Dyspnea      Allergies  Allergen Reactions  . Naproxen Sodium Hives, Shortness Of Breath and Swelling  . Bee Venom Itching    Immunization History  Administered Date(s) Administered  . Td 06/14/2006    Past Medical History:  Diagnosis Date  . Anxiety   . Diabetes mellitus without complication (HCC)    Type 2  . Dysrhythmia    palpitations  . GERD (gastroesophageal reflux disease)   . High cholesterol   . History of kidney stones   . Hypothyroidism    hx of correcte no meds now  . Sleep apnea    cpap    Tobacco History: Social History   Tobacco Use  Smoking Status Former Smoker  . Packs/day: 1.00  . Years: 27.00  . Pack years: 27.00  . Start date: 06/15/1983  . Quit date: 06/21/2010  . Years since quitting: 9.9  Smokeless Tobacco Former Systems developer  . Quit date: 06/13/2010   Counseling given: Not Answered   Continue to not smoke  Outpatient Encounter Medications as of 05/15/2020  Medication Sig  . bisoprolol (ZEBETA) 5 MG tablet Take 1 tablet (5 mg total) by mouth daily.  Marland Kitchen buPROPion (WELLBUTRIN XL) 150 MG 24 hr tablet Take 150 mg by mouth daily.  . clonazePAM (KLONOPIN) 1 MG tablet Take 1 mg by mouth 3 (three) times daily as needed (for anxiety).   . Dulaglutide (TRULICITY) 6.30 ZS/0.1UX SOPN Inject into the skin once a week.  . fenofibrate 160 MG tablet Take 160 mg by mouth daily with breakfast.   . metFORMIN (GLUCOPHAGE-XR) 500 MG 24 hr tablet Take 2 tablets (1,000 mg total) by mouth 2 (two) times daily.  . methocarbamol (ROBAXIN) 500 MG tablet Take 1 tablet (500 mg total) by mouth 2 (two) times daily as needed.  Marland Kitchen omega-3 acid ethyl  esters (LOVAZA) 1 g capsule Take 1 capsule (1 g total) by mouth 2 (two) times daily.  Marland Kitchen omeprazole (PRILOSEC) 40 MG capsule Take 40 mg by mouth daily before breakfast.   . phentermine 37.5 MG capsule Take 1 capsule (37.5 mg total) by mouth every morning.  . pravastatin (PRAVACHOL) 40 MG tablet Take 40 mg by mouth daily.  Marland Kitchen rOPINIRole (REQUIP) 1 MG tablet Take 1 tablet (1 mg total) by mouth 3 (three) times daily.  . sertraline (ZOLOFT) 100 MG tablet Take 150 mg by mouth daily.   Marland Kitchen testosterone cypionate (DEPOTESTOSTERONE CYPIONATE) 200 MG/ML injection Inject 1 mL into the muscle every 21 ( twenty-one) days.   . traMADol (ULTRAM) 50 MG tablet Take 50 mg by mouth 4 (four) times daily as needed (pain).  (Patient not taking: Reported on 05/15/2020)   No facility-administered encounter medications on file as of 05/15/2020.  Review of Systems  Review of Systems  Constitutional: Negative for activity change, chills, fatigue, fever and unexpected weight change.  HENT: Negative for postnasal drip, rhinorrhea, sinus pressure, sinus pain and sore throat.   Eyes: Negative.   Respiratory: Positive for shortness of breath. Negative for cough and wheezing.   Cardiovascular: Negative for chest pain and palpitations.  Gastrointestinal: Negative for constipation, diarrhea, nausea and vomiting.  Endocrine: Negative.   Genitourinary: Negative.   Musculoskeletal: Negative.   Skin: Negative.   Neurological: Negative for dizziness and headaches.  Psychiatric/Behavioral: Negative.  Negative for dysphoric mood. The patient is not nervous/anxious.   All other systems reviewed and are negative.    Physical Exam  BP 132/80   Pulse 88   Temp 98.2 F (36.8 C)   Ht 5\' 11"  (1.803 m)   Wt (!) 306 lb (138.8 kg)   SpO2 98%   BMI 42.68 kg/m   Wt Readings from Last 5 Encounters:  05/15/20 (!) 306 lb (138.8 kg)  04/22/20 (!) 302 lb (137 kg)  04/16/20 (!) 306 lb 12.8 oz (139.2 kg)  12/04/19 (!) 315 lb (142.9  kg)  04/25/19 (!) 310 lb (140.6 kg)    BMI Readings from Last 5 Encounters:  05/15/20 42.68 kg/m  04/22/20 42.12 kg/m  04/16/20 42.79 kg/m  12/04/19 43.93 kg/m  04/25/19 43.24 kg/m     Physical Exam Vitals and nursing note reviewed.  Constitutional:      General: He is not in acute distress.    Appearance: Normal appearance. He is obese.  HENT:     Head: Normocephalic and atraumatic.     Right Ear: Hearing, tympanic membrane, ear canal and external ear normal. There is no impacted cerumen.     Left Ear: Hearing, tympanic membrane, ear canal and external ear normal. There is no impacted cerumen.     Nose: Nose normal. No mucosal edema or rhinorrhea.     Right Turbinates: Not enlarged.     Left Turbinates: Not enlarged.     Mouth/Throat:     Mouth: Mucous membranes are dry.     Pharynx: Oropharynx is clear. No oropharyngeal exudate.  Eyes:     Pupils: Pupils are equal, round, and reactive to light.  Cardiovascular:     Rate and Rhythm: Normal rate and regular rhythm.     Pulses: Normal pulses.     Heart sounds: Normal heart sounds. No murmur heard.   Pulmonary:     Effort: Pulmonary effort is normal.     Breath sounds: Normal breath sounds. No decreased breath sounds, wheezing or rales.  Musculoskeletal:     Cervical back: Normal range of motion.     Right lower leg: No edema.     Left lower leg: No edema.  Lymphadenopathy:     Cervical: No cervical adenopathy.  Skin:    General: Skin is warm and dry.     Capillary Refill: Capillary refill takes less than 2 seconds.     Findings: No erythema or rash.  Neurological:     General: No focal deficit present.     Mental Status: He is alert and oriented to person, place, and time.     Motor: No weakness.     Coordination: Coordination normal.     Gait: Gait is intact. Gait normal.  Psychiatric:        Mood and Affect: Mood normal.        Behavior: Behavior normal. Behavior is cooperative.  Thought Content:  Thought content normal.        Judgment: Judgment normal.       Assessment & Plan:   Obstructive sleep apnea Reviewed CPAP compliance Excellent compliance BMI 42  plan: Continue CPAP therapy Emphasized need for the patient to work to reduce BMI   Dyspnea Reviewed recent chest x-ray Reviewed pulmonary function testing with patient  Plan: Work to reduce BMI Work on increasing overall physical mobility Offered referral to medical weight management, patient declined at this point in time Follow-up in 6 months  Class 3 severe obesity due to excess calories with serious comorbidity and body mass index (BMI) of 40.0 to 44.9 in adult (HCC) BMI 42.68 Has been evaluated by cardiology for dyspnea Has been evaluated by pulmonary for dyspnea No significant cardiovascular pulmonary contributors to patient's dyspnea Patient on phentermine?  From a previous neurologist  Plan: Offered referral to medical weight management, patient declined Patient reports he will work with primary care on reducing his BMI Patient reports that he will start increasing physical activity Patient is unsure if he supposed to still be taking phentermine, I have encouraged him to follow-up with primary care regarding this  Physical deconditioning Plan: Continue to work on increasing physical mobility Slowly work on increasing cardiovascular exercise, start at 5 to 10 minutes of walking each day, increase by 1 minute each week, goal should be 30 minutes of cardiovascular exercise daily    Return in about 6 months (around 11/13/2020), or if symptoms worsen or fail to improve, for Follow up with Dr. Elsworth Soho.   Lauraine Rinne, NP 05/15/2020   This appointment required 32 minutes of patient care (this includes precharting, chart review, review of results, face-to-face care, etc.).

## 2020-05-15 NOTE — Progress Notes (Signed)
Full PFT performed today. °

## 2020-05-15 NOTE — Assessment & Plan Note (Signed)
Plan: Continue to work on increasing physical mobility Slowly work on increasing cardiovascular exercise, start at 5 to 10 minutes of walking each day, increase by 1 minute each week, goal should be 30 minutes of cardiovascular exercise daily

## 2020-05-15 NOTE — Assessment & Plan Note (Signed)
Reviewed CPAP compliance Excellent compliance BMI 42  plan: Continue CPAP therapy Emphasized need for the patient to work to reduce BMI

## 2020-05-15 NOTE — Patient Instructions (Addendum)
You were seen today by Lauraine Rinne, NP  for:   1. Obstructive sleep apnea  We recommend that you continue using your CPAP daily >>>Keep up the hard work using your device >>> Goal should be wearing this for the entire night that you are sleeping, at least 4 to 6 hours  Remember:  . Do not drive or operate heavy machinery if tired or drowsy.  . Please notify the supply company and office if you are unable to use your device regularly due to missing supplies or machine being broken.  . Work on maintaining a healthy weight and following your recommended nutrition plan  . Maintain proper daily exercise and movement  . Maintaining proper use of your device can also help improve management of other chronic illnesses such as: Blood pressure, blood sugars, and weight management.   BiPAP/ CPAP Cleaning:  >>>Clean weekly, with Dawn soap, and bottle brush.  Set up to air dry. >>> Wipe mask out daily with wet wipe or towelette   2. Dyspnea on exertion 3. Physical deconditioning  Pulmonary function testing today is reassuring  Work on increasing overall physical mobility and activity  Goal to work to reduce BMI by next office visit in 6 months to under 35  We offered a referral to medical weight management today, if you become interested in this just let our office know we can place this referral   Follow Up:    Return in about 6 months (around 11/13/2020), or if symptoms worsen or fail to improve, for Follow up with Dr. Elsworth Soho.   Notification of test results are managed in the following manner: If there are  any recommendations or changes to the  plan of care discussed in office today,  we will contact you and let you know what they are. If you do not hear from [place], then your results are normal and you can view them through your  MyChart account , or a letter will be sent to you. Thank you again for trusting [place] with your care  - Thank you, [facility]    It is flu season:   >>> Best  ways to protect herself from the flu: Receive the yearly flu vaccine, practice good hand hygiene washing with soap and also using hand sanitizer when available, eat a nutritious meals, get adequate rest, hydrate appropriately       Please contact the office if your symptoms worsen or you have concerns that you are not improving.   Thank you for choosing [facility] for your healthcare, and for allowing [place] to partner with you on your healthcare journey. I am thankful to be able to provide care to you today.   Wyn Quaker FNP-C

## 2020-05-15 NOTE — Assessment & Plan Note (Signed)
BMI 42.68 Has been evaluated by cardiology for dyspnea Has been evaluated by pulmonary for dyspnea No significant cardiovascular pulmonary contributors to patient's dyspnea Patient on phentermine?  From a previous neurologist  Plan: Offered referral to medical weight management, patient declined Patient reports he will work with primary care on reducing his BMI Patient reports that he will start increasing physical activity Patient is unsure if he supposed to still be taking phentermine, I have encouraged him to follow-up with primary care regarding this

## 2020-05-15 NOTE — Assessment & Plan Note (Signed)
Reviewed recent chest x-ray Reviewed pulmonary function testing with patient  Plan: Work to reduce BMI Work on increasing overall physical mobility Offered referral to medical weight management, patient declined at this point in time Follow-up in 6 months

## 2020-07-17 ENCOUNTER — Other Ambulatory Visit: Payer: Self-pay | Admitting: Cardiovascular Disease

## 2020-08-04 DIAGNOSIS — J300 Vasomotor rhinitis: Secondary | ICD-10-CM | POA: Insufficient documentation
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
## 2020-11-13 ENCOUNTER — Ambulatory Visit: Payer: Medicare Other | Admitting: Pulmonary Disease

## 2020-11-13 ENCOUNTER — Ambulatory Visit: Payer: Medicare Other | Admitting: Adult Health

## 2020-11-28 DIAGNOSIS — I2510 Atherosclerotic heart disease of native coronary artery without angina pectoris: Secondary | ICD-10-CM | POA: Insufficient documentation

## 2020-11-28 DIAGNOSIS — M5134 Other intervertebral disc degeneration, thoracic region: Secondary | ICD-10-CM | POA: Insufficient documentation

## 2020-11-28 DIAGNOSIS — G8929 Other chronic pain: Secondary | ICD-10-CM | POA: Insufficient documentation

## 2020-11-28 DIAGNOSIS — I10 Essential (primary) hypertension: Secondary | ICD-10-CM | POA: Insufficient documentation

## 2020-11-28 DIAGNOSIS — M1990 Unspecified osteoarthritis, unspecified site: Secondary | ICD-10-CM | POA: Insufficient documentation

## 2020-11-28 DIAGNOSIS — F3342 Major depressive disorder, recurrent, in full remission: Secondary | ICD-10-CM | POA: Insufficient documentation

## 2020-12-08 ENCOUNTER — Other Ambulatory Visit: Payer: Self-pay | Admitting: Cardiovascular Disease

## 2020-12-09 ENCOUNTER — Ambulatory Visit: Payer: Medicare (Managed Care) | Admitting: Neurology

## 2020-12-09 ENCOUNTER — Other Ambulatory Visit: Payer: Self-pay

## 2020-12-09 ENCOUNTER — Telehealth: Payer: Self-pay | Admitting: *Deleted

## 2020-12-09 ENCOUNTER — Encounter: Payer: Self-pay | Admitting: Neurology

## 2020-12-09 VITALS — BP 121/74 | HR 87 | Ht 71.0 in | Wt 288.0 lb

## 2020-12-09 DIAGNOSIS — Z6841 Body Mass Index (BMI) 40.0 and over, adult: Secondary | ICD-10-CM

## 2020-12-09 DIAGNOSIS — G2581 Restless legs syndrome: Secondary | ICD-10-CM

## 2020-12-09 DIAGNOSIS — G4733 Obstructive sleep apnea (adult) (pediatric): Secondary | ICD-10-CM | POA: Diagnosis not present

## 2020-12-09 DIAGNOSIS — G4719 Other hypersomnia: Secondary | ICD-10-CM | POA: Diagnosis not present

## 2020-12-09 MED ORDER — ROPINIROLE HCL 1 MG PO TABS: 1.0000 mg | ORAL_TABLET | Freq: Three times a day (TID) | ORAL | 3 refills | Status: DC

## 2020-12-09 NOTE — Progress Notes (Signed)
Called [employer] ([place]) at [phone] and spoke with [place]. They set up pt with [facility] machine 03/25/20. She will fax CPAP compliance report to [place] at [phone].

## 2020-12-09 NOTE — Progress Notes (Signed)
[facility]  PATIENT: Stephen Franklin DOB: 1966-05-08  REFERRING DOCTOR OR PCP:  Starlyn Skeans, PA-C SOURCE: Patient, notes from [facility], downloads from the CPAP.  _________________________________   HISTORICAL  CHIEF COMPLAINT:  Chief Complaint  Patient presents with   Follow-up    RM 12, alone. Last seen 12/04/19. OSA-CPAP. DME-[employer]. Was supposed to get new machine via [facility] but I contacted them today who said he was not set up with one. Got new machine from [facility]. Set up around 7-8 months ago (around Oct/Nov 2021). Tolerating well, uses nightly.    HISTORY OF PRESENT ILLNESS:  Stephen Franklin is a 55 yo man with obstructive sleep apnea and memory difficulty  Update 12/09/2020: He got a new CPAP machine after the recall around Sept/Oct 2021 [facility])  On the old machine he ws having a lot of congestion and feels that is better.   His AHI has been running 2-5 most nights (1/month around 7-8) .  He is on CPAP +15 cm.  He  is able to use it on a nightly basis for the whole night.  He feels better the next day when he uses CPAP for the entire night but he has .  The download shows excellent compliance (100% > 4 hrs and 100% night).  AHI = 5.4  He feels his memory is better, back to baseline.   He denies any excessive daytime sleepiness at this time.  He notes more sleepiness during the day and has reduced focus at times.      RLS is well controlled on ropinirole.   No side effects.  He takes 2-3 a day.    He has lost 25 pounds.    He had considered bariatric surgery but is trying diet first.     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   Numbness is only in toes.     EPWORTH SLEEPINESS SCALE  On a scale of 0 - 3 what is the chance of dozing:  Sitting and Reading:   0 Watching TV:    1 Sitting inactive in a public place: 0 Passenger in car for one hour: 0 Lying down to rest in the  afternoon: 1 Sitting and talking to someone: 0 Sitting quietly after lunch:  1 In a car, stopped in traffic:  0  Total (out of 24):     3/24    Was 10/24 before CPAP    Sleep history He was diagnosed with OSA in 2012 after presenting with snoring and EDS.   His first one was done at [facility] and he recalls an AHI = 24.   He was titrated to CPAP +14 cm.   He has excellent compliance (100%) and efficacy (30 day AHI = 2.4 today).     He feels much better the next day when he uses CPAP.   He got a new machine ([employer]) in 2021     REVIEW OF SYSTEMS: Constitutional: No fevers, chills, sweats, or change in appetite.   Mild insomnia/RLS better on CPAP Eyes: No visual changes, double vision, eye pain Ear, nose and throat: No hearing loss, ear pain, nasal congestion, sore throat Cardiovascular: No chest pain, palpitations Respiratory:  No shortness of breath at rest or with exertion.   OSA and occ coughing GastrointestinaI: No nausea, vomiting, diarrhea, abdominal pain, fecal incontinence Genitourinary:  No dysuria, urinary retention or frequency.  No nocturia. Musculoskeletal:  has some LBP and myalgias  Integumentary: No rash, pruritus, skin lesions Neurological: as above Psychiatric: No depression at this time.  No anxiety Endocrine: No palpitations, diaphoresis, change in appetite, change in weigh or increased thirst Hematologic/Lymphatic:  No anemia, purpura, petechiae. Allergic/Immunologic: No itchy/runny eyes, nasal congestion, recent allergic reactions, rashes  ALLERGIES: Allergies  Allergen Reactions   Naproxen Sodium Hives, Shortness Of Breath and Swelling   Bee Venom Itching    HOME MEDICATIONS:  Current Outpatient Medications:    bisoprolol (ZEBETA) 5 MG tablet, Take 1 tablet by mouth once daily, Disp: 90 tablet, Rfl: 0   buPROPion (WELLBUTRIN XL) 150 MG 24 hr tablet, Take 150 mg by mouth daily., Disp: , Rfl:    clonazePAM (KLONOPIN) 1 MG tablet, Take 1 mg  by mouth 3 (three) times daily as needed (for anxiety). , Disp: , Rfl:    Dulaglutide (TRULICITY) 1.61 WR/6.0AV SOPN, Inject into the skin once a week., Disp: , Rfl:    fenofibrate 160 MG tablet, Take 160 mg by mouth daily with breakfast. , Disp: , Rfl: 3   methocarbamol (ROBAXIN) 500 MG tablet, Take 1 tablet (500 mg total) by mouth 2 (two) times daily as needed., Disp: 20 tablet, Rfl: 0   omega-3 acid ethyl esters (LOVAZA) 1 g capsule, Take 1 capsule (1 g total) by mouth 2 (two) times daily., Disp: 60 capsule, Rfl: 11   omeprazole (PRILOSEC) 40 MG capsule, Take 40 mg by mouth daily before breakfast. , Disp: , Rfl:    pravastatin (PRAVACHOL) 40 MG tablet, Take 40 mg by mouth daily., Disp: , Rfl: 3   rOPINIRole (REQUIP) 1 MG tablet, Take 1 tablet (1 mg total) by mouth 3 (three) times daily., Disp: 90 tablet, Rfl: 11   sertraline (ZOLOFT) 100 MG tablet, Take 150 mg by mouth daily. , Disp: , Rfl:    testosterone cypionate (DEPOTESTOSTERONE CYPIONATE) 200 MG/ML injection, Inject 1 mL into the muscle every 21 ( twenty-one) days. , Disp: , Rfl:    traMADol (ULTRAM) 50 MG tablet, Take 50 mg by mouth 4 (four) times daily as needed (pain)., Disp: , Rfl:   PAST MEDICAL HISTORY: Past Medical History:  Diagnosis Date   Anxiety    Diabetes mellitus without complication (HCC)    Type 2   Dysrhythmia    palpitations   GERD (gastroesophageal reflux disease)    High cholesterol    History of kidney stones    Hypothyroidism    hx of correcte no meds now   Sleep apnea    cpap    PAST SURGICAL HISTORY: Past Surgical History:  Procedure Laterality Date   COLONOSCOPY     HERNIA REPAIR     Left inguinal, umbilical with mesh 09-21-79 Dr. Redmond Pulling   LEFT HEART CATH AND CORONARY ANGIOGRAPHY N/A 09/12/2018   Procedure: LEFT HEART CATH AND CORONARY ANGIOGRAPHY;  Surgeon: Martinique, Peter M, MD;  Location: [place] [facility];  Service: Cardiovascular;  Laterality: N/A;   UMBILICAL HERNIA REPAIR N/A 06/23/2017    Procedure: LAPAROSCOPIC ASSISTED UMBILICAL HERNIA REPAIR WITH MESH;  Surgeon: Greer Pickerel, MD;  Location: [facility];  Service: General;  Laterality: N/A;    FAMILY HISTORY: Family History  Adopted: Yes  Problem Relation Age of Onset   Heart failure Mother    Breast cancer Mother    Diabetes Mother    Lung cancer Father     SOCIAL HISTORY:  Social History   Socioeconomic History   Marital status: Married    Spouse name: Not on file  Number of children: Not on file   Years of education: Not on file   Highest education level: Not on file  Occupational History   Not on file  Tobacco Use   Smoking status: Former    Packs/day: 1.00    Years: 27.00    Pack years: 27.00    Types: Cigarettes    Start date: 06/15/1983    Quit date: 06/21/2010    Years since quitting: 10.4   Smokeless tobacco: Former    Quit date: 06/13/2010  Vaping Use   Vaping Use: Never used  Substance and Sexual Activity   Alcohol use: No    Comment: hx of none in 7-8 years   Drug use: No   Sexual activity: Never  Other Topics Concern   Not on file  Social History Narrative   Not on file   Social Determinants of Health   Financial Resource Strain: Not on file  Food Insecurity: Not on file  Transportation Needs: Not on file  Physical Activity: Not on file  Stress: Not on file  Social Connections: Not on file  Intimate Partner Violence: Not on file     PHYSICAL EXAM  Vitals:   12/09/20 1254  BP: 121/74  Pulse: 87  Weight: 288 lb (130.6 kg)  Height: 5\' 11"  (1.803 m)    Body mass index is 40.17 kg/m.   General: The patient is an obese man in no acute distress   Neurologic Exam  Mental status: The patient is alert and oriented x 3 at the time of the examination.  Speech is normal.  He appears to have normal short-term memory and focus and attention.  Cranial nerves: Extraocular movements are full.  Facial strength and sensation was normal.  The trapezius strength is normal.   No obvious  hearing deficits are noted.  Motor:  Muscle bulk is normal.   Tone is normal. Strength is  5 / 5 in all 4 extremities.   Sensory:  Reduced vibration sensation in toes only.   Normal pp and normal in arms.      Coordination: Cerebellar testing reveals good finger-nose-finger  Gait and station: Station is normal.  Gait and tandem walk are normal.  Reflexes: Deep tendon reflexes are symmetric and normal bilaterally.        Obstructive sleep apnea  Excessive daytime sleepiness  Restless leg syndrome  Class 3 severe obesity due to excess calories with serious comorbidity and body mass index (BMI) of 40.0 to 44.9 in adult ([place])   1.   Continue CPAP at +15 cm H2O.   Supplies as needed.  We will try to get the download..   2.   Continue ropinirole for restless leg syndrome.  Continue other medications. 3.   e will return to see [place] in 1 year or sooner if there are new or worsening neurologic symptoms.  Averi Cacioppo A. Felecia Shelling, MD, PhD, FAAN Certified in Neurology, Clinical Neurophysiology, Sleep Medicine, Pain Medicine and Neuroimaging Director, [place] at [facility] [address], [place], [place] [address] [phone]

## 2020-12-09 NOTE — Telephone Encounter (Signed)
1324: [employer] ([place]) at [phone] and spoke with [place]. They set up pt with [facility] machine 03/25/20. She will fax CPAP compliance report to [place] at [phone].   I called again to advise we have not received fax yet. [place] said they faxed it but will re-fax. Will also reach out to cpap team to make sure she gave me correct info on what machine he was on. She will call back.

## 2020-12-09 NOTE — Telephone Encounter (Signed)
Received 90 days compliance report from [place] via fax. Pt does not have Luna machine. He has airsense 10. I called pt to let him know. He will discard paper I gave him on Luna machine to do upload. Aware MD will review report. If Dr. Felecia Shelling wants to make any adjustments, we will follow up with him and DME company. I was able to pull up pt on [facility] as well now to pull report.

## 2020-12-22 ENCOUNTER — Telehealth: Payer: Self-pay | Admitting: Cardiovascular Disease

## 2020-12-22 NOTE — Telephone Encounter (Signed)
Received call from patient, pt reports for a "couple weeks" he has been experiencing on and off chest pain and shortness of breath. Pt reports the pain can happen when he is moving or at rest. Pt reports the pain is "not long lasting" and "infrequent", but he also feels lightheaded and sweaty during the episodes. Pt's BP is currently 122/83. Pt denies chest pain at this time. This RN advised patient that he should call 911 or have someone take him to the emergency room if he should experience active chest pain/shortness of breath or other concerning symptoms. Pt verbalized understanding. This RN also made patient an appointment on 12/25/2020 at 3pm with Dr. Sallyanne Kuster. Pt verbalized understanding of instructions and ER precautions. All questions/concerns addressed at this time.

## 2020-12-22 NOTE — Telephone Encounter (Signed)
Called patient left message on personal voice mail to call back. 

## 2020-12-22 NOTE — Telephone Encounter (Signed)
Pt c/o of Chest Pain: 1. Are you having CP right now? Not right now 2. Are you experiencing any other symptoms (ex. SOB, nausea, vomiting, sweating)? A little SOB and break out in sweat some times 3. How long have you been experiencing CP? About 2 weeks 4. Is your CP continuous or coming and going? Coming and going  5. Have you taken Nitroglycerin? No     Pt c/o Shortness Of Breath: STAT if SOB developed within the last 24 hours or pt is noticeably SOB on the phone  1. Are you currently SOB (can you hear that pt is SOB on the phone)? I can him him a little 2. How long have you been experiencing SOB? 2 weeks  3. Are you SOB when sitting or when up moving around? Both  4.  Are you currently experiencing any other symptoms? Face gets flushed

## 2020-12-25 ENCOUNTER — Ambulatory Visit: Payer: Medicare (Managed Care) | Admitting: Cardiovascular Disease

## 2020-12-25 ENCOUNTER — Encounter: Payer: Self-pay | Admitting: Cardiovascular Disease

## 2020-12-25 ENCOUNTER — Other Ambulatory Visit: Payer: Self-pay

## 2020-12-25 VITALS — BP 121/71 | HR 84 | Ht 71.0 in | Wt 294.8 lb

## 2020-12-25 DIAGNOSIS — R002 Palpitations: Secondary | ICD-10-CM | POA: Diagnosis not present

## 2020-12-25 NOTE — Patient Instructions (Signed)

## 2020-12-25 NOTE — Progress Notes (Signed)
Cardiology office note    Evaluation Performed:  Follow-up visit   Date:  12/25/2020   ID:  [name], DOB 1965-07-17, MRN [id number]  PCP:  Sherald Hess., MD  Cardiologist:  Angelize Ryce Electrophysiologist:  None   Chief Complaint:  Precordial hest pain  History of Present Illness:    Stephen Franklin is a 55 y.o. male with morbid obesity and obstructive sleep apnea, type 2 diabetes mellitus, hyperlipidemia, hypertension, GERD and frequent PVCs who underwent cardiac catheterization in 2020 showing minimal plaque without significant obstructive disease (45% ostial first diagonal branch stenosis).  He has normal left ventricular systolic function and normal filling pressures and catheterization.  He continues to be occasionally troubled by episodes of chest tightness.  These occur randomly, at rest and only last for about 10 to 30 seconds at a time.  Associated mild dyspnea and mild lightheadedness.  They can happen up to 3 times a day.  There is no association with physical activity, meals or position.  He underwent cardiac catheterization on 09/12/2018 for exertional chest discomfort which showed only minimal coronary lesions (45% stenosis in the ostium of the first diagonal branch of the LAD artery), normal left ventricular systolic function and normal filling pressures.  His diabetes control has recently been improving.  He is working with Dr. Iran Planas in the endocrinology clinic on this.  His most recent hemoglobin A1c had improved down to 6.8%.  He continues to have a very low HDL cholesterol of 26 and hypertriglyceridemia 379, but his LDL is acceptable at 83.  He reports 100% compliance with CPAP.   Prior CV studies:   The following studies were reviewed today:  Cardiac catheterization March 2020, event monitor June 2020  Past Medical History:  Diagnosis Date   Anxiety    Diabetes mellitus without complication (HCC)    Type 2   Dysrhythmia    palpitations    GERD (gastroesophageal reflux disease)    High cholesterol    History of kidney stones    Hypothyroidism    hx of correcte no meds now   Sleep apnea    cpap   Past Surgical History:  Procedure Laterality Date   COLONOSCOPY     HERNIA REPAIR     Left inguinal, umbilical with mesh 1-74-94 Dr. Redmond Pulling   LEFT HEART CATH AND CORONARY ANGIOGRAPHY N/A 09/12/2018   Procedure: LEFT HEART CATH AND CORONARY ANGIOGRAPHY;  Surgeon: Martinique, Peter M, MD;  Location: [place] [facility];  Service: Cardiovascular;  Laterality: N/A;   UMBILICAL HERNIA REPAIR N/A 06/23/2017   Procedure: LAPAROSCOPIC ASSISTED UMBILICAL HERNIA REPAIR WITH MESH;  Surgeon: Greer Pickerel, MD;  Location: [facility];  Service: General;  Laterality: N/A;     Current Meds  Medication Sig   bisoprolol (ZEBETA) 5 MG tablet Take 1 tablet by mouth once daily   buPROPion (WELLBUTRIN XL) 150 MG 24 hr tablet Take 150 mg by mouth daily.   clonazePAM (KLONOPIN) 1 MG tablet Take 1 mg by mouth 3 (three) times daily as needed (for anxiety).    Dulaglutide (TRULICITY) 4.96 PR/9.1MB SOPN Inject into the skin once a week.   fenofibrate 160 MG tablet Take 160 mg by mouth daily with breakfast.    omega-3 acid ethyl esters (LOVAZA) 1 g capsule Take 1 capsule (1 g total) by mouth 2 (two) times daily.   omeprazole (PRILOSEC) 40 MG capsule Take 40 mg by mouth daily before breakfast.    pravastatin (PRAVACHOL) 40 MG tablet  Take 40 mg by mouth daily.   rOPINIRole (REQUIP) 1 MG tablet Take 1 tablet (1 mg total) by mouth 3 (three) times daily.   sertraline (ZOLOFT) 100 MG tablet Take 150 mg by mouth daily.    testosterone cypionate (DEPOTESTOSTERONE CYPIONATE) 200 MG/ML injection Inject 1 mL into the muscle every 21 ( twenty-one) days.    traMADol (ULTRAM) 50 MG tablet Take 50 mg by mouth 4 (four) times daily as needed (pain).     Allergies:   Naproxen sodium and Bee venom   Social History   Tobacco Use   Smoking status: Former    Packs/day: 1.00     Years: 27.00    Pack years: 27.00    Types: Cigarettes    Start date: 06/15/1983    Quit date: 06/21/2010    Years since quitting: 10.5   Smokeless tobacco: Former    Quit date: 06/13/2010  Vaping Use   Vaping Use: Never used  Substance Use Topics   Alcohol use: No    Comment: hx of none in 7-8 years   Drug use: No     Family Hx: The patient's family history includes Breast cancer in his mother; Diabetes in his mother; Heart failure in his mother; Lung cancer in his father. He was adopted.  ROS:   Please see the history of present illness.     All other systems reviewed and are negative.   Labs/Other Tests and Data Reviewed:    Recent Labs: No results found for requested labs within last 8760 hours.   Recent Lipid Panel Lab Results  Component Value Date/Time   CHOL 174 01/02/2010 12:37 PM   TRIG 298.0 (H) 01/02/2010 12:37 PM   HDL 30.80 (L) 01/02/2010 12:37 PM   CHOLHDL 6 01/02/2010 12:37 PM   LDLDIRECT 95.1 01/02/2010 12:37 PM    Labs in September 01, 2018  show hemoglobin A1c 7.6%, total cholesterol 135, HDL 26, LDL 38, triglycerides 355, creatinine 1.25, potassium 3.9, hemoglobin 14.1.  Labs March 21, 2019 showed hemoglobin A1c 7.6%, total cholesterol 125, HDL 26, LDL 45, triglycerides 270, creatinine 1.06, hemoglobin 14.6, potassium 4.4  Labs January - February 2022 globin A1c 6.8%, creatinine 1.08, total cholesterol 138, HDL 26, LDL 83, triglycerides 379.   Wt Readings from Last 3 Encounters:  12/25/20 294 lb 12.8 oz (133.7 kg)  12/09/20 288 lb (130.6 kg)  05/15/20 (!) 306 lb (138.8 kg)     Exam:    Vital Signs:  BP 121/71   Pulse 84   Ht 5\' 11"  (1.803 m)   Wt 294 lb 12.8 oz (133.7 kg)   SpO2 96%   BMI 41.12 kg/m     General: Alert, oriented x3, no distress, morbidly obese Head: no evidence of trauma, PERRL, EOMI, no exophtalmos or lid lag, no myxedema, no xanthelasma; normal ears, nose and oropharynx Neck: normal jugular venous pulsations and no  hepatojugular reflux; brisk carotid pulses without delay and no carotid bruits Chest: clear to auscultation, no signs of consolidation by percussion or palpation, normal fremitus, symmetrical and full respiratory excursions Cardiovascular: normal position and quality of the apical impulse, regular rhythm, normal first and second heart sounds, no murmurs, rubs or gallops Abdomen: no tenderness or distention, no masses by palpation, no abnormal pulsatility or arterial bruits, normal bowel sounds, no hepatosplenomegaly Extremities: no clubbing, cyanosis or edema; 2+ radial, ulnar and brachial pulses bilaterally; 2+ right femoral, posterior tibial and dorsalis pedis pulses; 2+ left femoral, posterior tibial and dorsalis pedis  pulses; no subclavian or femoral bruits Neurological: grossly nonfocal Psych: Normal mood and affect    ASSESSMENT & PLAN:    1. CAD: Minor disease in a secondary vessel.  His symptoms are not consistent with angina pectoris. 2. HLP: Despite better glycemic control, his triglycerides remain moderately elevated and his HDL is very low.  He needs to increase physical activity to a minimum of 3 hours a week to try to lose weight. 3. DM: Substantial improvement in hemoglobin A1c which is now in target range. 4. Obesity: At this point he does not appear inclined to continue with his plans for bariatric surgery. 5. PVCs: I suspect that his occasional random episodes of 30 seconds of chest tightness/lightheadedness/dyspnea actually represent PVCs.  I encouraged him to get a commercially available rhythm monitor such as a cardia mobile device or a smart watch so that he can record his rhythm during symptoms.  He consented those recordings through [facility].  He is on a beta-blocker which he called metoprolol today.  Per records he is taking bisoprolol.  Asked him to give [place] a call and tell us exactly what he is taking so we can have accurate records.  We can increase the dose of beta-blocker  if needed for better arrhythmia/symptom management.  He has plenty of room to go with his blood pressure and heart rate. 6. HTN: Well-controlled 7. OSA: Reports compliance with CPAP every night in the absence of daytime hypersomnolence. Patient Instructions  Medication Instructions:  No changes *If you need a refill on your cardiac medications before your next appointment, please call your pharmacy*   Lab Work: None ordered If you have labs (blood work) drawn today and your tests are completely normal, you will receive your results only by: [place] (if you have MyChart) OR A paper copy in the mail If you have any lab test that is abnormal or we need to change your treatment, we will call you to review the results.   Testing/Procedures: None ordered   Follow-Up: At [facility], you and your health needs are our priority.  As part of our continuing mission to provide you with exceptional heart care, we have created designated Provider Care Teams.  These Care Teams include your primary Cardiologist (physician) and Advanced Practice Providers (APPs -  Physician Assistants and Nurse Practitioners) who all work together to provide you with the care you need, when you need it.  We recommend signing up for the patient portal called "MyChart".  Sign up information is provided on this After Visit Summary.  MyChart is used to connect with patients for Virtual Visits (Telemedicine).  Patients are able to view lab/test results, encounter notes, upcoming appointments, etc.  Non-urgent messages can be sent to your provider as well.   To learn more about what you can do with MyChart, go to [email or web address].    Your next appointment:   12 month(s)  The format for your next appointment:   In Person  Provider:   You may see Sanda Klein, MD or one of the following Advanced Practice Providers on your designated Care Team:   Almyra Deforest, PA-C Fabian Sharp, [place] or  Roby Lofts,  PA-C    Signed, Sanda Klein, MD  12/25/2020 3:57 PM    [place]

## 2021-01-13 ENCOUNTER — Telehealth: Payer: Self-pay | Admitting: Neurology

## 2021-01-13 NOTE — Telephone Encounter (Addendum)
LVM returning pt call.  Spoke w/ Dr. Felecia Shelling. He recommends Lyrica '75mg'$  po BID #60, 5 refills

## 2021-01-13 NOTE — Telephone Encounter (Signed)
Pt called, Dr. Felecia Shelling said he would prescribe medication for neuropathy in my foot. Can not take Gabapentin. Would like a call from the nurse.

## 2021-02-17 ENCOUNTER — Telehealth: Payer: Self-pay | Admitting: Cardiovascular Disease

## 2021-02-17 NOTE — Telephone Encounter (Signed)
Patient c/o Palpitations:  High priority if patient c/o lightheadedness, shortness of breath, or chest pain  How long have you had palpitations/irregular HR/ Afib? Are you having the symptoms now? Patient is having them now   Are you currently experiencing lightheadedness, SOB or CP? No SOB   Do you have a history of afib (atrial fibrillation) or irregular heart rhythm? Yes   Have you checked your BP or HR? (document readings if available): last night resting 90 BP 115/75  Are you experiencing any other symptoms? Head feels pressure headache for about a week Note:    Patient also states Dr. Sallyanne Kuster had stating maybe a monitor at his last appt.

## 2021-02-17 NOTE — Telephone Encounter (Signed)
Spoke to patient   . Patient states  he  pressure   round temples  , shortness of breath and indigestion   Palp..  Patient could not given me any readings blood pressure or heart heart rate except for last night   RN reassured that the blood pressure reading was not elevated.   Patient had not called his primary. Patient has been taking Bisoprolol 5 mg once daily as prescribed  Will defer to Dr Sallyanne Kuster

## 2021-02-18 NOTE — Telephone Encounter (Signed)
Returned the call to the patient. He stated that he was feeling better today. He has been advised to keep the office updated.   He stated that he has been drinking more coffee and soda lately (up to 5 cups a day). He has been advised that this can increase his palpitations. He has been advised to cut back and see how he does.   Croitoru, Mihai, MD to Raiford Simmonds, RN     9:17 AM  I agree this is likely not cardiac related and definitely not a problem with his BP.

## 2021-03-23 ENCOUNTER — Other Ambulatory Visit: Payer: Self-pay | Admitting: Cardiovascular Disease

## 2021-04-23 ENCOUNTER — Other Ambulatory Visit: Payer: Self-pay

## 2021-04-23 MED ORDER — OMEGA-3-ACID ETHYL ESTERS 1 G PO CAPS
1.0000 g | ORAL_CAPSULE | Freq: Two times a day (BID) | ORAL | 11 refills | Status: DC
Start: 2021-04-23 — End: 2022-04-15

## 2021-05-21 ENCOUNTER — Telehealth: Payer: Self-pay | Admitting: Neurology

## 2021-05-21 NOTE — Telephone Encounter (Signed)
Patient filled our ROI for medical records and picked up records today in office

## 2021-06-17 DIAGNOSIS — J343 Hypertrophy of nasal turbinates: Secondary | ICD-10-CM | POA: Insufficient documentation

## 2021-06-19 DIAGNOSIS — S83241A Other tear of medial meniscus, current injury, right knee, initial encounter: Secondary | ICD-10-CM | POA: Insufficient documentation

## 2021-07-03 ENCOUNTER — Telehealth: Payer: Self-pay | Admitting: Neurology

## 2021-07-03 NOTE — Telephone Encounter (Addendum)
Pt called stating that he is needing the provider to send an Rx for a face mask to [facility] so they can provide him with it. Please advise.

## 2021-07-06 NOTE — Telephone Encounter (Signed)
We received rx via fax for MD to sign. Waiting MD signature and then will fax.

## 2021-07-09 NOTE — Telephone Encounter (Signed)
[facility] called in and wanted to make sure we had their fax #: [phone].

## 2021-07-15 ENCOUNTER — Encounter: Payer: Self-pay | Admitting: Neurology

## 2021-08-12 ENCOUNTER — Encounter: Payer: Self-pay | Admitting: Neurology

## 2021-08-12 ENCOUNTER — Encounter: Payer: Self-pay | Admitting: Cardiovascular Disease

## 2021-08-12 NOTE — Telephone Encounter (Signed)
Sorry. Only option would be an empty DOD slot this week. ?

## 2021-08-13 NOTE — Telephone Encounter (Signed)
Best thing is to get the supposedly abnormal ECG and the one to follow. I have a feeling it was a lead placement issue (one ECG reports "septal infarct", then normal, which suggests that the leads were placed too high on the first one) ?

## 2021-08-17 ENCOUNTER — Other Ambulatory Visit: Payer: Self-pay | Admitting: *Deleted

## 2021-08-17 ENCOUNTER — Telehealth: Payer: Self-pay | Admitting: *Deleted

## 2021-08-17 DIAGNOSIS — R0689 Other abnormalities of breathing: Secondary | ICD-10-CM

## 2021-08-17 DIAGNOSIS — G4719 Other hypersomnia: Secondary | ICD-10-CM

## 2021-08-17 DIAGNOSIS — G4733 Obstructive sleep apnea (adult) (pediatric): Secondary | ICD-10-CM

## 2021-08-17 NOTE — Telephone Encounter (Signed)
Looks like pt never answered Dr. Lavonia Drafts on 08/12/21 about referral to pulmonology. I placed referral. Can you please f/u with pt? Thank you ?

## 2021-08-17 NOTE — Telephone Encounter (Signed)
Patient called need update on his Pulmonary referral. Please call [phone]  ?

## 2021-08-18 ENCOUNTER — Telehealth: Payer: Self-pay | Admitting: Neurology

## 2021-08-18 NOTE — Telephone Encounter (Signed)
Referral sent to [facility]. ?

## 2021-08-19 ENCOUNTER — Other Ambulatory Visit: Payer: Self-pay | Admitting: Neurology

## 2021-08-19 DIAGNOSIS — R0602 Shortness of breath: Secondary | ICD-10-CM

## 2021-08-19 DIAGNOSIS — G4733 Obstructive sleep apnea (adult) (pediatric): Secondary | ICD-10-CM

## 2021-08-19 NOTE — Telephone Encounter (Signed)
Referral sent to [facility]. ?

## 2021-09-14 ENCOUNTER — Institutional Professional Consult (permissible substitution): Payer: Medicare (Managed Care) | Admitting: Primary Care

## 2021-09-15 ENCOUNTER — Encounter: Payer: Self-pay | Admitting: Cardiovascular Disease

## 2021-09-16 MED ORDER — PRAVASTATIN SODIUM 40 MG PO TABS: 40.0000 mg | ORAL_TABLET | Freq: Every day | ORAL | 3 refills | Status: DC

## 2021-09-21 ENCOUNTER — Other Ambulatory Visit: Payer: Self-pay

## 2021-09-21 MED ORDER — ROPINIROLE HCL 1 MG PO TABS: 1.0000 mg | ORAL_TABLET | Freq: Three times a day (TID) | ORAL | 0 refills | Status: DC

## 2021-09-23 ENCOUNTER — Encounter: Payer: Self-pay | Admitting: Primary Care

## 2021-09-23 ENCOUNTER — Ambulatory Visit (INDEPENDENT_AMBULATORY_CARE_PROVIDER_SITE_OTHER): Payer: Medicare (Managed Care)

## 2021-09-23 ENCOUNTER — Ambulatory Visit (INDEPENDENT_AMBULATORY_CARE_PROVIDER_SITE_OTHER): Payer: Medicare (Managed Care) | Admitting: Primary Care

## 2021-09-23 VITALS — BP 114/80 | HR 83 | Temp 98.1°F | Ht 71.0 in | Wt 290.8 lb

## 2021-09-23 DIAGNOSIS — R0609 Other forms of dyspnea: Secondary | ICD-10-CM | POA: Diagnosis not present

## 2021-09-23 DIAGNOSIS — G4733 Obstructive sleep apnea (adult) (pediatric): Secondary | ICD-10-CM | POA: Diagnosis not present

## 2021-09-23 DIAGNOSIS — Z6841 Body Mass Index (BMI) 40.0 and over, adult: Secondary | ICD-10-CM

## 2021-09-23 NOTE — Patient Instructions (Addendum)
Orders: ?CXR today ?PFTs (ordered) ?ONO on CPAP (ordered) ? ?Follow-up: ?4-6 weeks with Dr Elsworth Soho - he has opening in May (1 hour PFTs prior) ?

## 2021-09-23 NOTE — Assessment & Plan Note (Signed)
-   Managed by Dr. Felecia Shelling ?

## 2021-09-23 NOTE — Assessment & Plan Note (Addendum)
-   Dyspnea symptoms started after using recalled [employer]'s CPAP from 2018-2020.  PFTs in 2021 showed mild restriction and increased diffusion capacity. Restriction likely from obesity. Recommend checking CXR today and repeating PFTs to assess for change in lung function. FU with Dr. Elsworth Soho in 4-6 weeks to review.  ?

## 2021-09-23 NOTE — Progress Notes (Addendum)
? ?'@Patient'$  ID: Stephen Franklin, male    DOB: [birth year], 56 y.o.   MRN: [id number] ? ?No chief complaint on file. ? ? ?Referring provider: ?Sater, Nanine Means, MD ? ?HPI: ?56 year old male, former smoker quit in 2012 (27-pack-year history).  Past medical history significant for obstructive sleep apnea.  Former patient of Dr. Elsworth Soho. ? ?09/23/2021 ?Patient presents today for sleep consult. Referred by Dr. Felecia Shelling, who manages his sleep apnea currently. He is here today for evaluation of his dyspnea symptoms which started in 2019 after using recalled philip's CPAP machine. He used this machine for approximately 3 years between 2018-2020. He reports having no energy or stamina. He has been having issues with shortness of breath especially when he exerts himself or with inclines/stairs. After recall he rented a CPAP machine until he was able to get a replacement from [employer]. He states that he can not sleep without using CPAP device. He has an occasional productive cough. He had pulmonary test done in 2021 that showed mild restriction and increased diffusion capacity.  ? ?Sleep questionnaire ?Symptoms-   Breathing issues since using recalled philip's CPAP unit  ?Prior sleep study- [facility] 2009 (not in epic) ?Bedtime- 11-11:30pm ?Time to fall asleep- 10 min ?Nocturnal awakenings- 2 ?Out of bed/start of day- 9am ?Weight changes- 14 lbs ?Do you operate heavy machinery- No  ?Do you currently wear CPAP- Yes  ?Do you current wear oxygen- No  ?Epworth- 7 ? ? ?Allergies  ?Allergen Reactions  ? Epinephrine Shortness Of Breath  ?  Shortness of breath and face swells  ? Naproxen Sodium Hives, Shortness Of Breath and Swelling  ? Bee Venom Itching  ? ? ?Immunization History  ?Administered Date(s) Administered  ? Influenza Split 04/01/2017, 03/13/2018, 02/19/2019  ? Influenza,inj,Quad PF,6+ Mos 03/14/2018, 03/01/2019, 02/19/2020  ? Influenza,inj,Quad PF,6-35 Mos 03/01/2019  ? Influenza-Unspecified 03/13/2013  ? Pneumococcal  Conjugate-13 03/21/2019  ? Pneumococcal Polysaccharide-23 03/14/2018  ? Td 06/14/2006  ? Tdap 08/25/2016  ? ? ?Past Medical History:  ?Diagnosis Date  ? Anxiety   ? Diabetes mellitus without complication ([place])   ? Type 2  ? Dysrhythmia   ? palpitations  ? GERD (gastroesophageal reflux disease)   ? High cholesterol   ? History of kidney stones   ? Hypothyroidism   ? hx of correcte no meds now  ? Sleep apnea   ? cpap  ? ? ?Tobacco History: ?Social History  ? ?Tobacco Use  ?Smoking Status Former  ? Packs/day: 1.00  ? Years: 27.00  ? Pack years: 27.00  ? Types: Cigarettes  ? Start date: 06/15/1983  ? Quit date: 06/21/2010  ? Years since quitting: 11.2  ?Smokeless Tobacco Former  ? Quit date: 06/13/2010  ? ?Counseling given: Not Answered ? ? ?Outpatient Medications Prior to Visit  ?Medication Sig Dispense Refill  ? bisoprolol (ZEBETA) 5 MG tablet Take 1 tablet by mouth once daily 90 tablet 3  ? buPROPion (WELLBUTRIN XL) 150 MG 24 hr tablet Take 150 mg by mouth daily.    ? clonazePAM (KLONOPIN) 1 MG tablet Take 1 mg by mouth 3 (three) times daily as needed (for anxiety).     ? Dulaglutide (TRULICITY) 1.30 QM/5.7QI SOPN Inject into the skin once a week.    ? fenofibrate 160 MG tablet Take 160 mg by mouth daily with breakfast.   3  ? omega-3 acid ethyl esters (LOVAZA) 1 g capsule Take 1 capsule (1 g total) by mouth 2 (two) times daily. 60 capsule 11  ?  omeprazole (PRILOSEC) 40 MG capsule Take 40 mg by mouth daily before breakfast.     ? pravastatin (PRAVACHOL) 40 MG tablet Take 1 tablet (40 mg total) by mouth daily. 30 tablet 3  ? rOPINIRole (REQUIP) 1 MG tablet Take 1 tablet (1 mg total) by mouth 3 (three) times daily. Must keep upcoming appt for continued refills 270 tablet 0  ? sertraline (ZOLOFT) 100 MG tablet Take 150 mg by mouth daily.     ? testosterone cypionate (DEPOTESTOSTERONE CYPIONATE) 200 MG/ML injection Inject 1 mL into the muscle every 21 ( twenty-one) days.     ? traMADol (ULTRAM) 50 MG tablet Take 50 mg by  mouth 4 (four) times daily as needed (pain).    ? ?No facility-administered medications prior to visit.  ? ?Review of Systems ? ?Review of Systems  ?Constitutional: Negative.   ?HENT: Negative.    ?Respiratory:  Positive for cough and shortness of breath. Negative for chest tightness and wheezing.   ?Cardiovascular: Negative.   ? ? ?Physical Exam ? ?BP 114/80 (BP Location: Right Arm, Patient Position: Sitting, Cuff Size: Large)   Pulse 83   Temp 98.1 ?F (36.7 ?C) (Oral)   Ht '5\' 11"'$  (1.803 m)   Wt 290 lb 12.8 oz (131.9 kg)   SpO2 95%   BMI 40.56 kg/m?  ?Physical Exam ?Constitutional:   ?   Appearance: Normal appearance. He is obese.  ?HENT:  ?   Mouth/Throat:  ?   Mouth: Mucous membranes are moist.  ?   Pharynx: Oropharynx is clear.  ?Cardiovascular:  ?   Rate and Rhythm: Normal rate and regular rhythm.  ?Pulmonary:  ?   Effort: Pulmonary effort is normal.  ?   Breath sounds: Normal breath sounds. No wheezing, rhonchi or rales.  ?Skin: ?   General: Skin is warm and dry.  ?Neurological:  ?   General: No focal deficit present.  ?   Mental Status: He is alert and oriented to person, place, and time. Mental status is at baseline.  ?Psychiatric:     ?   Mood and Affect: Mood normal.     ?   Behavior: Behavior normal.     ?   Thought Content: Thought content normal.     ?   Judgment: Judgment normal.  ?  ? ?Lab Results: ? ?CBC ?   ?Component Value Date/Time  ? WBC 7.0 09/12/2018 0604  ? RBC 4.85 09/12/2018 0604  ? HGB 14.6 09/12/2018 0604  ? HCT 45.9 09/12/2018 0604  ? PLT 195 09/12/2018 0604  ? MCV 94.6 09/12/2018 0604  ? MCH 30.1 09/12/2018 0604  ? MCHC 31.8 09/12/2018 0604  ? RDW 13.3 09/12/2018 0604  ? LYMPHSABS 1.7 08/22/2012 2023  ? MONOABS 0.7 08/22/2012 2023  ? EOSABS 0.1 08/22/2012 2023  ? BASOSABS 0.0 08/22/2012 2023  ? ? ?BMET ?   ?Component Value Date/Time  ? NA 137 09/12/2018 0604  ? K 4.2 09/12/2018 0604  ? CL 106 09/12/2018 0604  ? CO2 22 09/12/2018 0604  ? GLUCOSE 198 (H) 09/12/2018 0604  ? BUN 12  09/12/2018 0604  ? CREATININE 1.17 09/12/2018 0604  ? CALCIUM 9.0 09/12/2018 0604  ? GFRNONAA >60 09/12/2018 0604  ? GFRAA >60 09/12/2018 0604  ? ? ?BNP ?No results found for: BNP ? ?ProBNP ?No results found for: PROBNP ? ?Imaging: ?DG Chest 2 View ? ?Result Date: 09/24/2021 ?CLINICAL DATA:  56 year old male with a history of shortness of breath EXAM: CHEST - 2  VIEW COMPARISON:  04/16/2020 FINDINGS: Cardiomediastinal silhouette unchanged in size and contour. No evidence of central vascular congestion. No interlobular septal thickening. No pneumothorax or pleural effusion. Coarsened interstitial markings, with no confluent airspace disease. No acute displaced fracture. Degenerative changes of the spine. IMPRESSION: No active cardiopulmonary disease. Electronically Signed   By: Corrie Mckusick D.O.   On: 09/24/2021 10:56   ? ? ?Assessment & Plan:  ? ?Obstructive sleep apnea ?- Managed by Dr. Felecia Shelling ? ?Dyspnea ?- Dyspnea symptoms started after using recalled philip's CPAP from 2018-2020.  PFTs in 2021 showed mild restriction and increased diffusion capacity. Restriction likely from obesity. Recommend checking CXR today and repeating PFTs to assess for change in lung function. FU with Dr. Elsworth Soho in 4-6 weeks to review.  ? ?Class 3 severe obesity due to excess calories with serious comorbidity and body mass index (BMI) of 40.0 to 44.9 in adult [facility]) ?- Encourage weight loss efforts  ? ? ?Martyn Ehrich, NP ?10/02/2021 ? ?

## 2021-09-23 NOTE — Assessment & Plan Note (Signed)
-   Encourage weight loss efforts  ?

## 2021-09-24 NOTE — Progress Notes (Signed)
CXR without active cardiopulmonary disease. He did have coarsened interstitial markings. Recommend getting PFTs that have been ordered, if DLCO decreased would get CT chest imaging

## 2021-09-30 ENCOUNTER — Ambulatory Visit: Payer: Medicare (Managed Care) | Admitting: Pulmonary Disease

## 2021-09-30 DIAGNOSIS — R0609 Other forms of dyspnea: Secondary | ICD-10-CM

## 2021-09-30 LAB — PULMONARY FUNCTION TEST
DL/VA % pred: 127 %
DL/VA: 5.43 ml/min/mmHg/L
DLCO cor % pred: 116 %
DLCO cor: 33.69 ml/min/mmHg
DLCO unc % pred: 116 %
DLCO unc: 33.69 ml/min/mmHg
FEF 25-75 Post: 3.18 L/sec
FEF 25-75 Pre: 2.86 L/sec
FEF2575-%Change-Post: 10 %
FEF2575-%Pred-Post: 98 %
FEF2575-%Pred-Pre: 88 %
FEV1-%Change-Post: 2 %
FEV1-%Pred-Post: 85 %
FEV1-%Pred-Pre: 82 %
FEV1-Post: 3.27 L
FEV1-Pre: 3.18 L
FEV1FVC-%Change-Post: 3 %
FEV1FVC-%Pred-Pre: 104 %
FEV6-%Change-Post: 0 %
FEV6-%Pred-Post: 82 %
FEV6-%Pred-Pre: 83 %
FEV6-Post: 3.97 L
FEV6-Pre: 4 L
FEV6FVC-%Pred-Post: 104 %
FEV6FVC-%Pred-Pre: 104 %
FVC-%Change-Post: 0 %
FVC-%Pred-Post: 78 %
FVC-%Pred-Pre: 79 %
FVC-Post: 3.97 L
FVC-Pre: 4 L
Post FEV1/FVC ratio: 82 %
Post FEV6/FVC ratio: 100 %
Pre FEV1/FVC ratio: 79 %
Pre FEV6/FVC Ratio: 100 %
RV % pred: 131 %
RV: 2.95 L
TLC % pred: 96 %
TLC: 6.9 L

## 2021-10-01 ENCOUNTER — Ambulatory Visit: Payer: Medicare (Managed Care) | Admitting: Nurse Practitioner

## 2021-10-02 ENCOUNTER — Ambulatory Visit (INDEPENDENT_AMBULATORY_CARE_PROVIDER_SITE_OTHER): Payer: Medicare (Managed Care) | Admitting: Primary Care

## 2021-10-02 ENCOUNTER — Telehealth: Payer: Self-pay | Admitting: Primary Care

## 2021-10-02 ENCOUNTER — Encounter: Payer: Self-pay | Admitting: Primary Care

## 2021-10-02 VITALS — BP 118/76 | HR 81 | Temp 98.0°F | Ht 71.0 in | Wt 293.6 lb

## 2021-10-02 DIAGNOSIS — R9389 Abnormal findings on diagnostic imaging of other specified body structures: Secondary | ICD-10-CM

## 2021-10-02 DIAGNOSIS — R0609 Other forms of dyspnea: Secondary | ICD-10-CM

## 2021-10-02 DIAGNOSIS — G4733 Obstructive sleep apnea (adult) (pediatric): Secondary | ICD-10-CM

## 2021-10-02 NOTE — Telephone Encounter (Signed)
Thank you kindly

## 2021-10-02 NOTE — Progress Notes (Signed)
? ?'@Patient'$  ID: Stephen Franklin, male    DOB: 11-12-1965, 56 y.o.   MRN: [id number] ? ?No chief complaint on file. ? ? ?Referring provider: ?Sherald Hess., MD ? ?HPI: ?56 year old male, former smoker quit in 2012 (27-pack-year history).  Past medical history significant for obstructive sleep apnea.  Former patient of Dr. Elsworth Soho. ? ? ?Previous [facility] pulmonary encounter: ?09/23/2021 ?Patient presents today for sleep consult. Referred by Dr. Felecia Shelling, who manages his sleep apnea currently. He is here today for evaluation of his dyspnea symptoms which started in 2019 after using recalled philip's CPAP machine. He used this machine for approximately 3 years between 2018-2020. He reports having no energy or stamina. He has been having issues with shortness of breath especially when he exerts himself or with inclines/stairs. After recall he rented a CPAP machine until he was able to get a replacement from [employer]. He states that he can not sleep without using CPAP device. He has an occasional productive cough. He had pulmonary test done in 2021 that showed mild restriction and increased diffusion capacity.  ? ?Sleep questionnaire ?Symptoms-   Breathing issues since using recalled philip's CPAP unit  ?Prior sleep study- [facility] 2009 (not in epic) ?Bedtime- 11-11:30pm ?Time to fall asleep- 10 min ?Nocturnal awakenings- 2 ?Out of bed/start of day- 9am ?Weight changes- 14 lbs ?Do you operate heavy machinery- No  ?Do you currently wear CPAP- Yes  ?Do you current wear oxygen- No  ?Epworth- 7 ? ? ?10/02/2021- Interim hx  ?Patient presents today for follow-up/PFTs. Dyspnea symptoms started after using recalled [employer] CPAP from 2018-2020. He had no issues with his breathing prior to using CPAP in 2018. PFTs in 2021 showed mild restriction and increased diffusion capacity. Repeat function function testing today showed improvement in lung function compared to 2021. CXR showed coarsened interstitial markings, no airspace disease.  He had a CT abd in 2013 did show mild subpleural reticulation/dependent atelectasis at lung bases. Recommend checking HRCT to rule out ILD. He is compliant with CPAP, managed by Dr. Felecia Shelling. Need ONO on CPAP to assess for oxygen need.  ? ?Pulmonary function testing: ?05/15/20 >> FVC 3.61 (71%), FEV1 2.98 (77%), ratio 83, TLC 92%, DLCO unc 34.08 (116%) ? ?09/30/21 >> FVC 3.97 (78%), FEV1 3.27 (85%), ratio 82, TLC 96%, DLCOunc 33.69 (116%)  ? ?Allergies  ?Allergen Reactions  ? Epinephrine Shortness Of Breath  ?  Shortness of breath and face swells  ? Naproxen Sodium Hives, Shortness Of Breath and Swelling  ? Bee Venom Itching  ? ? ?Immunization History  ?Administered Date(s) Administered  ? Influenza Split 04/01/2017, 03/13/2018, 02/19/2019  ? Influenza,inj,Quad PF,6+ Mos 03/14/2018, 03/01/2019, 02/19/2020  ? Influenza,inj,Quad PF,6-35 Mos 03/01/2019  ? Influenza-Unspecified 03/13/2013  ? Pneumococcal Conjugate-13 03/21/2019  ? Pneumococcal Polysaccharide-23 03/14/2018  ? Td 06/14/2006  ? Tdap 08/25/2016  ? ? ?Past Medical History:  ?Diagnosis Date  ? Anxiety   ? Diabetes mellitus without complication ([place])   ? Type 2  ? Dysrhythmia   ? palpitations  ? GERD (gastroesophageal reflux disease)   ? High cholesterol   ? History of kidney stones   ? Hypothyroidism   ? hx of correcte no meds now  ? Sleep apnea   ? cpap  ? ? ?Tobacco History: ?Social History  ? ?Tobacco Use  ?Smoking Status Former  ? Packs/day: 1.00  ? Years: 27.00  ? Pack years: 27.00  ? Types: Cigarettes  ? Start date: 06/15/1983  ? Quit date: 06/21/2010  ?  Years since quitting: 11.2  ?Smokeless Tobacco Former  ? Quit date: 06/13/2010  ? ?Counseling given: Not Answered ? ? ?Outpatient Medications Prior to Visit  ?Medication Sig Dispense Refill  ? bisoprolol (ZEBETA) 5 MG tablet Take 1 tablet by mouth once daily 90 tablet 3  ? buPROPion (WELLBUTRIN XL) 150 MG 24 hr tablet Take 150 mg by mouth daily.    ? clonazePAM (KLONOPIN) 1 MG tablet Take 1 mg by mouth 3 (three)  times daily as needed (for anxiety).     ? Dulaglutide (TRULICITY) 3.35 KT/6.2BW SOPN Inject into the skin once a week.    ? fenofibrate 160 MG tablet Take 160 mg by mouth daily with breakfast.   3  ? omega-3 acid ethyl esters (LOVAZA) 1 g capsule Take 1 capsule (1 g total) by mouth 2 (two) times daily. 60 capsule 11  ? omeprazole (PRILOSEC) 40 MG capsule Take 40 mg by mouth daily before breakfast.     ? pravastatin (PRAVACHOL) 40 MG tablet Take 1 tablet (40 mg total) by mouth daily. 30 tablet 3  ? rOPINIRole (REQUIP) 1 MG tablet Take 1 tablet (1 mg total) by mouth 3 (three) times daily. Must keep upcoming appt for continued refills 270 tablet 0  ? sertraline (ZOLOFT) 100 MG tablet Take 150 mg by mouth daily.     ? testosterone cypionate (DEPOTESTOSTERONE CYPIONATE) 200 MG/ML injection Inject 1 mL into the muscle every 21 ( twenty-one) days.     ? ?No facility-administered medications prior to visit.  ? ? ?Review of Systems ? ?Review of Systems  ?Constitutional: Negative.   ?HENT: Negative.    ?Respiratory:  Positive for shortness of breath.   ?Psychiatric/Behavioral: Negative.    ? ? ?Physical Exam ? ?BP 118/76 (BP Location: Right Arm, Patient Position: Sitting, Cuff Size: Large)   Pulse 81   Temp 98 ?F (36.7 ?C) (Oral)   Ht '5\' 11"'$  (1.803 m)   Wt 293 lb 9.6 oz (133.2 kg)   SpO2 97%   BMI 40.95 kg/m?  ?Physical Exam ?Constitutional:   ?   General: He is not in acute distress. ?   Appearance: Normal appearance. He is not ill-appearing.  ?HENT:  ?   Head: Normocephalic and atraumatic.  ?Cardiovascular:  ?   Rate and Rhythm: Normal rate.  ?Pulmonary:  ?   Effort: Pulmonary effort is normal.  ?   Breath sounds: No wheezing.  ?Musculoskeletal:     ?   General: Normal range of motion.  ?Skin: ?   General: Skin is warm and dry.  ?Neurological:  ?   General: No focal deficit present.  ?   Mental Status: He is alert and oriented to person, place, and time. Mental status is at baseline.  ?Psychiatric:     ?   Mood and  Affect: Mood normal.     ?   Behavior: Behavior normal.     ?   Thought Content: Thought content normal.     ?   Judgment: Judgment normal.  ?  ? ?Lab Results: ? ?CBC ?   ?Component Value Date/Time  ? WBC 7.0 09/12/2018 0604  ? RBC 4.85 09/12/2018 0604  ? HGB 14.6 09/12/2018 0604  ? HCT 45.9 09/12/2018 0604  ? PLT 195 09/12/2018 0604  ? MCV 94.6 09/12/2018 0604  ? MCH 30.1 09/12/2018 0604  ? MCHC 31.8 09/12/2018 0604  ? RDW 13.3 09/12/2018 0604  ? LYMPHSABS 1.7 08/22/2012 2023  ? MONOABS 0.7 08/22/2012 2023  ?  EOSABS 0.1 08/22/2012 2023  ? BASOSABS 0.0 08/22/2012 2023  ? ? ?BMET ?   ?Component Value Date/Time  ? NA 137 09/12/2018 0604  ? K 4.2 09/12/2018 0604  ? CL 106 09/12/2018 0604  ? CO2 22 09/12/2018 0604  ? GLUCOSE 198 (H) 09/12/2018 0604  ? BUN 12 09/12/2018 0604  ? CREATININE 1.17 09/12/2018 0604  ? CALCIUM 9.0 09/12/2018 0604  ? GFRNONAA >60 09/12/2018 0604  ? GFRAA >60 09/12/2018 0604  ? ? ?BNP ?No results found for: BNP ? ?ProBNP ?No results found for: PROBNP ? ?Imaging: ?DG Chest 2 View ? ?Result Date: 09/24/2021 ?CLINICAL DATA:  56 year old male with a history of shortness of breath EXAM: CHEST - 2 VIEW COMPARISON:  04/16/2020 FINDINGS: Cardiomediastinal silhouette unchanged in size and contour. No evidence of central vascular congestion. No interlobular septal thickening. No pneumothorax or pleural effusion. Coarsened interstitial markings, with no confluent airspace disease. No acute displaced fracture. Degenerative changes of the spine. IMPRESSION: No active cardiopulmonary disease. Electronically Signed   By: Corrie Mckusick D.O.   On: 09/24/2021 10:56   ? ? ?Assessment & Plan:  ? ?Dyspnea ?- Dyspnea started after using recalled [employer] CPAP machine 2018-2020.  He had no issues with his breathing prior to using CPAP.  Pulmonary function testing in 2021 showed mild restriction and increased diffusion capacity.  Restriction likely due to obesity.  Repeat PFTs in April 2023 showed improvement in FEV1.   Chest x-ray showed coarsened interstitial markings, no airspace disease.  He had a CT abdomen in 2013 that did show mild subpleural reticulation/dependent atelectasis at lung bases.  Recommend checking HRCT

## 2021-10-02 NOTE — Telephone Encounter (Signed)
The order was faxed on 4/13.  I called [employer] and spoke to [facility].  She is going to try to get scheduled for Monday.  ?

## 2021-10-02 NOTE — Telephone Encounter (Signed)
Needs ONO on CPAP scheduled, can we follow-up on this  ?

## 2021-10-02 NOTE — Patient Instructions (Addendum)
Pulmonary function testing showed mild restriction, overall lung function is mildly improved from 2021. Normal diffusion capacity ?CXR showed some coarsened interstitial markings, recommend getting CT chest to ensure no pulmonary fibrosis ? ?Orders: ?HRCT re: dyspnea  ? ?Follow-up: ?6 months with Beth NP for OSA / if CT abnormal we will call you to schedule sooner follow-up  ?

## 2021-10-02 NOTE — Assessment & Plan Note (Signed)
-   Dyspnea started after using recalled [employer] CPAP machine 2018-2020.  He had no issues with his breathing prior to using CPAP.  Pulmonary function testing in 2021 showed mild restriction and increased diffusion capacity.  Restriction likely due to obesity.  Repeat PFTs in April 2023 showed improvement in FEV1.  Chest x-ray showed coarsened interstitial markings, no airspace disease.  He had a CT abdomen in 2013 that did show mild subpleural reticulation/dependent atelectasis at lung bases.  Recommend checking HRCT to rule out ILD.  As these findings were present in 2013 and lung function has improved dyspnea symptoms unlikely related to recalled CPAP.  It would be difficult to prove mild restriction in lung capacity was due to CPAP use as we do not have baseline PFTs prior to him being started on CPAP.  If HRCT imaging comes back normal recommend patient follow-up with cardiology to assess dyspnea, he has a history of coronary artery disease along with risk factors such as obesity and hyperlipidemia. ?

## 2021-10-02 NOTE — Assessment & Plan Note (Signed)
-   Managed by Dr. Felecia Shelling, recommend checking ONO on CPAP to assess for nocturnal oxygen need d/t dyspnea symptoms  ?

## 2021-10-13 ENCOUNTER — Ambulatory Visit (INDEPENDENT_AMBULATORY_CARE_PROVIDER_SITE_OTHER)
Admission: RE | Admit: 2021-10-13 | Discharge: 2021-10-13 | Disposition: A | Discharge status: Home / Self Care | Payer: Medicare (Managed Care) | Source: Ambulatory Visit | Attending: Primary Care | Admitting: Primary Care

## 2021-10-13 DIAGNOSIS — R0609 Other forms of dyspnea: Secondary | ICD-10-CM | POA: Diagnosis not present

## 2021-10-13 DIAGNOSIS — R9389 Abnormal findings on diagnostic imaging of other specified body structures: Secondary | ICD-10-CM

## 2021-10-15 NOTE — Telephone Encounter (Signed)
Called and spoke with pt and have scheduled him an OV with BW 5/9. Nothing further needed. ?

## 2021-10-15 NOTE — Telephone Encounter (Signed)
Beth, please advise on the results of pt's recent CT. Thank you! ?

## 2021-10-15 NOTE — Telephone Encounter (Signed)
Pt is requesting ct scan results  ?

## 2021-10-16 ENCOUNTER — Encounter: Payer: Self-pay | Admitting: Cardiovascular Disease

## 2021-10-16 ENCOUNTER — Telehealth: Payer: Self-pay | Admitting: Primary Care

## 2021-10-16 DIAGNOSIS — R0600 Dyspnea, unspecified: Secondary | ICD-10-CM

## 2021-10-16 NOTE — Telephone Encounter (Signed)
Nothing to be concerned about. He had  mild subpleural reticulation/dependent atelectasis at lung bases back on CT abdomen imaging in 2013. This is not new. Incidental findings fatty liver and CAD. I would refer to cardiology for CAD to ensure no underlying cardiac causes of dyspnea

## 2021-10-16 NOTE — Telephone Encounter (Signed)
Please go ahead and order an echo for shortness of breath. ?

## 2021-10-19 NOTE — Telephone Encounter (Signed)
Tried calling the pt x 2 and his line was busy  ?Will call back ?

## 2021-10-20 ENCOUNTER — Encounter: Payer: Self-pay | Admitting: Primary Care

## 2021-10-20 ENCOUNTER — Ambulatory Visit (INDEPENDENT_AMBULATORY_CARE_PROVIDER_SITE_OTHER): Payer: Medicare (Managed Care) | Admitting: Primary Care

## 2021-10-20 VITALS — BP 128/80 | HR 79 | Temp 97.7°F | Ht 72.0 in | Wt 292.4 lb

## 2021-10-20 DIAGNOSIS — G4733 Obstructive sleep apnea (adult) (pediatric): Secondary | ICD-10-CM

## 2021-10-20 DIAGNOSIS — R0609 Other forms of dyspnea: Secondary | ICD-10-CM | POA: Diagnosis not present

## 2021-10-20 LAB — CBC WITH DIFFERENTIAL/PLATELET
Basophils Absolute: 0.1 10*3/uL (ref 0.0–0.1)
Basophils Relative: 0.9 % (ref 0.0–3.0)
Eosinophils Absolute: 0.1 10*3/uL (ref 0.0–0.7)
Eosinophils Relative: 2.2 % (ref 0.0–5.0)
HCT: 50.2 % (ref 39.0–52.0)
Hemoglobin: 16.6 g/dL (ref 13.0–17.0)
Lymphocytes Relative: 29.2 % (ref 12.0–46.0)
Lymphs Abs: 1.7 10*3/uL (ref 0.7–4.0)
MCHC: 33.1 g/dL (ref 30.0–36.0)
MCV: 98.3 fl (ref 78.0–100.0)
Monocytes Absolute: 0.4 10*3/uL (ref 0.1–1.0)
Monocytes Relative: 7 % (ref 3.0–12.0)
Neutro Abs: 3.5 10*3/uL (ref 1.4–7.7)
Neutrophils Relative %: 60.7 % (ref 43.0–77.0)
Platelets: 161 10*3/uL (ref 150.0–400.0)
RBC: 5.11 Mil/uL (ref 4.22–5.81)
RDW: 15.1 % (ref 11.5–15.5)
WBC: 5.8 10*3/uL (ref 4.0–10.5)

## 2021-10-20 LAB — COMPREHENSIVE METABOLIC PANEL
ALT: 40 U/L (ref 0–53)
AST: 25 U/L (ref 0–37)
Albumin: 4.2 g/dL (ref 3.5–5.2)
Alkaline Phosphatase: 67 U/L (ref 39–117)
BUN: 10 mg/dL (ref 6–23)
CO2: 23 mEq/L (ref 19–32)
Calcium: 9 mg/dL (ref 8.4–10.5)
Chloride: 107 mEq/L (ref 96–112)
Creatinine, Ser: 1.09 mg/dL (ref 0.40–1.50)
GFR: 75.37 mL/min (ref >60.00)
Glucose, Bld: 131 mg/dL — ABNORMAL HIGH (ref 70–99)
Potassium: 4.1 mEq/L (ref 3.5–5.1)
Sodium: 137 mEq/L (ref 135–145)
Total Bilirubin: 0.4 mg/dL (ref 0.2–1.2)
Total Protein: 7.1 g/dL (ref 6.0–8.3)

## 2021-10-20 LAB — BRAIN NATRIURETIC PEPTIDE: Pro B Natriuretic peptide (BNP): 14 pg/mL (ref 0.0–100.0)

## 2021-10-20 NOTE — Telephone Encounter (Signed)
Spoke with the pt  ?He says he can not remember the question that he had about his scan  ?He will discuss further at ov with BW today  ?Nothing further needed ?

## 2021-10-20 NOTE — Patient Instructions (Addendum)
No evidence of COPD/emphysema, lung infection or pulmonary fibrosis on testing. We will work up other causes of dyspnea ? ?Recommendations: ?Complete echocardiogram as scheduled  ? ?Orders: ?Labs today ? ?Follow-up: ?We will call you after echocardiogram has been completed/read with follow-up instructions  ? ? ?

## 2021-10-20 NOTE — Progress Notes (Signed)
$'@Patient'm$  ID: Stephen Franklin. Cohick, male    DOB: [birth year], 56 y.o.   MRN: [id number]  Chief Complaint  Patient presents with   Follow-up    Referring provider: Sherald Hess., MD  HPI: 56 year old male, former smoker quit in 2012 (27-pack-year history).  Past medical history significant for obstructive sleep apnea.  Former patient of Dr. Elsworth Soho, seen on 09/23/21 for sleep consult.   10/20/2021 Patient presents today for follow-up. He was originally seen on 09/23/21 for sleep consult. He was referred by Dr. Felecia Shelling, who manages his sleep apnea currently. He is here today for evaluation of his dyspnea symptoms which started in 2019 after using recalled philip's CPAP machine. He used this machine for approximately 3 years between 2018-2020. PFTs in 2021 showed mild restriction and increased diffusion capacity. Repeat function function testing today showed improvement in lung function compared to 2021. CXR showed coarsened interstitial markings, no airspace disease. He had a CT abd in 2013 did show mild subpleural reticulation/dependent atelectasis at lung bases. Recommend checking HRCT to rule out ILD. HRCT on 10/14/21 showed no evidence of fibrotic ILD, minimal bland appearing bandlike scarring at the lung bases. He is compliant with CPAP, managed by Dr. Felecia Shelling. Need ONO on CPAP to assess for oxygen need. He reports an episode of right shoulder pain with associated heart flutter Sunday. He has  been referred to cardiology for ongoing dyspnea.   Pulmonary function testing: 05/15/20 >> FVC 3.61 (71%), FEV1 2.98 (77%), ratio 83, TLC 92%, DLCO unc 34.08 (116%)  09/30/21 >> FVC 3.97 (78%), FEV1 3.27 (85%), ratio 82, TLC 96%, DLCOunc 33.69 (116%)   Allergies  Allergen Reactions   Epinephrine Shortness Of Breath    Shortness of breath and face swells   Naproxen Sodium Hives, Shortness Of Breath and Swelling   Bee Venom Itching    Immunization History  Administered Date(s) Administered   Influenza  Split 04/01/2017, 03/13/2018, 02/19/2019   Influenza,inj,Quad PF,6+ Mos 03/14/2018, 03/01/2019, 02/19/2020   Influenza,inj,Quad PF,6-35 Mos 03/01/2019   Influenza-Unspecified 03/13/2013   PFIZER Comirnaty(Gray Top)Covid-19 Tri-Sucrose Vaccine 08/24/2019, 09/14/2019, 11/27/2020   Pfizer Covid-19 Vaccine Bivalent Booster 6yr & up 03/18/2021   Pneumococcal Conjugate-13 03/21/2019   Pneumococcal Polysaccharide-23 03/14/2018   Td 06/14/2006   Tdap 08/25/2016    Past Medical History:  Diagnosis Date   Anxiety    Diabetes mellitus without complication (HCC)    Type 2   Dysrhythmia    palpitations   GERD (gastroesophageal reflux disease)    High cholesterol    History of kidney stones    Hypothyroidism    hx of correcte no meds now   Sleep apnea    cpap    Tobacco History: Social History   Tobacco Use  Smoking Status Former   Packs/day: 1.00   Years: 27.00   Total pack years: 27.00   Types: Cigarettes   Start date: 06/15/1983   Quit date: 06/21/2010   Years since quitting: 11.4  Smokeless Tobacco Former   Quit date: 06/13/2010   Counseling given: Not Answered   Outpatient Medications Prior to Visit  Medication Sig Dispense Refill   bisoprolol (ZEBETA) 5 MG tablet Take 1 tablet by mouth once daily 90 tablet 3   buPROPion (WELLBUTRIN XL) 150 MG 24 hr tablet Take 150 mg by mouth daily.     clonazePAM (KLONOPIN) 1 MG tablet Take 1 mg by mouth 3 (three) times daily as needed (for anxiety).      Dulaglutide (TRULICITY) 00.86MVH/8.4ON  SOPN Inject into the skin once a week.     fenofibrate 160 MG tablet Take 160 mg by mouth daily with breakfast.   3   omega-3 acid ethyl esters (LOVAZA) 1 g capsule Take 1 capsule (1 g total) by mouth 2 (two) times daily. 60 capsule 11   omeprazole (PRILOSEC) 40 MG capsule Take 40 mg by mouth daily before breakfast.      pravastatin (PRAVACHOL) 40 MG tablet Take 1 tablet (40 mg total) by mouth daily. 30 tablet 3   rOPINIRole (REQUIP) 1 MG tablet  Take 1 tablet (1 mg total) by mouth 3 (three) times daily. Must keep upcoming appt for continued refills 270 tablet 0   sertraline (ZOLOFT) 100 MG tablet Take 150 mg by mouth daily.      testosterone cypionate (DEPOTESTOSTERONE CYPIONATE) 200 MG/ML injection Inject 1 mL into the muscle every 21 ( twenty-one) days.      No facility-administered medications prior to visit.    Review of Systems  Review of Systems  Constitutional: Negative.   HENT: Negative.    Respiratory:  Positive for shortness of breath. Negative for cough and wheezing.   Cardiovascular:  Positive for palpitations.     Physical Exam  BP 128/80 (BP Location: Right Arm, Cuff Size: Large)   Pulse 79   Temp 97.7 F (36.5 C)   Ht 6' (1.829 m)   Wt 292 lb 6.4 oz (132.6 kg)   SpO2 98%   BMI 39.66 kg/m  Physical Exam Constitutional:      General: He is not in acute distress.    Appearance: Normal appearance. He is obese. He is not ill-appearing.  HENT:     Head: Normocephalic and atraumatic.  Cardiovascular:     Rate and Rhythm: Normal rate and regular rhythm.     Comments: RRR Pulmonary:     Effort: Pulmonary effort is normal.     Breath sounds: Normal breath sounds.     Comments: Clear  Musculoskeletal:        General: Normal range of motion.     Cervical back: Normal range of motion and neck supple.  Skin:    General: Skin is warm and dry.  Neurological:     General: No focal deficit present.     Mental Status: He is alert and oriented to person, place, and time. Mental status is at baseline.  Psychiatric:        Mood and Affect: Mood normal.        Behavior: Behavior normal.        Thought Content: Thought content normal.        Judgment: Judgment normal.      Lab Results:  CBC    Component Value Date/Time   WBC 5.8 10/20/2021 1510   RBC 5.11 10/20/2021 1510   HGB 16.6 10/20/2021 1510   HCT 50.2 10/20/2021 1510   PLT 161.0 10/20/2021 1510   MCV 98.3 10/20/2021 1510   MCH 30.1 09/12/2018  0604   MCHC 33.1 10/20/2021 1510   RDW 15.1 10/20/2021 1510   LYMPHSABS 1.7 10/20/2021 1510   MONOABS 0.4 10/20/2021 1510   EOSABS 0.1 10/20/2021 1510   BASOSABS 0.1 10/20/2021 1510    BMET    Component Value Date/Time   NA 137 10/20/2021 1510   K 4.1 10/20/2021 1510   CL 107 10/20/2021 1510   CO2 23 10/20/2021 1510   GLUCOSE 131 (H) 10/20/2021 1510   BUN 10 10/20/2021 1510   CREATININE  1.09 10/20/2021 1510   CALCIUM 9.0 10/20/2021 1510   GFRNONAA >60 09/12/2018 0604   GFRAA >60 09/12/2018 0604    BNP No results found for: "BNP"  ProBNP    Component Value Date/Time   PROBNP 14.0 10/20/2021 1510    Imaging: ECHOCARDIOGRAM COMPLETE  Result Date: 10/28/2021    ECHOCARDIOGRAM REPORT   Patient Name:   Stephen Franklin Date of Exam: 10/28/2021 Medical Rec #:  [id number]         Height:       72.0 in Accession #:    [id number]        Weight:       292.4 lb Date of Birth:  [birth year]        BSA:          2.504 m Patient Age:    48 years          BP:           128/80 mmHg Patient Gender: M                 HR:           75 bpm. Exam Location:  [facility] Procedure: 2D Echo, Cardiac Doppler and Color Doppler Indications:    R06.00 SOB  History:        Patient has prior history of Echocardiogram examinations, most                 recent 01/25/2013. Signs/Symptoms:Shortness of Breath; Risk                 Factors:Former Smoker, Dyslipidemia and Obesity.  Sonographer:    Coralyn Helling RDCS Referring Phys: [phone] [place]  Sonographer Comments: Patient is morbidly obese. IMPRESSIONS  1. Left ventricular ejection fraction, by estimation, is 55 to 60%. The left ventricle has normal function. The left ventricle has no regional wall motion abnormalities. Left ventricular diastolic parameters were normal.  2. Right ventricular systolic function is normal. The right ventricular size is normal. Tricuspid regurgitation signal is inadequate for assessing PA pressure.  3. The mitral valve is  normal in structure. Trivial mitral valve regurgitation. No evidence of mitral stenosis.  4. The aortic valve is normal in structure. Aortic valve regurgitation is not visualized. No aortic stenosis is present.  5. There is mild dilatation of the aortic root, measuring 38 mm.  6. The inferior vena cava is normal in size with greater than 50% respiratory variability, suggesting right atrial pressure of 3 mmHg. FINDINGS  Left Ventricle: Left ventricular ejection fraction, by estimation, is 55 to 60%. The left ventricle has normal function. The left ventricle has no regional wall motion abnormalities. The left ventricular internal cavity size was normal in size. There is  no left ventricular hypertrophy. Left ventricular diastolic parameters were normal. Normal left ventricular filling pressure. Right Ventricle: The right ventricular size is normal. No increase in right ventricular wall thickness. Right ventricular systolic function is normal. Tricuspid regurgitation signal is inadequate for assessing PA pressure. Left Atrium: Left atrial size was normal in size. Right Atrium: Right atrial size was normal in size. Pericardium: There is no evidence of pericardial effusion. Mitral Valve: The mitral valve is normal in structure. Trivial mitral valve regurgitation. No evidence of mitral valve stenosis. Tricuspid Valve: The tricuspid valve is normal in structure. Tricuspid valve regurgitation is not demonstrated. No evidence of tricuspid stenosis. Aortic Valve: The aortic valve is normal in structure. Aortic valve regurgitation is not visualized. No  aortic stenosis is present. Pulmonic Valve: The pulmonic valve was normal in structure. Pulmonic valve regurgitation is trivial. No evidence of pulmonic stenosis. Aorta: The aortic root is normal in size and structure. There is mild dilatation of the aortic root, measuring 38 mm. Venous: The inferior vena cava is normal in size with greater than 50% respiratory variability,  suggesting right atrial pressure of 3 mmHg. IAS/Shunts: No atrial level shunt detected by color flow Doppler.  LEFT VENTRICLE PLAX 2D LVIDd:         4.90 cm   Diastology LVIDs:         3.40 cm   LV e' medial:    8.70 cm/s LV PW:         0.90 cm   LV E/e' medial:  10.1 LV IVS:        1.00 cm   LV e' lateral:   11.30 cm/s LVOT diam:     2.40 cm   LV E/e' lateral: 7.8 LV SV:         84 LV SV Index:   34 LVOT Area:     4.52 cm  RIGHT VENTRICLE             IVC RV S prime:     14.80 cm/s  IVC diam: 1.30 cm TAPSE (M-mode): 2.3 cm LEFT ATRIUM           Index        RIGHT ATRIUM           Index LA diam:      4.70 cm 1.88 cm/m   RA Pressure: 3.00 mmHg LA Vol (A2C): 46.5 ml 18.57 ml/m  RA Area:     17.00 cm LA Vol (A4C): 77.6 ml 30.99 ml/m  RA Volume:   45.60 ml  18.21 ml/m  AORTIC VALVE LVOT Vmax:   88.60 cm/s LVOT Vmean:  64.000 cm/s LVOT VTI:    0.186 m  AORTA Ao Root diam: 3.80 cm Ao Asc diam:  3.20 cm MITRAL VALVE               TRICUSPID VALVE MV Area (PHT): 3.95 cm    Estimated RAP:  3.00 mmHg MV Decel Time: 192 msec MV E velocity: 88.00 cm/s  SHUNTS MV A velocity: 73.40 cm/s  Systemic VTI:  0.19 m MV E/A ratio:  1.20        Systemic Diam: 2.40 cm Fransico Him MD Electronically signed by Fransico Him MD Signature Date/Time: 10/28/2021/8:10:36 AM    Final      Assessment & Plan:   Dyspnea - Dyspnea started after using recalled Phillips CPAP machine between 2018-2020. He had no issues with his breathing prior to using CPAP. He had PFTs in 2021 that showed mild restriction and increased diffusion capacity. Restriction likely d/t obesity. Repeat pulmonary function testing in April 2023 showed improved FEV1. HRCT without evidence of ILD, minimal bland appearing bandlike scarring at lung bases which clear to have been present back on CT abdomen in 2013. No evidence that use of phillips recalled CPAP machine has caused any damage to his lungs. Recommend he follow-up with cardiology for ongoing dyspnea. Encourage  weight loss and increasing physical activity.   Obstructive sleep apnea - Managed by Dr. Felecia Shelling - Checking ONO on CPAP to assess for oxygen need at night     Martyn Ehrich, NP 11/22/2021

## 2021-10-20 NOTE — Progress Notes (Signed)
Labs were normal except for blood glucose which was 131, follow up with PCP.

## 2021-10-28 ENCOUNTER — Ambulatory Visit (HOSPITAL_COMMUNITY): Payer: Medicare (Managed Care) | Attending: Cardiology

## 2021-10-28 DIAGNOSIS — R0600 Dyspnea, unspecified: Secondary | ICD-10-CM

## 2021-10-28 LAB — ECHOCARDIOGRAM COMPLETE
Area-P 1/2: 3.95 cm2
S' Lateral: 3.4 cm

## 2021-11-03 ENCOUNTER — Encounter: Payer: Self-pay | Admitting: Neurology

## 2021-11-06 ENCOUNTER — Telehealth: Payer: Self-pay | Admitting: Primary Care

## 2021-11-06 NOTE — Telephone Encounter (Signed)
ONO 10/14/21 on CPAP showed patient spent 1 min with SpO2<88%. SpO2  Low 85%, basal SpO2 93%. He does not need supplemental oxygen

## 2021-11-10 ENCOUNTER — Telehealth: Payer: Self-pay | Admitting: Neurology

## 2021-11-10 NOTE — Telephone Encounter (Signed)
LVM and sent mychart msg informing pt of r/s needed for 6/29 appt- MD out.

## 2021-11-11 ENCOUNTER — Ambulatory Visit: Payer: Medicare (Managed Care) | Admitting: Pulmonary Disease

## 2021-11-11 NOTE — Telephone Encounter (Signed)
Called and spoke with pt letting him know the results of ONO and he verbalized understanding. Nothing further needed. 

## 2021-11-22 NOTE — Assessment & Plan Note (Signed)
-   Managed by Dr. Felecia Shelling - Checking ONO on CPAP to assess for oxygen need at night

## 2021-11-22 NOTE — Assessment & Plan Note (Signed)
-   Dyspnea started after using recalled Phillips CPAP machine between 2018-2020. He had no issues with his breathing prior to using CPAP. He had PFTs in 2021 that showed mild restriction and increased diffusion capacity. Restriction likely d/t obesity. Repeat pulmonary function testing in April 2023 showed improved FEV1. HRCT without evidence of ILD, minimal bland appearing bandlike scarring at lung bases which clear to have been present back on CT abdomen in 2013. No evidence that use of phillips recalled CPAP machine has caused any damage to his lungs. Recommend he follow-up with cardiology for ongoing dyspnea. Encourage weight loss and increasing physical activity.

## 2021-12-09 ENCOUNTER — Encounter: Payer: Self-pay | Admitting: Neurology

## 2021-12-09 ENCOUNTER — Ambulatory Visit (INDEPENDENT_AMBULATORY_CARE_PROVIDER_SITE_OTHER): Payer: Medicare (Managed Care) | Admitting: Neurology

## 2021-12-09 VITALS — BP 122/78 | HR 89 | Ht 72.0 in | Wt 287.5 lb

## 2021-12-09 DIAGNOSIS — G4733 Obstructive sleep apnea (adult) (pediatric): Secondary | ICD-10-CM | POA: Diagnosis not present

## 2021-12-09 DIAGNOSIS — R208 Other disturbances of skin sensation: Secondary | ICD-10-CM | POA: Diagnosis not present

## 2021-12-09 DIAGNOSIS — E1151 Type 2 diabetes mellitus with diabetic peripheral angiopathy without gangrene: Secondary | ICD-10-CM

## 2021-12-09 DIAGNOSIS — G4719 Other hypersomnia: Secondary | ICD-10-CM | POA: Diagnosis not present

## 2021-12-09 NOTE — Progress Notes (Signed)
[facility]  PATIENT: Stephen Franklin. [name] DOB: 15-May-1966  REFERRING DOCTOR OR PCP:  Starlyn Skeans, PA-C SOURCE: Patient, notes from [facility], downloads from the CPAP.  _________________________________   HISTORICAL  CHIEF COMPLAINT:  Chief Complaint  Patient presents with   Obstructive Sleep Apnea    Rm 2, alone. Pt here for yearly CPAP f/u. Has been doing well on CPAP. Pt feels out of breath more then normal. BP has been fine.     HISTORY OF PRESENT ILLNESS:  Stephen Franklin is a 56 yo man with obstructive sleep apnea and memory difficulty  Update 12/09/2021 He got a new CPAP machine after the recall around Sept/Oct 2021 [facility])  On the old machine he ws having a lot of congestion and feels that is better.   He is on CPAP +15 cm.  He  is able to use it on a nightly basis for the whole night.  He feels better the next day when he uses CPAP for the entire night but he has .  The download shows excellent compliance (100% > 4 hrs and 100% night).  AHI = 4.8Armodafinil is helping the sleepiness.    He feels his memory is better, back to baseline.   He denies any excessive daytime sleepiness at this time.  He notes more sleepiness during the day and has reduced focus at times.      RLS is well controlled on ropinirole.   No side effects.  He also takes ropinirole 1 in am and 2 at night.    He has lost 25 pounds.    Weight is now stable.   HgBA1 was 6.2 mos recently.  He tries to eat better.    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   Numbness and pain is only in the toes/foot, right > left.   Pregabalin 75 mg helps some.   He takes clonazepam just 1-2 times a week and has not taken hydrocodone recently.    EPWORTH SLEEPINESS SCALE  On a scale of 0 - 3 what is the chance of dozing:  Sitting and Reading:   0 Watching TV:    1 Sitting inactive in a public place: 2 Passenger in car for one hour: 1  Lying down to rest in  the afternoon: 3 Sitting and talking to someone: 0 Sitting quietly after lunch:  1 In a car, stopped in traffic:  0  Total (out of 24):     8/24    Was 10/24 before CPAP  He is on disability.  He tries to stay physically and mentally fit.       Sleep history He was diagnosed with OSA in 2012 after presenting with snoring and EDS.   His first one was done at [facility] and he recalls an AHI = 24.   He was titrated to CPAP +14 cm.   He has excellent compliance (100%) and efficacy (30 day AHI = 2.4 today).     He feels much better the next day when he uses CPAP.   He got a new machine (Dreamstation) in 2021     REVIEW OF SYSTEMS: Constitutional: No fevers, chills, sweats, or change in appetite.   Mild insomnia/RLS better on CPAP Eyes: No visual changes, double vision, eye pain Ear, nose and throat: No hearing loss, ear pain, nasal congestion, sore throat Cardiovascular: No chest pain, palpitations Respiratory:  No shortness of breath at rest or with exertion.  OSA and occ coughing GastrointestinaI: No nausea, vomiting, diarrhea, abdominal pain, fecal incontinence Genitourinary:  No dysuria, urinary retention or frequency.  No nocturia. Musculoskeletal:  has some LBP and myalgias Integumentary: No rash, pruritus, skin lesions Neurological: as above Psychiatric: No depression at this time.  No anxiety Endocrine: No palpitations, diaphoresis, change in appetite, change in weigh or increased thirst Hematologic/Lymphatic:  No anemia, purpura, petechiae. Allergic/Immunologic: No itchy/runny eyes, nasal congestion, recent allergic reactions, rashes  ALLERGIES: Allergies  Allergen Reactions   Epinephrine Shortness Of Breath    Shortness of breath and face swells   Naproxen Sodium Hives, Shortness Of Breath and Swelling   Bee Venom Itching    HOME MEDICATIONS:  Current Outpatient Medications:    bisoprolol (ZEBETA) 5 MG tablet, Take 1 tablet by mouth once daily, Disp: 90  tablet, Rfl: 3   buPROPion (WELLBUTRIN XL) 150 MG 24 hr tablet, Take 150 mg by mouth daily., Disp: , Rfl:    clonazePAM (KLONOPIN) 1 MG tablet, Take 1 mg by mouth 3 (three) times daily as needed (for anxiety). , Disp: , Rfl:    Dulaglutide (TRULICITY) 4.25 ZD/6.3OV SOPN, Inject into the skin once a week., Disp: , Rfl:    fenofibrate 160 MG tablet, Take 160 mg by mouth daily with breakfast. , Disp: , Rfl: 3   omega-3 acid ethyl esters (LOVAZA) 1 g capsule, Take 1 capsule (1 g total) by mouth 2 (two) times daily., Disp: 60 capsule, Rfl: 11   omeprazole (PRILOSEC) 40 MG capsule, Take 40 mg by mouth daily before breakfast. , Disp: , Rfl:    pravastatin (PRAVACHOL) 40 MG tablet, Take 1 tablet (40 mg total) by mouth daily., Disp: 30 tablet, Rfl: 3   rOPINIRole (REQUIP) 1 MG tablet, Take 1 tablet (1 mg total) by mouth 3 (three) times daily. Must keep upcoming appt for continued refills, Disp: 270 tablet, Rfl: 0   sertraline (ZOLOFT) 100 MG tablet, Take 150 mg by mouth daily. , Disp: , Rfl:    testosterone cypionate (DEPOTESTOSTERONE CYPIONATE) 200 MG/ML injection, Inject 1 mL into the muscle every 21 ( twenty-one) days. , Disp: , Rfl:   PAST MEDICAL HISTORY: Past Medical History:  Diagnosis Date   Anxiety    Diabetes mellitus without complication (HCC)    Type 2   Dysrhythmia    palpitations   GERD (gastroesophageal reflux disease)    High cholesterol    History of kidney stones    Hypothyroidism    hx of correcte no meds now   Sleep apnea    cpap    PAST SURGICAL HISTORY: Past Surgical History:  Procedure Laterality Date   COLONOSCOPY     HERNIA REPAIR     Left inguinal, umbilical with mesh 5-64-33 Dr. Redmond Pulling   LEFT HEART CATH AND CORONARY ANGIOGRAPHY N/A 09/12/2018   Procedure: LEFT HEART CATH AND CORONARY ANGIOGRAPHY;  Surgeon: Martinique, Peter M, MD;  Location: [place] [facility];  Service: Cardiovascular;  Laterality: N/A;   UMBILICAL HERNIA REPAIR N/A 06/23/2017   Procedure:  LAPAROSCOPIC ASSISTED UMBILICAL HERNIA REPAIR WITH MESH;  Surgeon: Greer Pickerel, MD;  Location: [facility];  Service: General;  Laterality: N/A;    FAMILY HISTORY: Family History  Adopted: Yes  Problem Relation Age of Onset   Heart failure Mother    Breast cancer Mother    Diabetes Mother    Lung cancer Father     SOCIAL HISTORY:  Social History   Socioeconomic History   Marital status: Married  Spouse name: Not on file   Number of children: Not on file   Years of education: Not on file   Highest education level: Not on file  Occupational History   Not on file  Tobacco Use   Smoking status: Former    Packs/day: 1.00    Years: 27.00    Total pack years: 27.00    Types: Cigarettes    Start date: 06/15/1983    Quit date: 06/21/2010    Years since quitting: 11.4   Smokeless tobacco: Former    Quit date: 06/13/2010  Vaping Use   Vaping Use: Never used  Substance and Sexual Activity   Alcohol use: No    Comment: hx of none in 7-8 years   Drug use: No   Sexual activity: Never  Other Topics Concern   Not on file  Social History Narrative   Not on file   Social Determinants of Health   Financial Resource Strain: Not on file  Food Insecurity: Not on file  Transportation Needs: Not on file  Physical Activity: Not on file  Stress: Not on file  Social Connections: Not on file  Intimate Partner Violence: Not on file     PHYSICAL EXAM  Vitals:   12/09/21 1432  BP: 122/78  Pulse: 89  Weight: 287 lb 8 oz (130.4 kg)  Height: 6' (1.829 m)    Body mass index is 38.99 kg/m.   General: The patient is an obese man in no acute distress   Neurologic Exam  Mental status: The patient is alert and oriented x 3 at the time of the examination.  Speech is normal.  He appears to have normal short-term memory and focus and attention.  Cranial nerves: Extraocular movements are full.  Facial strength and sensation was normal.  The trapezius strength is normal.   No obvious  hearing deficits are noted.  Motor:  Muscle bulk is normal.   Tone is normal. Strength is  5 / 5 in all 4 extremities.   Sensory:  Reduced vibration sensation in toes only.   Normal pp and normal in arms.      Coordination: Cerebellar testing reveals good finger-nose-finger  Gait and station: Station is normal.  His gait and tandem gait are normal.  Reflexes: Deep tendon reflexes are symmetric and normal bilaterally.        Obstructive sleep apnea  Excessive daytime sleepiness  Type 2 diabetes mellitus with diabetic peripheral angiopathy without gangrene, without long-term current use of insulin (HCC)  Dysesthesia   1.   Continue CPAP at +15 cm H2O.   Supplies as needed.  We will try to get the download..   2.   Continue ropinirole for restless leg syndrome.  Continue other medications. - he will discuss higher pregabalin dose with his PCP at his next visit.   3.   He will return to see [place] in 1 year or sooner if there are new or worsening neurologic symptoms.  Tiena Manansala A. Felecia Shelling, MD, PhD, FAAN Certified in Neurology, Clinical Neurophysiology, Sleep Medicine, Pain Medicine and Neuroimaging Director, [place] at [facility] [address], [place], [place] [address] [phone]

## 2021-12-10 ENCOUNTER — Ambulatory Visit: Payer: Medicare (Managed Care) | Admitting: Neurology

## 2022-01-25 ENCOUNTER — Other Ambulatory Visit: Payer: Self-pay | Admitting: Cardiovascular Disease

## 2022-01-26 ENCOUNTER — Other Ambulatory Visit: Payer: Self-pay | Admitting: Cardiovascular Disease

## 2022-03-11 ENCOUNTER — Other Ambulatory Visit: Payer: Self-pay | Admitting: Cardiovascular Disease

## 2022-04-14 ENCOUNTER — Telehealth: Payer: Self-pay | Admitting: Cardiovascular Disease

## 2022-04-14 NOTE — Telephone Encounter (Signed)
*  STAT* If patient is at the pharmacy, call can be transferred to refill team.   1. Which medications need to be refilled? (please list name of each medication and dose if known) omega-3 acid ethyl esters (LOVAZA) 1 g capsule  2. Which pharmacy/location (including street and city if local pharmacy) is medication to be sent to? [facility] - [place], [place] - [address]  3. Do they need a 30 day or 90 day supply? [place]

## 2022-04-15 MED ORDER — OMEGA-3-ACID ETHYL ESTERS 1 G PO CAPS: 1.0000 g | ORAL_CAPSULE | Freq: Two times a day (BID) | ORAL | 0 refills | Status: DC

## 2022-04-15 NOTE — Telephone Encounter (Signed)
Please see below.

## 2022-04-18 ENCOUNTER — Other Ambulatory Visit: Payer: Self-pay | Admitting: Cardiovascular Disease

## 2022-04-20 ENCOUNTER — Telehealth: Payer: Self-pay | Admitting: Neurology

## 2022-04-20 MED ORDER — ROPINIROLE HCL 1 MG PO TABS: 1.0000 mg | ORAL_TABLET | Freq: Three times a day (TID) | ORAL | 0 refills | Status: DC

## 2022-04-20 NOTE — Telephone Encounter (Signed)
Pt is calling. Requesting a refill on medication rOPINIRole (REQUIP) 1 MG tablet. Refill should be sent to [facility]

## 2022-05-13 ENCOUNTER — Ambulatory Visit: Payer: Medicare (Managed Care) | Admitting: Cardiovascular Disease

## 2022-05-14 ENCOUNTER — Other Ambulatory Visit: Payer: Self-pay | Admitting: Cardiovascular Disease

## 2022-06-15 ENCOUNTER — Other Ambulatory Visit: Payer: Self-pay | Admitting: Cardiovascular Disease

## 2022-06-23 ENCOUNTER — Other Ambulatory Visit: Payer: Self-pay | Admitting: Cardiovascular Disease

## 2022-07-05 ENCOUNTER — Other Ambulatory Visit: Payer: Self-pay | Admitting: Neurology

## 2022-07-17 ENCOUNTER — Other Ambulatory Visit: Payer: Self-pay | Admitting: Cardiovascular Disease

## 2022-07-25 ENCOUNTER — Other Ambulatory Visit (HOSPITAL_BASED_OUTPATIENT_CLINIC_OR_DEPARTMENT_OTHER): Payer: Self-pay

## 2022-07-25 MED ORDER — SILDENAFIL CITRATE 50 MG PO TABS
ORAL_TABLET | ORAL | 1 refills | Status: DC
  Filled 2022-07-25: qty 30, 60d supply, fill #0

## 2022-07-26 ENCOUNTER — Other Ambulatory Visit (HOSPITAL_BASED_OUTPATIENT_CLINIC_OR_DEPARTMENT_OTHER): Payer: Self-pay

## 2022-08-01 ENCOUNTER — Other Ambulatory Visit: Payer: Self-pay | Admitting: Cardiovascular Disease

## 2022-08-26 ENCOUNTER — Telehealth: Payer: Self-pay | Admitting: Cardiovascular Disease

## 2022-08-26 MED ORDER — PRAVASTATIN SODIUM 40 MG PO TABS: 40.0000 mg | ORAL_TABLET | Freq: Every day | ORAL | 0 refills | Status: DC

## 2022-08-26 NOTE — Telephone Encounter (Signed)
*  STAT* If patient is at the pharmacy, call can be transferred to refill team.   1. Which medications need to be refilled? (please list name of each medication and dose if known)  pravastatin (PRAVACHOL) 40 MG tablet  2. Which pharmacy/location (including street and city if local pharmacy) is medication to be sent to? [facility] - [place], [place] - [address]  3. Do they need a 30 day or 90 day supply?  90 day supply  Patient is completely out of medication.

## 2022-08-26 NOTE — Telephone Encounter (Signed)
Gave patient a 7 day supply until office visit on 08/30/22.

## 2022-08-29 NOTE — Progress Notes (Signed)
 " Cardiology Office Note:    Date:  08/30/2022   ID:  Stephen Franklin, Stephen Franklin [birth year], MRN [id number]  PCP:  Jerrye Lamar CHRISTELLA Mickey., MD   [facility] Providers Cardiologist:  Jerel Balding, MD     Referring MD: Jerrye Lamar CHRISTELLA Mickey., MD   CC: follow up for CAD  History of Present Illness:    Stephen Franklin is a 57 y.o. male with a hx of OSA on CPAP, hyperthyroidism, DM, palpitations, hypertriglyceridemia, hypertension, GERD, PVCs, CAD (nonobstructive per LHC in 2020).  Stephen Franklin initially establish care with Dr. Balding in 2014 for complaints of heart palpitations, shortness of breath.  He underwent echocardiogram which was largely normal, without evidence of pulmonary hypertension.   In 2020 he had been working in his yard and experienced severe retrosternal chest tightness, with a burning sensation of the left side of his neck. He had a left heart cath on 09/12/2018 which showed mild nonobstructive CAD in the first diagonal.  He wore a monitor in 2020 which predominantly showed normal sinus rhythm with frequent PVCs, the PVCs had the same morphology, ultimately his post propofol  was increased to 10 mg daily.  Most recently he was evaluated by Dr. Balding on 12/25/2020, at that time he had continued episodes of chest tightness that lasted for around 10 to 30 seconds, occasionally accompanied by mild dyspnea and lightheadedness.   An echocardiogram was ordered in May of 2023 for persistent dyspnea which showed an EF of 55 to 60%, trivial MR, mild dilatation of the aortic root at 38 mm.  He presents today for follow-up of his CAD.  He endorses that he does not feel well in general however, this has been persistent for several years.  He is able to be active, recently built a fence without any symptoms.  He frequently walks with his wife.  He does voice some concerns on whether he needs to have a repeat catheterization, he felt like maybe this was something that would be done  routinely.  He endorses palpitations, they are less noticeable since he has been on bisoprolol . At times, he has noticed a sensation of his heart racing, he has the urge to cough and this terminates the sensation. He denies chest pain, dyspnea, pnd, orthopnea, n, v, dizziness, syncope, edema, weight gain, or early satiety.   Past Medical History:  Diagnosis Date   Anxiety    Diabetes mellitus without complication (HCC)    Type 2   Dysrhythmia    palpitations   GERD (gastroesophageal reflux disease)    High cholesterol    History of kidney stones    Hypothyroidism    hx of correcte no meds now   Sleep apnea    cpap    Past Surgical History:  Procedure Laterality Date   COLONOSCOPY     HERNIA REPAIR     Left inguinal, umbilical with mesh 06-23-17 Dr. Tanda   LEFT HEART CATH AND CORONARY ANGIOGRAPHY N/A 09/12/2018   Procedure: LEFT HEART CATH AND CORONARY ANGIOGRAPHY;  Surgeon: Jordan, Peter M, MD;  Location: [facility] INVASIVE CV LAB;  Service: Cardiovascular;  Laterality: N/A;   UMBILICAL HERNIA REPAIR N/A 06/23/2017   Procedure: LAPAROSCOPIC ASSISTED UMBILICAL HERNIA REPAIR WITH MESH;  Surgeon: Tanda Locus, MD;  Location: [facility];  Service: General;  Laterality: N/A;    Current Medications: Current Meds  Medication Sig   Armodafinil  200 MG TABS Take 200 mg by mouth daily in the afternoon.   bisoprolol  (ZEBETA )  5 MG tablet Take 1 tablet by mouth once daily   Blood Glucose Monitoring Suppl (TGT BLOOD GLUCOSE MONITORING) w/Device KIT Inject into the skin daily in the afternoon.   buPROPion  (WELLBUTRIN  XL) 150 MG 24 hr tablet Take 150 mg by mouth daily.   Dulaglutide (TRULICITY) 0.75 MG/0.5ML SOPN Inject into the skin once a week.   fenofibrate 160 MG tablet Take 160 mg by mouth daily with breakfast.    glucose blood (CONTOUR TEST) test strip 1 each by Other route as needed.   JARDIANCE 10 MG TABS tablet Take 1 tablet by mouth daily.   omega-3 acid ethyl esters (LOVAZA ) 1 g capsule Take 1  capsule (1 g total) by mouth 2 (two) times daily. Patient must schedule appointment for future refills second attempt   omeprazole (PRILOSEC) 40 MG capsule Take 40 mg by mouth daily before breakfast.    pravastatin  (PRAVACHOL ) 40 MG tablet Take 1 tablet (40 mg total) by mouth daily. KEEP OV FOR 90 DAY SUPPLY.   pregabalin (LYRICA) 75 MG capsule Take 75 mg by mouth 2 (two) times daily. Two tablets twice daily   rOPINIRole  (REQUIP ) 1 MG tablet TAKE 1 TABLET BY MOUTH THREE TIMES DAILY . APPOINTMENT REQUIRED FOR FUTURE REFILLS   sertraline  (ZOLOFT ) 100 MG tablet Take 150 mg by mouth daily.    sildenafil  (VIAGRA ) 50 MG tablet Take 1 tablet by mouth as needed an hour before sex. Do not take more than 1 time in 48 hours. This is not a daily medicine.   testosterone  cypionate (DEPOTESTOSTERONE CYPIONATE) 200 MG/ML injection Inject 1 mL into the muscle every 21 ( twenty-one) days.      Allergies:   Epinephrine , Naproxen sodium, and Bee venom   Social History   Socioeconomic History   Marital status: Married    Spouse name: Not on file   Number of children: Not on file   Years of education: Not on file   Highest education level: Not on file  Occupational History   Not on file  Tobacco Use   Smoking status: Former    Packs/day: 1.00    Years: 27.00    Additional pack years: 0.00    Total pack years: 27.00    Types: Cigarettes    Start date: 06/15/1983    Quit date: 06/21/2010    Years since quitting: 12.2   Smokeless tobacco: Former    Quit date: 06/13/2010  Vaping Use   Vaping Use: Never used  Substance and Sexual Activity   Alcohol use: No    Comment: hx of none in 7-8 years   Drug use: No   Sexual activity: Never  Other Topics Concern   Not on file  Social History Narrative   Not on file   Social Determinants of Health   Financial Resource Strain: Not on file  Food Insecurity: Not on file  Transportation Needs: Not on file  Physical Activity: Not on file  Stress: Not on file   Social Connections: Not on file     Family History: The patient's family history includes Breast cancer in his mother; Diabetes in his mother; Heart failure in his mother; Lung cancer in his father. He was adopted.  ROS:   Please see the history of present illness.     All other systems reviewed and are negative.  EKGs/Labs/Other Studies Reviewed:    The following studies were reviewed today:  10/28/2021 echo complete - EF 55 to 60%, trivial MR, mild dilatation of the  aortic root at 38 mm.  11/09/2018 long-term monitor -  The baseline rhythm is normal sinus rhythm with normal circadian variation. There are very frequent PVCs. They make up 13% of all recorded beats and commonly occur in a pattern of quadrigeminy, less often bigeminy. All the PVCs appear to have the same morphology. A single ventricular couplet is seen, but there is no ventricular tachycardia. The patient's symptoms are correlated to periods of increased PVC frequency.   Abnormal event monitor due to symptomatic and very frequent monophasic PVCs. Complex ventricular arrhythmia is not seen and there is no significant atrial arrhythmia  09/12/2018 left heart cath -  Ost 1st Diag to 1st Diag lesion is 45% stenosed. The left ventricular systolic function is normal. LV end diastolic pressure is normal. The left ventricular ejection fraction is 55-65% by visual estimate.   1. Nonobstructive CAD- mild 2. Normal LV function 3. Normal LVEDP   Plan: continue risk factor modification.  EKG:  EKG is  ordered today.  The ekg ordered today demonstrates NSR, HR 79 bpm, consistent with prior EKG tracings.   Recent Labs: 10/20/2021: ALT 40; BUN 10; Creatinine, Ser 1.09; Hemoglobin 16.6; Platelets 161.0; Potassium 4.1; Pro B Natriuretic peptide (BNP) 14.0; Sodium 137  Recent Lipid Panel    Component Value Date/Time   CHOL 174 01/02/2010 1237   TRIG 298.0 (H) 01/02/2010 1237   HDL 30.80 (L) 01/02/2010 1237   CHOLHDL 6  01/02/2010 1237   VLDL 59.6 (H) 01/02/2010 1237   LDLDIRECT 95.1 01/02/2010 1237     Risk Assessment/Calculations:                Physical Exam:    VS:  BP 112/70 (BP Location: Left Arm, Patient Position: Sitting, Cuff Size: Large)   Pulse 79   Ht 6' (1.829 m)   Wt 293 lb 6.4 oz (133.1 kg)   SpO2 95%   BMI 39.79 kg/m     Wt Readings from Last 3 Encounters:  08/30/22 293 lb 6.4 oz (133.1 kg)  12/09/21 287 lb 8 oz (130.4 kg)  10/20/21 292 lb 6.4 oz (132.6 kg)     GEN:  Well nourished, well developed in no acute distress HEENT: Normal NECK: No JVD; No carotid bruits LYMPHATICS: No lymphadenopathy CARDIAC: RRR, no murmurs, rubs, gallops RESPIRATORY:  Clear to auscultation without rales, wheezing or rhonchi  ABDOMEN: Soft, non-tender, non-distended MUSCULOSKELETAL:  No edema; No deformity  SKIN: Warm and dry NEUROLOGIC:  Alert and oriented x 3 PSYCHIATRIC:  Normal affect   ASSESSMENT:    1. Coronary artery disease involving native coronary artery of native heart with angina pectoris (HCC)   2. Essential hypertension   3. Palpitations   4. OSA (obstructive sleep apnea)    PLAN:    In order of problems listed above:  Coronary artery disease -nonobstructive CAD on LHC in 2020. Stable with no anginal symptoms. No indication for ischemic evaluation. He reports he does not feel well in general--for a few years, able to work in his yard and exercise without incident. Curious if his bisoprolol  could be causing some of his symptoms, however he is ok to continue for now. Continue bisoprolol , Lovaza , Pravachol , ASA.  Hypertension -blood pressure today is well-controlled at 112/70, continue current antihypertensive medication regimen.  Palpitations -he continues to notice palpitations however they are better controlled than previously in the past.  He does endorse at times he feels his heart is racing, he has the urge to cough and when  he does this sensation subsides.  We  discussed repeating a ZIO monitor however at this time he does not want to pursue this.  OSA -compliant with CPAP.  Disposition - return in 6 months.            Medication Adjustments/Labs and Tests Ordered: Current medicines are reviewed at length with the patient today.  Concerns regarding medicines are outlined above.  No orders of the defined types were placed in this encounter.  No orders of the defined types were placed in this encounter.   Patient Instructions  Medication Instructions:  The current medical regimen is effective;  continue present plan and medications as directed. Please refer to the Current Medication list given to you today.  *If you need a refill on your cardiac medications before your next appointment, please call your pharmacy*  Lab Work: NONE If you have labs (blood work) drawn today and your tests are completely normal, you will receive your results only by:  MyChart Message (if you have MyChart) OR  A paper copy in the mail If you have any lab test that is abnormal or we need to change your treatment, we will call you to review the results.  Testing/Procedures: NONE  Follow-Up: At [facility], you and your health needs are our priority.  As part of our continuing mission to provide you with exceptional heart care, we have created designated Provider Care Teams.  These Care Teams include your primary Cardiologist (physician) and Advanced Practice Providers (APPs -  Physician Assistants and Nurse Practitioners) who all work together to provide you with the care you need, when you need it.  Your next appointment:   6 month(s)  Provider:   Jerel Balding, MD     Other Instructions     Signed, Stephen JAYSON Hoover, NP  08/30/2022 4:21 PM     [facility] "

## 2022-08-30 ENCOUNTER — Ambulatory Visit: Payer: Medicare HMO | Attending: General Practice | Admitting: Cardiology

## 2022-08-30 ENCOUNTER — Telehealth: Payer: Self-pay | Admitting: Cardiology

## 2022-08-30 ENCOUNTER — Encounter: Payer: Self-pay | Admitting: General Practice

## 2022-08-30 VITALS — BP 112/70 | HR 79 | Ht 72.0 in | Wt 293.4 lb

## 2022-08-30 DIAGNOSIS — I25119 Atherosclerotic heart disease of native coronary artery with unspecified angina pectoris: Secondary | ICD-10-CM | POA: Diagnosis not present

## 2022-08-30 DIAGNOSIS — I10 Essential (primary) hypertension: Secondary | ICD-10-CM | POA: Diagnosis not present

## 2022-08-30 DIAGNOSIS — G4733 Obstructive sleep apnea (adult) (pediatric): Secondary | ICD-10-CM | POA: Diagnosis not present

## 2022-08-30 DIAGNOSIS — R002 Palpitations: Secondary | ICD-10-CM | POA: Diagnosis not present

## 2022-08-30 NOTE — Patient Instructions (Signed)
Medication Instructions:  The current medical regimen is effective;  continue present plan and medications as directed. Please refer to the Current Medication list given to you today.  *If you need a refill on your cardiac medications before your next appointment, please call your pharmacy*  Lab Work: NONE If you have labs (blood work) drawn today and your tests are completely normal, you will receive your results only by:  Mora (if you have MyChart) OR  A paper copy in the mail If you have any lab test that is abnormal or we need to change your treatment, we will call you to review the results.  Testing/Procedures: NONE  Follow-Up: At [facility], you and your health needs are our priority.  As part of our continuing mission to provide you with exceptional heart care, we have created designated Provider Care Teams.  These Care Teams include your primary Cardiologist (physician) and Advanced Practice Providers (APPs -  Physician Assistants and Nurse Practitioners) who all work together to provide you with the care you need, when you need it.  Your next appointment:   6 month(s)  Provider:   Sanda Klein, MD     Other Instructions

## 2022-09-02 NOTE — Addendum Note (Signed)
Addended by: Waylan Rocher on: 09/02/2022 01:22 PM   Modules accepted: Orders

## 2022-09-14 ENCOUNTER — Other Ambulatory Visit: Payer: Self-pay | Admitting: Cardiovascular Disease

## 2022-10-25 ENCOUNTER — Encounter: Payer: Self-pay | Admitting: Cardiovascular Disease

## 2022-10-25 NOTE — Progress Notes (Deleted)
Cardiology Clinic Note   Patient Name: Stephen Franklin. Hinton Date of Encounter: 10/25/2022  Primary Care Provider:  Frederich Chick., MD Primary Cardiologist:  Thurmon Fair, MD  Patient Profile    ***  Past Medical History    Past Medical History:  Diagnosis Date   Anxiety    Diabetes mellitus without complication (HCC)    Type 2   Dysrhythmia    palpitations   GERD (gastroesophageal reflux disease)    High cholesterol    History of kidney stones    Hypothyroidism    hx of correcte no meds now   Sleep apnea    cpap   Past Surgical History:  Procedure Laterality Date   COLONOSCOPY     HERNIA REPAIR     Left inguinal, umbilical with mesh 06-23-17 Dr. Andrey Campanile   LEFT HEART CATH AND CORONARY ANGIOGRAPHY N/A 09/12/2018   Procedure: LEFT HEART CATH AND CORONARY ANGIOGRAPHY;  Surgeon: [place]az[place], Peter M, MD;  Location[facility] INVASIVE CV LAB;  Service: Cardiovascular;  Laterality: N/A;   UMBILICAL HERNIA REPAIR N/A 06/23/2017   Procedure: LAPAROSCOPIC ASSISTED UMBILICAL HERNIA REPAIR WITH MESH;  Surgeon: Gaynelle Adu, MD;  Location: [facility];  Service: General;  Laterality: N/A;    Allergies  Allergies  Allergen Reactions   Epinephrine Shortness Of Breath    Shortness of breath and face swells   Naproxen Sodium Hives, Shortness Of Breath and Swelling   Bee Venom Itching    History of Present Illness    ***  Home Medications    Prior to Admission medications   Medication Sig Start Date End Date Taking? Authorizing Provider  Armodafinil 200 MG TABS Take 200 mg by mouth daily in the afternoon. 11/01/19   [provider]  aspirin EC 81 MG tablet Take 81 mg by mouth daily. Swallow whole.    [provider]  bisoprolol (ZEBETA) 5 MG tablet Take 1 tablet by mouth once daily 01/25/22   Croitoru, Mihai, MD  Blood Glucose Monitoring Suppl (TGT BLOOD GLUCOSE MONITORING) w/Device KIT Inject into the skin daily in the afternoon. 06/27/20   [provider]   buPROPion (WELLBUTRIN XL) 150 MG 24 hr tablet Take 150 mg by mouth daily.    [provider]  clonazePAM (KLONOPIN) 1 MG tablet Take 1 mg by mouth 3 (three) times daily as needed (for anxiety).  Patient not taking: Reported on 08/30/2022 10/18/16   [provider]  Dulaglutide (TRULICITY) 0.75 MG/0.5ML SOPN Inject into the skin once a week.    [provider]  fenofibrate 160 MG tablet Take 160 mg by mouth daily with breakfast.  06/08/17   [provider]  glucose blood (CONTOUR TEST) test strip 1 each by Other route as needed. 07/21/21   [provider]  JARDIANCE 10 MG TABS tablet Take 1 tablet by mouth daily. 02/21/19   [provider]  omega-3 acid ethyl esters (LOVAZA) 1 g capsule Take 1 capsule (1 g total) by mouth 2 (two) times daily. Patient must schedule appointment for future refills second attempt 06/24/22   Croitoru, Rachelle Hora, MD  omeprazole (PRILOSEC) 40 MG capsule Take 40 mg by mouth daily before breakfast.  07/17/18   [provider]  pravastatin (PRAVACHOL) 40 MG tablet Take 1 tablet by mouth once daily 09/14/22   Croitoru, Mihai, MD  pregabalin (LYRICA) 75 MG capsule Take 75 mg by mouth 2 (two) times daily. Two tablets twice daily 03/08/22   [provider]  rOPINIRole (REQUIP) 1 MG tablet TAKE 1 TABLET BY MOUTH THREE TIMES DAILY . APPOINTMENT REQUIRED FOR FUTURE REFILLS 07/06/22   Sater, Pearletha Furl, MD  sertraline (ZOLOFT) 100 MG tablet Take 150 mg by mouth daily.     [provider]  sildenafil (VIAGRA) 50 MG tablet Take 1 tablet by mouth as needed an hour before sex. Do not take more than 1 time in 48 hours. This is not a daily medicine. 07/25/22     testosterone cypionate (DEPOTESTOSTERONE CYPIONATE) 200 MG/ML injection Inject 1 mL into the muscle every 21 ( twenty-one) days.  06/23/18   [provider]    Family History    Family History  Adopted: Yes  Problem Relation Age of Onset   Heart failure  Mother    Breast cancer Mother    Diabetes Mother    Lung cancer Father    is adopted.   Social History    Social History   Socioeconomic History   Marital status: Married    Spouse name: Not on file   Number of children: Not on file   Years of education: Not on file   Highest education level: Not on file  Occupational History   Not on file  Tobacco Use   Smoking status: Former    Packs/day: 1.00    Years: 27.00    Additional pack years: 0.00    Total pack years: 27.00    Types: Cigarettes    Start date: 06/15/1983    Quit date: 06/21/2010    Years since quitting: 12.3   Smokeless tobacco: Former    Quit date: 06/13/2010  Vaping Use   Vaping Use: Never used  Substance and Sexual Activity   Alcohol use: No    Comment: hx of none in 7-8 years   Drug use: No   Sexual activity: Never  Other Topics Concern   Not on file  Social History Narrative   Not on file   Social Determinants of Health   Financial Resource Strain: Not on file  Food Insecurity: Not on file  Transportation Needs: Not on file  Physical Activity: Not on file  Stress: Not on file  Social Connections: Not on file  Intimate Partner Violence: Not on file     Review of Systems    General:  No chills, fever, night sweats or weight changes.  Cardiovascular:  No chest pain, dyspnea on exertion, edema, orthopnea, palpitations, paroxysmal nocturnal dyspnea. Dermatological: No rash, lesions/masses Respiratory: No cough, dyspnea Urologic: No hematuria, dysuria Abdominal:   No nausea, vomiting, diarrhea, bright red blood per rectum, melena, or hematemesis Neurologic:  No visual changes, wkns, changes in mental status. All other systems reviewed and are otherwise negative except as noted above.  Physical Exam    VS:  There were no vitals taken for this visit. , BMI There is no height or weight on file to calculate BMI. GEN: Well nourished, well developed, in no acute distress. HEENT: normal. Neck:  Supple, no JVD, carotid bruits, or masses. Cardiac: RRR, no murmurs, rubs, or gallops. No clubbing, cyanosis, edema.  Radials/DP/PT 2+ and equal bilaterally.  Respiratory:  Respirations regular and unlabored, clear to auscultation bilaterally. GI: Soft, nontender, nondistended, BS + x 4. MS: no deformity or atrophy. Skin: warm and dry, no rash. Neuro:  Strength and sensation are intact. Psych: Normal affect.  Accessory Clinical Findings    Recent Labs: No results found for requested labs within last 365 days.   Recent Lipid  Panel    Component Value Date/Time   CHOL 174 01/02/2010 1237   TRIG 298.0 (H) 01/02/2010 1237   HDL 30.80 (L) 01/02/2010 1237   CHOLHDL 6 01/02/2010 1237   VLDL 59.6 (H) 01/02/2010 1237   LDLDIRECT 95.1 01/02/2010 1237    No BP recorded.  {Refresh Note OR Click here to enter BP  :1}***    ECG personally reviewed by me today- *** - No acute changes      Assessment & Plan   1.  ***   Thomasene Ripple. Stephen Vanatta NP-C     10/25/2022, 2:46 PM [facility] [address] Office [phone] Fax [phone]    I spent***minutes examining this patient, reviewing medications, and using patient centered shared decision making involving her cardiac care.  Prior to her visit I spent greater than 20 minutes reviewing her past medical history,  medications, and prior cardiac tests.

## 2022-10-26 ENCOUNTER — Ambulatory Visit: Payer: Medicare HMO | Admitting: General Practice

## 2022-10-27 ENCOUNTER — Ambulatory Visit: Payer: Medicare HMO | Admitting: General Practice

## 2022-10-27 NOTE — Progress Notes (Deleted)
Cardiology Clinic Note   Patient Name: Stephen Franklin. Messler Date of Encounter: 10/27/2022  Primary Care Provider:  Frederich Chick., MD Primary Cardiologist:  Thurmon Fair, MD  Patient Profile     Stephen Franklin. [age] year old male presents to the clinic today for follow-up evaluation of his coronary artery disease and essential hypertension.  Past Medical History    Past Medical History:  Diagnosis Date   Anxiety    Diabetes mellitus without complication (HCC)    Type 2   Dysrhythmia    palpitations   GERD (gastroesophageal reflux disease)    High cholesterol    History of kidney stones    Hypothyroidism    hx of correcte no meds now   Sleep apnea    cpap   Past Surgical History:  Procedure Laterality Date   COLONOSCOPY     HERNIA REPAIR     Left inguinal, umbilical with mesh 06-23-17 Dr. Andrey Campanile   LEFT HEART CATH AND CORONARY ANGIOGRAPHY N/A 09/12/2018   Procedure: LEFT HEART CATH AND CORONARY ANGIOGRAPHY;  Surgeon: Swazil[place], Peter M, MD;  Location: [facility] INVASIVE CV LAB;  Service: Cardiovascular;  Laterality: N/A;   UMBILICAL HERNIA REPAIR N/A 06/23/2017   Procedure: LAPAROSCOPIC ASSISTED UMBILICAL HERNIA REPAIR WITH MESH;  Surgeon: Gaynelle Adu, MD;  Location: [facility];  Service: General;  Laterality: N/A;    Allergies  Allergies  Allergen Reactions   Epinephrine Shortness Of Breath    Shortness of breath and face swells   Naproxen Sodium Hives, Shortness Of Breath and Swelling   Bee Venom Itching    History of Present Illness    [name] has a PMH of OSA on CPAP, hypothyroidism, diabetes, palpitations, hypertriglyceridemia, HTN, HLD, GERD, PVCs and nonobstructive CAD.  He establish care with Dr. Royann Shivers in 2014 for palpitations and shortness of breath.  He underwent echocardiogram which was normal and did not show evidence of pulmonary hypertension.  He was working in his yard in 2020 and experienced severe retrosternal chest tightness.  He also noted  burning sensation in the left side of his neck.  He underwent cardiac catheterization 3/20 which showed mild nonobstructive CAD in his first diagonal.  At that time he wore a cardiac event monitor which showed normal sinus rhythm, frequent PVCs, and his beta-blocker was increased.  He was seen in follow-up by Dr. Royann Shivers on 12/25/2020.  At that time he continued to have episodes of chest tightness that would last for 10-30 seconds.  The episodes were occasionally accompanied by mild dyspnea and lightheadedness.  His echocardiogram 5/23 for persistent dyspnea showed an EF of 55-60%, trivial MR, mild dilation of his aortic root measuring 38 mm.  He was seen in follow-up by Wallis Bamberg, NP on 08/30/2022.  He reported that he generally did not feel well.  There has been no change for several years.  He was able to build a fence without symptoms.  He was regularly walking with his wife.  He did question whether he needed a repeat cardiac catheterization.  He felt like this may need to be done routinely.  He did note palpitations which were less noticeable since he had been on bisoprolol.  He only occasionally noticed heart racing.  He did note he would have the urge to cough which would terminate his symptoms.  He denied chest pain, dyspnea, orthopnea, lower extremity swelling, syncope, weight gain and early satiety.  He presents to the clinic today for follow-up evaluation and states***.  ***  denies chest pain, shortness of breath, lower extremity edema, fatigue, palpitations, melena, hematuria, hemoptysis, diaphoresis, weakness, presyncope, syncope, orthopnea, and PND.  Palpitations-stable.  Continues with occasional brief episodes of palpitations.  Better controlled with beta-blocker therapy. Continue bisoprolol Avoid triggers caffeine, chocolate, EtOH etc. Increase physical activity as tolerated  Coronary artery disease-no chest pain today.  Noted to have nonobstructive CAD via catheterization  2020.  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 Continue bisoprolol, Lovaza, Pravachol, aspirin Low-sodium high-fiber diet Increase physical activity as tolerated  Essential hypertension-BP today***. Continue current medical therapy Low-sodium diet Maintain blood pressure log  OSA-waking up well rested.  Reports compliance with CPAP. Continue weight loss, avoid supine sleeping Continue CPAP use  Disposition: Follow-up with Dr. Royann Shivers or me in 6-9 months.  Home Medications    Prior to Admission medications   Medication Sig Start Date End Date Taking? Authorizing Provider  Armodafinil 200 MG TABS Take 200 mg by mouth daily in the afternoon. 11/01/19   [provider]  aspirin EC 81 MG tablet Take 81 mg by mouth daily. Swallow whole.    [provider]  bisoprolol (ZEBETA) 5 MG tablet Take 1 tablet by mouth once daily 01/25/22   Croitoru, Mihai, MD  Blood Glucose Monitoring Suppl (TGT BLOOD GLUCOSE MONITORING) w/Device KIT Inject into the skin daily in the afternoon. 06/27/20   [provider]  buPROPion (WELLBUTRIN XL) 150 MG 24 hr tablet Take 150 mg by mouth daily.    [provider]  clonazePAM (KLONOPIN) 1 MG tablet Take 1 mg by mouth 3 (three) times daily as needed (for anxiety).  Patient not taking: Reported on 08/30/2022 10/18/16   [provider]  Dulaglutide (TRULICITY) 0.75 MG/0.5ML SOPN Inject into the skin once a week.    [provider]  fenofibrate 160 MG tablet Take 160 mg by mouth daily with breakfast.  06/08/17   [provider]  glucose blood (CONTOUR TEST) test strip 1 each by Other route as needed. 07/21/21   [provider]  JARDIANCE 10 MG TABS tablet Take 1 tablet by mouth daily. 02/21/19   [provider]  omega-3 acid ethyl esters (LOVAZA) 1 g capsule Take 1 capsule (1 g total) by mouth 2 (two) times daily. Patient must schedule appointment for future refills second  attempt 06/24/22   Croitoru, Rachelle Hora, MD  omeprazole (PRILOSEC) 40 MG capsule Take 40 mg by mouth daily before breakfast.  07/17/18   [provider]  pravastatin (PRAVACHOL) 40 MG tablet Take 1 tablet by mouth once daily 09/14/22   Croitoru, Mihai, MD  pregabalin (LYRICA) 75 MG capsule Take 75 mg by mouth 2 (two) times daily. Two tablets twice daily 03/08/22   [provider]  rOPINIRole (REQUIP) 1 MG tablet TAKE 1 TABLET BY MOUTH THREE TIMES DAILY . APPOINTMENT REQUIRED FOR FUTURE REFILLS 07/06/22   Sater, Pearletha Furl, MD  sertraline (ZOLOFT) 100 MG tablet Take 150 mg by mouth daily.     [provider]  sildenafil (VIAGRA) 50 MG tablet Take 1 tablet by mouth as needed an hour before sex. Do not take more than 1 time in 48 hours. This is not a daily medicine. 07/25/22     testosterone cypionate (DEPOTESTOSTERONE CYPIONATE) 200 MG/ML injection Inject 1 mL into the muscle every 21 ( twenty-one) days.  06/23/18   [provider]    Family History    Family History  Adopted: Yes  Problem Relation Age of Onset  Heart failure Mother    Breast cancer Mother    Diabetes Mother    Lung cancer Father    is adopted.   Social History    Social History   Socioeconomic History   Marital status: Married    Spouse name: Not on file   Number of children: Not on file   Years of education: Not on file   Highest education level: Not on file  Occupational History   Not on file  Tobacco Use   Smoking status: Former    Packs/day: 1.00    Years: 27.00    Additional pack years: 0.00    Total pack years: 27.00    Types: Cigarettes    Start date: 06/15/1983    Quit date: 06/21/2010    Years since quitting: 12.3   Smokeless tobacco: Former    Quit date: 06/13/2010  Vaping Use   Vaping Use: Never used  Substance and Sexual Activity   Alcohol use: No    Comment: hx of none in 7-8 years   Drug use: No   Sexual activity: Never  Other Topics Concern   Not on file  Social  History Narrative   Not on file   Social Determinants of Health   Financial Resource Strain: Not on file  Food Insecurity: Not on file  Transportation Needs: Not on file  Physical Activity: Not on file  Stress: Not on file  Social Connections: Not on file  Intimate Partner Violence: Not on file     Review of Systems    General:  No chills, fever, night sweats or weight changes.  Cardiovascular:  No chest pain, dyspnea on exertion, edema, orthopnea, palpitations, paroxysmal nocturnal dyspnea. Dermatological: No rash, lesions/masses Respiratory: No cough, dyspnea Urologic: No hematuria, dysuria Abdominal:   No nausea, vomiting, diarrhea, bright red blood per rectum, melena, or hematemesis Neurologic:  No visual changes, wkns, changes in mental status. All other systems reviewed and are otherwise negative except as noted above.  Physical Exam    VS:  There were no vitals taken for this visit. , BMI There is no height or weight on file to calculate BMI. GEN: Well nourished, well developed, in no acute distress. HEENT: normal. Neck: Supple, no JVD, carotid bruits, or masses. Cardiac: RRR, no murmurs, rubs, or gallops. No clubbing, cyanosis, edema.  Radials/DP/PT 2+ and equal bilaterally.  Respiratory:  Respirations regular and unlabored, clear to auscultation bilaterally. GI: Soft, nontender, nondistended, BS + x 4. MS: no deformity or atrophy. Skin: warm and dry, no rash. Neuro:  Strength and sensation are intact. Psych: Normal affect.  Accessory Clinical Findings    Recent Labs: No results found for requested labs within last 365 days.   Recent Lipid Panel    Component Value Date/Time   CHOL 174 01/02/2010 1237   TRIG 298.0 (H) 01/02/2010 1237   HDL 30.80 (L) 01/02/2010 1237   CHOLHDL 6 01/02/2010 1237   VLDL 59.6 (H) 01/02/2010 1237   LDLDIRECT 95.1 01/02/2010 1237    No BP recorded.  {Refresh Note OR Click here to enter BP  :1}***    ECG personally reviewed by  me today- *** - No acute changes  Cardiac event monitor 11/09/2018 The baseline rhythm is normal sinus rhythm with normal circadian variation. There are very frequent PVCs. They make up 13% of all recorded beats and commonly occur in a pattern of quadrigeminy, less often bigeminy. All the PVCs appear to have the same morphology. A single ventricular  couplet is seen, but there is no ventricular tachycardia. The patient's symptoms are correlated to periods of increased PVC frequency.   Abnormal event monitor due to symptomatic and very frequent monophasic PVCs. Complex ventricular arrhythmia is not seen and there is no significant atrial arrhythmia  Cardiac catheterization 09/12/2018  Ost 1st Diag to 1st Diag lesion is 45% stenosed. The left ventricular systolic function is normal. LV end diastolic pressure is normal. The left ventricular ejection fraction is 55-65% by visual estimate.   1. Nonobstructive CAD- mild 2. Normal LV function 3. Normal LVEDP   Echocardiogram 10/28/21  EF 55 to 60%, trivial MR, mild dilatation of the aortic root at 38 mm.   Assessment & Plan   1.  ***   Thomasene Ripple. Clide Remmers NP-C     10/27/2022, 6:46 AM [facility] [address] Office [phone] Fax [phone]    I spent***minutes examining this patient, reviewing medications, and using patient centered shared decision making involving her cardiac care.  Prior to her visit I spent greater than 20 minutes reviewing her past medical history,  medications, and prior cardiac tests.

## 2022-10-29 ENCOUNTER — Encounter: Payer: Self-pay | Admitting: Student

## 2022-10-29 ENCOUNTER — Ambulatory Visit (INDEPENDENT_AMBULATORY_CARE_PROVIDER_SITE_OTHER): Payer: Medicare HMO

## 2022-10-29 ENCOUNTER — Telehealth: Payer: Self-pay | Admitting: Cardiovascular Disease

## 2022-10-29 ENCOUNTER — Ambulatory Visit: Payer: Medicare HMO | Attending: General Practice | Admitting: Student

## 2022-10-29 VITALS — BP 116/76 | HR 93 | Ht 72.0 in | Wt 287.6 lb

## 2022-10-29 DIAGNOSIS — R002 Palpitations: Secondary | ICD-10-CM | POA: Diagnosis not present

## 2022-10-29 DIAGNOSIS — Z8709 Personal history of other diseases of the respiratory system: Secondary | ICD-10-CM | POA: Diagnosis not present

## 2022-10-29 DIAGNOSIS — E785 Hyperlipidemia, unspecified: Secondary | ICD-10-CM

## 2022-10-29 DIAGNOSIS — R0609 Other forms of dyspnea: Secondary | ICD-10-CM

## 2022-10-29 DIAGNOSIS — I10 Essential (primary) hypertension: Secondary | ICD-10-CM

## 2022-10-29 DIAGNOSIS — R072 Precordial pain: Secondary | ICD-10-CM | POA: Diagnosis not present

## 2022-10-29 DIAGNOSIS — I2510 Atherosclerotic heart disease of native coronary artery without angina pectoris: Secondary | ICD-10-CM

## 2022-10-29 DIAGNOSIS — I493 Ventricular premature depolarization: Secondary | ICD-10-CM | POA: Diagnosis not present

## 2022-10-29 MED ORDER — ROSUVASTATIN CALCIUM 10 MG PO TABS: 10.0000 mg | ORAL_TABLET | Freq: Every day | ORAL | 3 refills | Status: DC

## 2022-10-29 MED ORDER — BISOPROLOL FUMARATE 10 MG PO TABS: 10.0000 mg | ORAL_TABLET | Freq: Every day | ORAL | 3 refills | Status: DC

## 2022-10-29 MED ORDER — NITROGLYCERIN 0.4 MG SL SUBL: 0.4000 mg | SUBLINGUAL_TABLET | SUBLINGUAL | 2 refills | Status: AC | PRN

## 2022-10-29 NOTE — Telephone Encounter (Signed)
Patient's spouse is calling because she would like an estimate on how much the heart monitor is going to cost them. Please advise.

## 2022-10-29 NOTE — Telephone Encounter (Signed)
Left voicemail for patient to return call to office. 

## 2022-10-29 NOTE — Patient Instructions (Addendum)
Medication Instructions:   STOP Pravastatin  START Rosuvastatin 10 mg daily INCREASE Bisoprolol to 10 mg daily  TAKE Nitroglycerin 0.4 mg as needed for chest pain. DO NOT use more than 3 tablets in an episode.  *If you need a refill on your cardiac medications before your next appointment, please call your pharmacy*  Lab Work: NONE ordered at this time of appointment   If you have labs (blood work) drawn today and your tests are completely normal, you will receive your results only by: MyChart Message (if you have MyChart) OR A paper copy in the mail If you have any lab test that is abnormal or we need to change your treatment, we will call you to review the results.  Testing/Procedures: Stephen Levering, NP has ordered a Myocardial Perfusion Imaging Study Eugenie Birks).    The test will take approximately 3 to 4 hours to complete; you may bring reading material.  If someone comes with you to your appointment, they will need to remain in the main lobby due to limited space in the testing area. **If you are pregnant or breastfeeding, please notify the nuclear lab prior to your appointment**  You will need to hold the following medications prior to your stress test: Bisoprolol (24 hours prior to test)   How to prepare for your Myocardial Perfusion Test: Do not eat or drink 3 hours prior to your test, except you may have water. Do not consume products containing caffeine (regular or decaffeinated) 12 hours prior to your test. (ex: coffee, chocolate, sodas, tea). Do wear comfortable clothes (no dresses or overalls) and walking shoes, tennis shoes preferred (No heels or open toe shoes are allowed). Do NOT wear cologne, perfume, aftershave, or lotions (deodorant is allowed). If these instructions are not followed, your test will have to be rescheduled.   ZIO XT- Long Term Monitor Instructions  Your physician has requested you wear a ZIO patch monitor for 14 days.  This is a single patch  monitor. [employer] supplies one patch monitor per enrollment. Additional stickers are not available. Please do not apply patch if you will be having a Nuclear Stress Test,  Echocardiogram, Cardiac CT, MRI, or Chest Xray during the period you would be wearing the  monitor. The patch cannot be worn during these tests. You cannot remove and re-apply the  ZIO XT patch monitor.  Your ZIO patch monitor will be mailed 3 day USPS to your address on file. It may take 3-5 days  to receive your monitor after you have been enrolled.  Once you have received your monitor, please review the enclosed instructions. Your monitor  has already been registered assigning a specific monitor serial # to you.  Billing and Patient Assistance Program Information  We have supplied [employer] with any of your insurance information on file for billing purposes. [employer] offers a sliding scale Patient Assistance Program for patients that do not have  insurance, or whose insurance does not completely cover the cost of the ZIO monitor.  You must apply for the Patient Assistance Program to qualify for this discounted rate.  To apply, please call [employer] at [phone], select option 4, select option 2, ask to apply for  Patient Assistance Program. Meredeth Ide will ask your household income, and how many people  are in your household. They will quote your out-of-pocket cost based on that information.  [employer] will also be able to set up a 72-month, interest-free payment plan if needed.  Applying the monitor   Shave hair  from upper left chest.  Hold abrader disc by orange tab. Rub abrader in 40 strokes over the upper left chest as  indicated in your monitor instructions.  Clean area with 4 enclosed alcohol pads. Let dry.  Apply patch as indicated in monitor instructions. Patch will be placed under collarbone on left  side of chest with arrow pointing upward.  Rub patch adhesive wings for 2 minutes. Remove white label marked "1".  Remove the white  label marked "2". Rub patch adhesive wings for 2 additional minutes.  While looking in a mirror, press and release button in center of patch. A small green light will  flash 3-4 times. This will be your only indicator that the monitor has been turned on.  Do not shower for the first 24 hours. You may shower after the first 24 hours.  Press the button if you feel a symptom. You will hear a small click. Record Date, Time and  Symptom in the Patient Logbook.  When you are ready to remove the patch, follow instructions on the last 2 pages of Patient  Logbook. Stick patch monitor onto the last page of Patient Logbook.  Place Patient Logbook in the blue and white box. Use locking tab on box and tape box closed  securely. The blue and white box has prepaid postage on it. Please place it in the mailbox as  soon as possible. Your physician should have your test results approximately 7 days after the  monitor has been mailed back to [facility].  Call [facility] Customer Care at [phone] if you have questions regarding  your ZIO XT patch monitor. Call them immediately if you see an orange light blinking on your  monitor.  If your monitor falls off in less than 4 days, contact our Monitor department at [phone].  If your monitor becomes loose or falls off after 4 days call [employer] at [phone] for  suggestions on securing your monitor  Follow-Up: At [facility], you and your health needs are our priority.  As part of our continuing mission to provide you with exceptional heart care, we have created designated Provider Care Teams.  These Care Teams include your primary Cardiologist (physician) and Advanced Practice Providers (APPs -  Physician Assistants and Nurse Practitioners) who all work together to provide you with the care you need, when you need it.  Your next appointment:   8-10 week(s)  Provider:   Carlos Levering, NP        Other  Instructions You have been referred to Pulmonary

## 2022-10-29 NOTE — Progress Notes (Unsigned)
Enrolled patient for a 14 day Zio XT  monitor to be mailed to patients home  °

## 2022-10-29 NOTE — Progress Notes (Signed)
Cardiology Clinic Note   Date: 10/29/2022 ID: Stephen Shutters. Narinder, Dotts 1965/12/09, MRN [id number]  Primary Cardiologist:  Thurmon Fair, MD  Patient Profile    Stephen Franklin is a 57 y.o. male who presents to the clinic today for follow-up.   Past medical history significant for: Nonobstructive CAD. LHC 09/12/2018 (unstable angina): Ostial D1-D1 45%.  Nonobstructive CAD.  Normal LV function.  Normal LVEDP. Echo 10/28/2021: EF 55 to 60%.  Trivial MR.  Mild dilatation of aortic root 38 mm. Hypertension. Hyperlipidemia. Lipid panel 08/12/2022: LDL 88, HDL 30, TG 232, total 135. Palpitations. Event monitor 11/21/2018: Frequent PVCs (13%) and commonly occur in a pattern of quadrigeminy, less often bigeminy.  All PVCs appear to have the same morphology.  Single ventricular couplet but no V. tach.  Patient's symptoms correlated to periods of increased PVCs. OSA. T2DM. GERD.   History of Present Illness    Stephen Franklin was first evaluated by Dr. Royann Shivers 01/22/2013 for palpitations and shortness of breath.  He continues to be followed for the above outlined history.  Patient was last seen in the office by Wallis Bamberg, NP on 08/30/2022 for follow-up.  He reported general malaise it has been going on for several years.  He was otherwise stable and no changes were made.  Today, patient is here alone. He is very concerned about the increased frequency of palpitations. He feels they are more intense and lasting longer. When they occur he will get flushed in the face and feel like he can't catch his breath. He reports constant DOE that worsens depending on intensity of activity and resolves with rest. He also describes sharp central chest pain with or without exertion that lasts 3-4 seconds then resolves for about 3-4 seconds before returning for same length of time. This could happen several times before going away completely. He feels all of these symptoms have been progressing over the last  3 years or so ever since his CPAP machine was recalled secondary to an issue with the foam in the mask "getting into the lungs." Since that time he has frequent URIs and a constant congested cough. He has never been seen by pulmonology.      ROS: All other systems reviewed and are otherwise negative except as noted in History of Present Illness.  Studies Reviewed    ECG is not performed today.       Physical Exam    VS:  BP 116/76   Pulse 93   Ht 6' (1.829 m)   Wt 287 lb 9.6 oz (130.5 kg)   SpO2 97%   BMI 39.01 kg/m  , BMI Body mass index is 39.01 kg/m.  GEN: Well nourished, well developed, in no acute distress. Neck: No JVD or carotid bruits. Cardiac:  RRR. No extrasystole.  No murmurs. No rubs or gallops.   Respiratory:  Respirations regular and unlabored. Clear to auscultation without rales, wheezing or rhonchi. GI: Soft, nontender, nondistended. Extremities: Radials/DP/PT 2+ and equal bilaterally. No clubbing or cyanosis. No edema.  Skin: Warm and dry, no rash. Neuro: Strength intact.  Assessment & Plan    Nonobstructive CAD/DOE.  LHC March 2020 showed mild nonobstructive CAD.  Echo May 2023 showed normal LV function.  Patient describes sharp central chest pain lasting 3-4 seconds before resolving on its own for 3-4 seconds then returning. He can have several episodes with or without exertion before it goes away completely. He has constant DOE that resolves with rest. Given frequent PVCs  I do not think coronary CTA is an option for him. Will get nuclear stress test for further evaluation. Will hold off on echo for now. Continue aspirin, bisoprolol (see #4), Crestor (see #3). Will give prn NTG for sustained chest pain.  Hypertension. BP today 116/76.  Patient denies headaches, dizziness or vision changes. Continue bisoprolol (see #4). He will monitor BP and contact the office if he gets SBP readings <100.  Hyperlipidemia.  LDL February 2024 88, not at goal.Will stop  pravastatin and start rosuvastatin 10 mg. Will repeat lipid panel and LFTs in 10-12 weeks.  Palpitations/frequent PVCs.  Event monitor June 2020 showed frequent PVCs (13%).  Patient feels frequency and intensity of palpitations have increased. He reports associated facial flushing and shortness of breath with palpitations. No ectopy auscultated today. Will evaluate with 14 day zio. Increase bisoprolol to 10 mg daily.  Frequent URIs. Patient reports frequent URIs and chronic congested cough for the last 3 years after his CPAP machine was recalled. His lungs are clear to auscultation today. Will refer to pulmonology for further evaluation.   Disposition: Nuclear stress test. Prn SL NTG. Stop pravastatin and start rosuvastatin 10 mg daily. 2 week zio. Increase bisoprolol to 10 mg daily. Refer to pulmonology. Return in 8-10 weeks or sooner as needed.      Shared Decision Making/Informed Consent The risks [chest pain, shortness of breath, cardiac arrhythmias, dizziness, blood pressure fluctuations, myocardial infarction, stroke/transient ischemic attack, nausea, vomiting, allergic reaction, radiation exposure, metallic taste sensation and life-threatening complications (estimated to be 1 in 10,000)], benefits (risk stratification, diagnosing coronary artery disease, treatment guidance) and alternatives of a nuclear stress test were discussed in detail with Mr. Verhoeven and he agrees to proceed.   Signed, Etta Grandchild. Kellin Fifer, DNP, NP-C

## 2022-11-01 ENCOUNTER — Telehealth (HOSPITAL_COMMUNITY): Payer: Self-pay

## 2022-11-01 ENCOUNTER — Telehealth: Payer: Self-pay | Admitting: Cardiovascular Disease

## 2022-11-01 DIAGNOSIS — R072 Precordial pain: Secondary | ICD-10-CM

## 2022-11-01 DIAGNOSIS — R002 Palpitations: Secondary | ICD-10-CM | POA: Diagnosis not present

## 2022-11-01 NOTE — Telephone Encounter (Signed)
Patient's wife is requesting call back to see about changing the upcoming appts for test to a regular treadmill test. Please advise.

## 2022-11-01 NOTE — Telephone Encounter (Signed)
Returned call to patient's wife who states that the the [place] is too expensive and reports that the pricing is "insane" after speaking with insurance. Patient's wife is wanting to know if there is a cheaper option such as a treadmill exercise test. Advised that other Exercise nuclear may be around the same price- would like to know if just POET would be sufficient. Patients wife would like to check with Dr. For recommendations. Will forward message over.

## 2022-11-01 NOTE — Telephone Encounter (Signed)
Plain tradmill ECG stress test is a reasonable alternative. Please schedule. Do not take bisoprolol the day of the test.

## 2022-11-01 NOTE — Telephone Encounter (Signed)
Detailed instructions left on the patient's answering machine. Asked to call back with any questions. S.Hiram Mciver EMTP/CCT 

## 2022-11-02 NOTE — Telephone Encounter (Signed)
LVM that he could call insurance to see if any cost would be incurred or if it is completely covered.  If any further questions can call our office

## 2022-11-02 NOTE — Telephone Encounter (Signed)
Croitoru, Mihai, MD    Plain tradmill ECG stress test is a reasonable alternative. Please schedule. Do not take bisoprolol the day of the test.                           Patient Instructions for Stress Test  Medication instructions:   Do NOT take your _____Bisoprolol___________________________  Do not eat, drink or use tobacco products four hours prior to the test.  Water is ok.  3.  Dress prepared to exercise in a comfortable, two piece clothing outfit and walking shoes.  4.  Bring any current prescription medications with you the day of the test.  5.  Notify the office 24 hours in advance if you cannot keep this appointment.  6.  If you have any questions, please call [phone].   Called and spoke with Jody (dpr). Gave Dr Croitoru's message. Agreed to get the ETT Gave the above instructions. Informed them that someone will reach out to get the ETT scheduled.

## 2022-11-03 ENCOUNTER — Telehealth (HOSPITAL_COMMUNITY): Payer: Self-pay | Admitting: *Deleted

## 2022-11-03 NOTE — Telephone Encounter (Signed)
Left message with detailed instructions for ETT scheduled on 11/04/22.

## 2022-11-04 ENCOUNTER — Ambulatory Visit (HOSPITAL_COMMUNITY): Payer: Medicare HMO | Attending: Cardiology

## 2022-11-04 DIAGNOSIS — R072 Precordial pain: Secondary | ICD-10-CM | POA: Insufficient documentation

## 2022-11-04 LAB — EXERCISE TOLERANCE TEST
Angina Index: 1
Duke Treadmill Score: 5
Estimated workload: 10.1
Exercise duration (min): 9 min
Exercise duration (sec): 0 s
MPHR: 162 {beats}/min
Peak HR: 117 {beats}/min
Percent HR: 72 %
Rest HR: 75 {beats}/min
ST Depression (mm): 0 mm

## 2022-11-05 NOTE — Telephone Encounter (Signed)
All patient concerns addressed. Will sign encounter.   Alver Sorrow, NP

## 2022-11-09 ENCOUNTER — Ambulatory Visit (HOSPITAL_COMMUNITY): Payer: Medicare HMO

## 2022-11-10 ENCOUNTER — Ambulatory Visit (HOSPITAL_COMMUNITY): Payer: Medicare HMO

## 2022-11-10 ENCOUNTER — Other Ambulatory Visit: Payer: Self-pay | Admitting: *Deleted

## 2022-11-10 ENCOUNTER — Ambulatory Visit: Payer: Medicare HMO | Attending: Cardiovascular Disease

## 2022-11-10 DIAGNOSIS — R002 Palpitations: Secondary | ICD-10-CM

## 2022-11-10 NOTE — Progress Notes (Unsigned)
[employer] - first monitor [id number] fell off in 1.5 days, [employer] cancelled charges but study already read before patient notified company of early fall off. Replacement monitor shipped to patient 11/10/22.

## 2022-11-11 ENCOUNTER — Telehealth: Payer: Self-pay | Admitting: Cardiovascular Disease

## 2022-11-11 NOTE — Telephone Encounter (Signed)
Agree with reducing the bisoprolol to 5 mg once daily.  While this medication helps reduce the palpitations, it also lowers the blood pressure and may be why he has increased dizziness.  Since the palpitations are not dangerous, the only purpose of the bisoprolol is to alleviate symptoms.  If he feels worse on the higher dose then he should go back to 5 mg.

## 2022-11-11 NOTE — Telephone Encounter (Signed)
Patient was on the Bisoprolol at 5 mg but then he increased the medication a week ago 10 mg. He did this on his own.  He did NOT take an extra pill.  He keeps back and forth on his dose and states it different each time.  He states feels like "a phone vibration in the leg and hands". He wonders if the medication is the cause.  Patient states that he feels more palpitations.  He states he thinks he needs to be back on the 5 mg. States he didn't feel like palpitations were better on the 10 mg, he feels like they increased on the 10 mg dose.  BP 109/75. He states he doesn't think its anything to worry about  Advised to go back to the 5mg  dose to see if the dizziness, and increased palpitations improve.  Also to see if the feeling "of a ringing phone" in his body resolves.  Advised would cal if any further recommendations from the provider

## 2022-11-11 NOTE — Telephone Encounter (Signed)
  Per MyChart Scheduling message:  Good afternoon. Would you please pass along to Dr c that I am having multitudes of palpitations dizziness and feel like at times I'm going to pass out. I took an extra bisparol this morning and it seemed to make me dizzy and feeling faint so I guess I should go back to one. I'm not sure what's going on but I don't feel good Dr c said my heart was fine and I just had palpitations but I certainly feel like there's more but I will go with him just let him know thank you

## 2022-11-12 NOTE — Telephone Encounter (Signed)
Patient aware of instructions. Will call if any issues

## 2022-11-16 ENCOUNTER — Encounter: Payer: Self-pay | Admitting: Cardiovascular Disease

## 2022-11-22 ENCOUNTER — Telehealth: Payer: Self-pay

## 2022-11-23 ENCOUNTER — Ambulatory Visit: Payer: Medicare HMO | Admitting: Neurology

## 2022-11-23 ENCOUNTER — Encounter: Payer: Self-pay | Admitting: Neurology

## 2022-11-23 VITALS — BP 103/66 | HR 78 | Ht 72.0 in | Wt 290.0 lb

## 2022-11-23 DIAGNOSIS — M79604 Pain in right leg: Secondary | ICD-10-CM

## 2022-11-23 DIAGNOSIS — G4733 Obstructive sleep apnea (adult) (pediatric): Secondary | ICD-10-CM | POA: Diagnosis not present

## 2022-11-23 DIAGNOSIS — G4719 Other hypersomnia: Secondary | ICD-10-CM

## 2022-11-23 DIAGNOSIS — G2581 Restless legs syndrome: Secondary | ICD-10-CM | POA: Diagnosis not present

## 2022-11-23 DIAGNOSIS — Z7985 Long-term (current) use of injectable non-insulin antidiabetic drugs: Secondary | ICD-10-CM

## 2022-11-23 DIAGNOSIS — E1151 Type 2 diabetes mellitus with diabetic peripheral angiopathy without gangrene: Secondary | ICD-10-CM

## 2022-11-23 DIAGNOSIS — M79605 Pain in left leg: Secondary | ICD-10-CM | POA: Insufficient documentation

## 2022-11-23 MED ORDER — TRAMADOL HCL 50 MG PO TABS: 50.0000 mg | ORAL_TABLET | Freq: Every evening | ORAL | 1 refills | Status: DC | PRN

## 2022-11-23 MED ORDER — ROPINIROLE HCL 1 MG PO TABS: ORAL_TABLET | ORAL | 4 refills | Status: DC

## 2022-11-23 NOTE — Progress Notes (Addendum)
[facility]  PATIENT: Stephen Franklin. [place] DOB: 03-Jul-1965  REFERRING DOCTOR OR PCP:  Terri Piedra, PA-C SOURCE: Patient, notes from [facility], downloads from the CPAP.  _________________________________   HISTORICAL  CHIEF COMPLAINT:  Chief Complaint  Patient presents with   Follow-up    Pt in room 10. Here for cpap follow up. Pt reports doing well with cpap uses nighty.     HISTORY OF PRESENT ILLNESS:  [name] is a 57 yo man with obstructive sleep apnea and memory difficulty  Update 11/23/2022 He got a new CPAP machine after the recall around Sept/Oct 2021 [facility])  On the old machine he ws having a lot of congestion and feels that is better.   He is on CPAP +15 cm.  He  is able to use it on a nightly basis for the whole night.  He feels better the next day when he uses CPAP for the entire night but he has .  The download shows excellent compliance (100% > 4 hrs and 100% night).  AHI = 3.5.   Armodafinil is helping the sleepiness.    He has had a lot of congestion and felt he may have allergies.   He will be seeing pulmonology soon.  He tried Flonase without much benefit.  He is concerned if these symptoms might be related to the recall of his previous CPAP device.    He feels his memory is better, back to baseline.   He denies any excessive daytime sleepiness at this time.  He notes more sleepiness during the day and has reduced focus at times.      RLS is well controlled on ropinirole.   No side effects.  He also takes ropinirole 1 in am and 2 at night.    He has lost 25 pounds.    Weight is now stable.   He states it was HgBA1 was 6.2 most recently.  He tries to eat better.    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   Numbness and pain is only in the toes/foot, right > left.   Pregabalin 75 mg helps some (takes 2 po bid).   He takes clonazepam just 1-2 times a week and has not taken hydrocodone recently.    He  has some tramadol from another.      He has lost 15 pounds on Trulicity.     EPWORTH SLEEPINESS SCALE  On a scale of 0 - 3 what is the chance of dozing:  Sitting and Reading:   0 Watching TV:    1 Sitting inactive in a public place: 1 Passenger in car for one hour: 1  Lying down to rest in the afternoon: 3 Sitting and talking to someone: 0 Sitting quietly after lunch:  1 In a car, stopped in traffic:  0  Total (out of 24):     7/24    Was 10/24 before CPAP  He is on disability.  He tries to stay physically and mentally fit.       Sleep history He was diagnosed with OSA in 2012 after presenting with snoring and EDS.   His first one was done at [facility] and he recalls an AHI = 24.   He was titrated to CPAP +14 cm.   He has excellent compliance (100%) and efficacy (30 day AHI = 2.4 today).     He feels much better the next day when he uses CPAP.  He got a new machine (Dreamstation) in 2021     REVIEW OF SYSTEMS: Constitutional: No fevers, chills, sweats, or change in appetite.   Mild insomnia/RLS better on CPAP Eyes: No visual changes, double vision, eye pain Ear, nose and throat: No hearing loss, ear pain, nasal congestion, sore throat Cardiovascular: No chest pain, palpitations Respiratory:  No shortness of breath at rest or with exertion.   Has congestion.   OSA and occ coughing GastrointestinaI: No nausea, vomiting, diarrhea, abdominal pain, fecal incontinence Genitourinary:  No dysuria, urinary retention or frequency.  No nocturia. Musculoskeletal:  has some LBP and myalgias Integumentary: No rash, pruritus, skin lesions Neurological: as above Psychiatric: No depression at this time.  No anxiety Endocrine: No palpitations, diaphoresis, change in appetite, change in weigh or increased thirst Hematologic/Lymphatic:  No anemia, purpura, petechiae. Allergic/Immunologic: No itchy/runny eyes, nasal congestion, recent allergic reactions, rashes  ALLERGIES: Allergies   Allergen Reactions   Epinephrine Shortness Of Breath    Shortness of breath and face swells   Naproxen Sodium Hives, Shortness Of Breath and Swelling   Bee Venom Itching    HOME MEDICATIONS:  Current Outpatient Medications:    Armodafinil 200 MG TABS, Take 200 mg by mouth daily in the afternoon., Disp: , Rfl:    aspirin EC 81 MG tablet, Take 81 mg by mouth daily. Swallow whole., Disp: , Rfl:    bisoprolol (ZEBETA) 10 MG tablet, Take 1 tablet (10 mg total) by mouth daily., Disp: 90 tablet, Rfl: 3   Blood Glucose Monitoring Suppl (TGT BLOOD GLUCOSE MONITORING) w/Device KIT, Inject into the skin daily in the afternoon., Disp: , Rfl:    buPROPion (WELLBUTRIN XL) 150 MG 24 hr tablet, Take 150 mg by mouth daily., Disp: , Rfl:    clonazePAM (KLONOPIN) 1 MG tablet, Take 1 mg by mouth 3 (three) times daily as needed (for anxiety)., Disp: , Rfl:    Dulaglutide (TRULICITY) 0.75 MG/0.5ML SOPN, Inject into the skin once a week., Disp: , Rfl:    fenofibrate 160 MG tablet, Take 160 mg by mouth daily with breakfast. , Disp: , Rfl: 3   glucose blood (CONTOUR TEST) test strip, 1 each by Other route as needed., Disp: , Rfl:    JARDIANCE 10 MG TABS tablet, Take 1 tablet by mouth daily., Disp: , Rfl:    nitroGLYCERIN (NITROSTAT) 0.4 MG SL tablet, Place 1 tablet (0.4 mg total) under the tongue every 5 (five) minutes as needed for chest pain., Disp: 25 tablet, Rfl: 2   omega-3 acid ethyl esters (LOVAZA) 1 g capsule, Take 1 capsule (1 g total) by mouth 2 (two) times daily. Patient must schedule appointment for future refills second attempt, Disp: 30 capsule, Rfl: 0   omeprazole (PRILOSEC) 40 MG capsule, Take 40 mg by mouth daily before breakfast. , Disp: , Rfl:    pregabalin (LYRICA) 75 MG capsule, Take 75 mg by mouth 2 (two) times daily. Two tablets twice daily, Disp: , Rfl:    rosuvastatin (CRESTOR) 10 MG tablet, Take 1 tablet (10 mg total) by mouth daily., Disp: 90 tablet, Rfl: 3   sertraline (ZOLOFT) 100 MG  tablet, Take 150 mg by mouth daily. , Disp: , Rfl:    testosterone cypionate (DEPOTESTOSTERONE CYPIONATE) 200 MG/ML injection, Inject 1 mL into the muscle every 21 ( twenty-one) days. , Disp: , Rfl:    traMADol (ULTRAM) 50 MG tablet, Take 1 tablet (50 mg total) by mouth at bedtime as needed., Disp: 30 tablet, Rfl: 1  rOPINIRole (REQUIP) 1 MG tablet, TAKE 1 TABLET BY MOUTH THREE TIMES DAILY ., Disp: 270 tablet, Rfl: 4  PAST MEDICAL HISTORY: Past Medical History:  Diagnosis Date   Anxiety    Diabetes mellitus without complication (HCC)    Type 2   Dysrhythmia    palpitations   GERD (gastroesophageal reflux disease)    High cholesterol    History of kidney stones    Hypothyroidism    hx of correcte no meds now   Sleep apnea    cpap    PAST SURGICAL HISTORY: Past Surgical History:  Procedure Laterality Date   COLONOSCOPY     HERNIA REPAIR     Left inguinal, umbilical with mesh 06-23-17 Dr. Andrey Campanile   LEFT HEART CATH AND CORONARY ANGIOGRAPHY N/A 09/12/2018   Procedure: LEFT HEART CATH AND CORONARY ANGIOGRAPHY;  Surgeon: [place]azil[place], Peter M, MD;  Location: [facility] INVASIVE CV LAB;  Service: Cardiovascular;  Laterality: N/A;   UMBILICAL HERNIA REPAIR N/A 06/23/2017   Procedure: LAPAROSCOPIC ASSISTED UMBILICAL HERNIA REPAIR WITH MESH;  Surgeon: Gaynelle Adu, MD;  Location: [facility]S;  Service: General;  Laterality: N/A;    FAMILY HISTORY: Family History  Adopted: Yes  Problem Relation Age of Onset   Heart failure Mother    Breast cancer Mother    Diabetes Mother    Lung cancer Father     SOCIAL HISTORY:  Social History   Socioeconomic History   Marital status: Married    Spouse name: Not on file   Number of children: Not on file   Years of education: Not on file   Highest education level: Not on file  Occupational History   Not on file  Tobacco Use   Smoking status: Former    Packs/day: 1.00    Years: 27.00    Additional pack years: 0.00    Total pack years: 27.00    Types:  Cigarettes    Start date: 06/15/1983    Quit date: 06/21/2010    Years since quitting: 12.4   Smokeless tobacco: Former    Quit date: 06/13/2010  Vaping Use   Vaping Use: Never used  Substance and Sexual Activity   Alcohol use: No    Comment: hx of none in 7-8 years   Drug use: No   Sexual activity: Never  Other Topics Concern   Not on file  Social History Narrative   Not on file   Social Determinants of Health   Financial Resource Strain: Not on file  Food Insecurity: Not on file  Transportation Needs: Not on file  Physical Activity: Not on file  Stress: Not on file  Social Connections: Not on file  Intimate Partner Violence: Not on file     PHYSICAL EXAM  Vitals:   11/23/22 1246  BP: 103/66  Pulse: 78  Weight: 290 lb (131.5 kg)  Height: 6' (1.829 m)    Body mass index is 39.33 kg/m.   General: The patient is an obese man in no acute distress   Neurologic Exam  Mental status: The patient is alert and oriented x 3 at the time of the examination.  Speech is normal.  He appears to have normal short-term memory and focus and attention.  Cranial nerves: Extraocular movements are full.  Facial strength and sensation was normal.  The trapezius strength is normal.   No obvious hearing deficits are noted.  Motor:  Muscle bulk is normal.   Tone is normal. Strength is  5 / 5 in  all 4 extremities.   Sensory:  Reduced vibration sensation in ankles (75%) and toes (40%) and mild reduced pinprick in toes/feet but normal at ankles.     Coordination: Cerebellar testing reveals good finger-nose-finger  Gait and station: Station is normal.  His gait is normal and tandem gait is minimally wide.  .  Reflexes: Deep tendon reflexes are symmetric and normal bilaterally.        Obstructive sleep apnea  Excessive daytime sleepiness  Type 2 diabetes mellitus with diabetic peripheral angiopathy without gangrene, without long-term current use of insulin (HCC)  Restless leg  syndrome  Pain in both lower extremities   1.   Continue CPAP at +15 cm H2O.   Supplies as needed.   Will be seeing pulmonology/PFT for chronic congestion.    2.   Continue ropinirole for restless leg syndrome.   3.   Continue Lyrica for polyneuropathy and bilateral leg pain.  He had tramadol for another reason and tried a couple pills at night and did better.  I can write a low dose but we don't generally do pain med 's.   Continue pregabalin     4.   He will return to see [place] in 1 year or sooner if there are new or worsening neurologic symptoms.  This visit is part of a comprehensive longitudinal care medical relationship regarding the patients primary diagnosis of polyneuropathy and OSA and related concerns.   Jeannine Pennisi A. Epimenio Foot, MD, PhD, FAAN Certified in Neurology, Clinical Neurophysiology, Sleep Medicine, Pain Medicine and Neuroimaging Director, [facility] at [facility]  [facility] [address] [place], [place] [address] [phone]

## 2022-11-23 NOTE — Addendum Note (Signed)
Addended by: Despina Arias A on: 11/23/2022 05:21 PM   Modules accepted: Level of Service

## 2022-12-07 ENCOUNTER — Ambulatory Visit: Payer: Medicare (Managed Care) | Admitting: Neurology

## 2022-12-08 ENCOUNTER — Ambulatory Visit: Payer: Medicare HMO | Admitting: Pulmonary Disease

## 2022-12-08 ENCOUNTER — Encounter: Payer: Self-pay | Admitting: Pulmonary Disease

## 2022-12-08 VITALS — BP 110/74 | HR 85 | Temp 97.9°F | Ht 72.0 in | Wt 294.0 lb

## 2022-12-08 DIAGNOSIS — R0609 Other forms of dyspnea: Secondary | ICD-10-CM

## 2022-12-08 MED ORDER — ALBUTEROL SULFATE HFA 108 (90 BASE) MCG/ACT IN AERS: 2.0000 | INHALATION_SPRAY | Freq: Four times a day (QID) | RESPIRATORY_TRACT | 6 refills | Status: DC | PRN

## 2022-12-08 MED ORDER — FLUTICASONE-SALMETEROL 500-50 MCG/ACT IN AEPB: 1.0000 | INHALATION_SPRAY | Freq: Two times a day (BID) | RESPIRATORY_TRACT | 3 refills | Status: DC

## 2022-12-08 NOTE — Patient Instructions (Addendum)
Nice to meet you  Based on your prior testing I think asthma could be the culprit, would explain the symptoms you have with the shortness of breath.  To treat this use Advair 1 puff in the morning and 1 puff in the evening.  Rinse your  mouth out with water and spit after every use.  Use albuterol 2 puffs every 4-6 hours as needed for chest tightness or shortness of breath.  This is a rescue inhaler.  Please let me know if the inhaler is too expensive.  Please let me know and help in any way in the coming weeks.  Regardless try different treatment in the interim.  Return to clinic in 3 months or sooner if needed with Dr. Judeth Horn

## 2022-12-08 NOTE — Progress Notes (Unsigned)
@Patient  ID: Stephen Franklin. [place], male    DOB: 08/03/1965, 57 y.o.   MRN: [id number]  Chief Complaint  Patient presents with   Consult    SOB,DOE  Cough 2 months  On CPAP  CXR 10/05/22    Referring provider: Carlos Levering, NP  HPI:   PMH:  Smoker/ Smoking History:  Maintenance:   Pt of:   12/08/2022  - Visit     Questionaires / Pulmonary Flowsheets:   ACT:      No data to display          MMRC:     No data to display          Epworth:     09/23/2021    3:00 PM  Results of the Epworth flowsheet  Sitting and reading 1  Watching TV 2  Sitting, inactive in a public place (e.g. a theatre or a meeting) 0  As a passenger in a car for an hour without a break 0  Lying down to rest in the afternoon when circumstances permit 3  Sitting and talking to someone 0  Sitting quietly after a lunch without alcohol 2  In a car, while stopped for a few minutes in traffic 0  Total score 8    Tests:   FENO:  No results found for: "NITRICOXIDE"  PFT:    Latest Ref Rng & Units 09/30/2021    2:03 PM 05/15/2020    3:02 PM  PFT Results  FVC-Pre L 4.00  3.66   FVC-Predicted Pre % 79  72   FVC-Post L 3.97  3.61   FVC-Predicted Post % 78  71   Pre FEV1/FVC % % 79  77   Post FEV1/FCV % % 82  83   FEV1-Pre L 3.18  2.83   FEV1-Predicted Pre % 82  73   FEV1-Post L 3.27  2.98   DLCO uncorrected ml/min/mmHg 33.69  34.08   DLCO UNC% % 116  116   DLCO corrected ml/min/mmHg 33.69  34.08   DLCO COR %Predicted % 116  116   DLVA Predicted % 127  136   TLC L 6.90  6.68   TLC % Predicted % 96  92   RV % Predicted % 131  108     WALK:      No data to display          Imaging: LONG TERM MONITOR (3-14 DAYS)  Result Date: 11/10/2022   The dominant rhythm is normal sinus rhythm with normal circadian variation.  First-degree AV block is present.  There is some degree of heart rate blunting likely related to chronic beta-blocker use.   There are very rare atrial  premature beats and there is no evidence of complex atrial arrhythmia or atrial fibrillation.   There are very rare premature ventricular contractions although these do occur at times in a pattern of bigeminy or trigeminy.   There is no evidence of severe bradycardia, pauses or high-grade AV blocks.   There is good correlation with me in the patient's symptoms and the current set of premature ventricular contractions. Mild abnormalities on arrhythmia monitor. The patient's symptoms correlate with rare premature ventricular contractions. There is evidence of mild beta-blocker related chronotropic incompetence. Patch Wear Time:  1 days and 12 hours (2024-05-20T16:35:03-399 to 2024-05-22T04:53:25-0400) Patient had a min HR of 63 bpm, max HR of 104 bpm, and avg HR of 84 bpm. Predominant underlying rhythm was Sinus Rhythm. Slight P wave morphology changes  were noted. First Degree AV Block was present. Isolated SVEs were rare (<1.0%), and no SVE Couplets or SVE Triplets were present. Isolated VEs were rare (<1.0%, 481), VE Triplets were rare (<1.0%, 1), and no VE Couplets were present. Ventricular Bigeminy and Trigeminy were present. Inverted QRS complexes possibly due to inverted placement of device.    Lab Results:  CBC    Component Value Date/Time   WBC 5.8 10/20/2021 1510   RBC 5.11 10/20/2021 1510   HGB 16.6 10/20/2021 1510   HCT 50.2 10/20/2021 1510   PLT 161.0 10/20/2021 1510   MCV 98.3 10/20/2021 1510   MCH 30.1 09/12/2018 0604   MCHC 33.1 10/20/2021 1510   RDW 15.1 10/20/2021 1510   LYMPHSABS 1.7 10/20/2021 1510   MONOABS 0.4 10/20/2021 1510   EOSABS 0.1 10/20/2021 1510   BASOSABS 0.1 10/20/2021 1510    BMET    Component Value Date/Time   NA 137 10/20/2021 1510   K 4.1 10/20/2021 1510   CL 107 10/20/2021 1510   CO2 23 10/20/2021 1510   GLUCOSE 131 (H) 10/20/2021 1510   BUN 10 10/20/2021 1510   CREATININE 1.09 10/20/2021 1510   CALCIUM 9.0 10/20/2021 1510   GFRNONAA >60 09/12/2018  0604   GFRAA >60 09/12/2018 0604    BNP No results found for: "BNP"  ProBNP    Component Value Date/Time   PROBNP 14.0 10/20/2021 1510    Specialty Problems       Pulmonary Problems   Obstructive sleep apnea    Qualifier: Diagnosis of  By: Thad Ranger LPN, Megan        Dyspnea    Allergies  Allergen Reactions   Epinephrine Shortness Of Breath    Shortness of breath and face swells   Naproxen Sodium Hives, Shortness Of Breath and Swelling   Bee Venom Itching    Immunization History  Administered Date(s) Administered   Influenza Split 04/01/2017, 03/13/2018, 02/19/2019   Influenza,inj,Quad PF,6+ Mos 03/29/2014, 03/14/2018, 03/01/2019, 02/19/2020, 02/17/2022   Influenza,inj,Quad PF,6-35 Mos 03/01/2019   Influenza-Unspecified 03/13/2013   PFIZER Comirnaty(Gray Top)Covid-19 Tri-Sucrose Vaccine 08/24/2019, 09/14/2019, 05/14/2020, 11/27/2020   Pfizer Covid-19 Vaccine Bivalent Booster 24yrs & up 03/18/2021   Pneumococcal Conjugate-13 03/21/2019   Pneumococcal Polysaccharide-23 03/14/2018   Td 06/14/2006   Tdap 11/02/2006, 08/25/2016, 02/26/2021    Past Medical History:  Diagnosis Date   Anxiety    Diabetes mellitus without complication (HCC)    Type 2   Dysrhythmia    palpitations   GERD (gastroesophageal reflux disease)    High cholesterol    History of kidney stones    Hypothyroidism    hx of correcte no meds now   Sleep apnea    cpap    Tobacco History: Social History   Tobacco Use  Smoking Status Former   Packs/day: 1.00   Years: 27.00   Additional pack years: 0.00   Total pack years: 27.00   Types: Cigarettes   Start date: 06/15/1983   Quit date: 06/21/2010   Years since quitting: 12.4  Smokeless Tobacco Former   Quit date: 06/13/2010   Counseling given: Not Answered   Continue to not smoke  Outpatient Encounter Medications as of 12/08/2022  Medication Sig   Armodafinil 200 MG TABS Take 200 mg by mouth daily in the afternoon.   aspirin EC  81 MG tablet Take 81 mg by mouth daily. Swallow whole.   bisoprolol (ZEBETA) 10 MG tablet Take 1 tablet (10 mg total) by mouth daily.  Blood Glucose Monitoring Suppl (TGT BLOOD GLUCOSE MONITORING) w/Device KIT Inject into the skin daily in the afternoon.   buPROPion (WELLBUTRIN XL) 150 MG 24 hr tablet Take 150 mg by mouth daily.   clonazePAM (KLONOPIN) 1 MG tablet Take 1 mg by mouth 3 (three) times daily as needed (for anxiety).   Dulaglutide (TRULICITY) 0.75 MG/0.5ML SOPN Inject into the skin once a week.   fenofibrate 160 MG tablet Take 160 mg by mouth daily with breakfast.    fluticasone-salmeterol (ADVAIR) 500-50 MCG/ACT AEPB Inhale 1 puff into the lungs in the morning and at bedtime.   glucose blood (CONTOUR TEST) test strip 1 each by Other route as needed.   JARDIANCE 10 MG TABS tablet Take 1 tablet by mouth daily.   nitroGLYCERIN (NITROSTAT) 0.4 MG SL tablet Place 1 tablet (0.4 mg total) under the tongue every 5 (five) minutes as needed for chest pain.   omega-3 acid ethyl esters (LOVAZA) 1 g capsule Take 1 capsule (1 g total) by mouth 2 (two) times daily. Patient must schedule appointment for future refills second attempt   omeprazole (PRILOSEC) 40 MG capsule Take 40 mg by mouth daily before breakfast.    pregabalin (LYRICA) 75 MG capsule Take 75 mg by mouth 2 (two) times daily. Two tablets twice daily   rOPINIRole (REQUIP) 1 MG tablet TAKE 1 TABLET BY MOUTH THREE TIMES DAILY .   rosuvastatin (CRESTOR) 10 MG tablet Take 1 tablet (10 mg total) by mouth daily.   sertraline (ZOLOFT) 100 MG tablet Take 150 mg by mouth daily.    testosterone cypionate (DEPOTESTOSTERONE CYPIONATE) 200 MG/ML injection Inject 1 mL into the muscle every 21 ( twenty-one) days.    traMADol (ULTRAM) 50 MG tablet Take 1 tablet (50 mg total) by mouth at bedtime as needed.   No facility-administered encounter medications on file as of 12/08/2022.     Review of Systems  Review of Systems   Physical Exam  BP  110/74 (BP Location: Left Arm, Patient Position: Sitting, Cuff Size: Large)   Pulse 85   Temp 97.9 F (36.6 C) (Temporal)   Ht 6' (1.829 m)   Wt 294 lb (133.4 kg)   SpO2 97%   BMI 39.87 kg/m   Wt Readings from Last 5 Encounters:  12/08/22 294 lb (133.4 kg)  11/23/22 290 lb (131.5 kg)  10/29/22 287 lb 9.6 oz (130.5 kg)  08/30/22 293 lb 6.4 oz (133.1 kg)  12/09/21 287 lb 8 oz (130.4 kg)    BMI Readings from Last 5 Encounters:  12/08/22 39.87 kg/m  11/23/22 39.33 kg/m  10/29/22 39.01 kg/m  08/30/22 39.79 kg/m  12/09/21 38.99 kg/m     Physical Exam    Assessment & Plan:   Dyspnea on exertion: Intermittent but more likely present over the last couple of months.  Previous chest imaging clear with no ILD.  Previous PFTs largely reassuring with the exception of air trapping.  Suspect this is a driver of his symptoms.  See below.  Asthma: Clinical diagnosis with chest congestion, dyspnea on exertion.  PFTs with air trapping and elevated DLCO which can be suggestive.  Trial high-dose Advair discus 1 puff twice daily.   Return in about 3 months (around 03/10/2023).   Karren Burly, MD 12/08/2022   This appointment required 40 minutes of patient care (this includes precharting, chart review, review of results, face-to-face care, etc.).

## 2022-12-14 ENCOUNTER — Other Ambulatory Visit: Payer: Self-pay | Admitting: Neurology

## 2022-12-14 ENCOUNTER — Telehealth: Payer: Self-pay | Admitting: Neurology

## 2022-12-14 MED ORDER — TRAMADOL HCL 50 MG PO TABS: 50.0000 mg | ORAL_TABLET | Freq: Every evening | ORAL | 1 refills | Status: DC | PRN

## 2022-12-14 NOTE — Telephone Encounter (Signed)
See telephone note on 12/14/22

## 2022-12-14 NOTE — Addendum Note (Signed)
Addended by: Asa Lente on: 12/14/2022 05:18 PM   Modules accepted: Orders

## 2022-12-14 NOTE — Telephone Encounter (Signed)
Pt called stated he needs to talk to nurse about getting some medication.

## 2022-12-14 NOTE — Telephone Encounter (Addendum)
I called to speak patient about what medication he is needed. Pt was seen on 11/23/22 and prescribed ultram 50 mg tablet your prescribed #30 tablets, but the pharmacy only gave him #7 tablets states the 1st dose has to be a 7 day supply he was told to contact you to get the #30 tablet. I did call [facility] and confirm that only #7 tablets was picked up. The Rx that was sent on 11/23/22 for #30 was canceled out so a new Rx will need to be sent. Rx is pending to be signed   Patient also said that her neuropathy is getting worse and states you told him to reach out to you? He takes pregabalin 75 mg tablet 2 tablets BID daily and no relief. This medication was prescribed by his diabetes doctor Dr.Smith, pt said his diabetes is well controlled now.    Please advise

## 2022-12-14 NOTE — Addendum Note (Signed)
Addended by: Aura Camps on: 12/14/2022 04:11 PM   Modules accepted: Orders

## 2022-12-20 NOTE — Progress Notes (Deleted)
Cardiology Clinic Note   Date: 12/20/2022 ID: Stephen Franklin, Stephen Franklin, Stephen Franklin, Stephen Franklin  Primary Cardiologist:  Thurmon Fair, MD  Patient Profile    Stephen Franklin is a 57 y.o. male who presents to the clinic today for ***    Past medical history significant for: Nonobstructive CAD. LHC 09/12/2018 (unstable angina): Ostial D1-D1 45%.  Nonobstructive CAD.  Normal LV function.  Normal LVEDP. Echo 10/28/2021: EF 55 to 60%.  Trivial MR.  Mild dilatation of aortic root 38 mm. Exercise stress test 11/04/2022: No ST deviation noted. Occasional PVCs. ECG was not diagnostic due to failure to achieve 85% MAPHR. Patient exercised longer with higher workload when compared to previous test 2018.  Hypertension. Hyperlipidemia. Lipid panel 08/12/2022: LDL 88, HDL 30, TG 232, total 135. Palpitations. Franklin day ZIO 11/10/2022: Dominant rhytym is normal sinus. First degree AV block is present. Mild degree of heart rate blunting likely related to chronic beta blocker use. Very rare PACs. Rare PVCs occurring in bigeminy and trigeminy. Good correlation in patient's symptoms and PVCs.  OSA. T2DM. GERD. Asthma.     History of Present Illness    Stephen Franklin was first evaluated by Dr. Royann Shivers 01/22/2013 for palpitations and shortness of breath.  He continues to be followed for the above outlined history.   Patient was last seen in the office by me on 10/29/2022 with complaints of increased palpitations, DOE and chest pain. Palpitations were described as occurring with greater frequency, increased intensity and lasting longer than previously. He reported associated facial flushing and breathlessness with palpitations. He also reports DOE with increased intensity based on activity that resolved with rest. He described episodic central chest pain with and without exertion lasting 3-4 seconds. Zio showed symptomatic occasional PVCs. Exercise stress test was nondiagnostic, however patient exercised  longer and with higher workload than previous test 2018. Patient was evaluated by pulmonology and determined to have asthma per PFTs showing air trapping. He was tried on Advair.   Today, patient ***  Advair? Lipid panel?    Nonobstructive CAD/DOE.  LHC March 2020 showed mild nonobstructive CAD.  Echo May 2023 showed normal LV function.  Exercise stress test May 2024 non-diagnostic but patient was able to exercise longer with higher workload than previous study 2018. He reports *** Continue aspirin, bisoprolol, Crestor, prn SL NTG. Hypertension. BP today ***.  Patient denies headaches, dizziness or vision changes. Continue bisoprolol.  Hyperlipidemia.  LDL February 2024 88, not at goal. Continue Crestor. Repeat lipid panel *** Palpitations/frequent PVCs.  Event monitor May 2024 showed occasional symptomatic PVCs.  Patient *** Continue bisoprolol.    ROS: All other systems reviewed and are otherwise negative except as noted in History of Present Illness.  Studies Reviewed       ***  Risk Assessment/Calculations    {Does this patient have ATRIAL FIBRILLATION?[phone]} No BP recorded.  {Refresh Note OR Click here to enter BP  :1}***        Physical Exam    VS:  There were no vitals taken for this visit. , BMI There is no height or weight on file to calculate BMI.  GEN: Well nourished, well developed, in no acute distress. Neck: No JVD or carotid bruits. Cardiac: *** RRR. No murmurs. No rubs or gallops.   Respiratory:  Respirations regular and unlabored. Clear to auscultation without rales, wheezing or rhonchi. GI: Soft, nontender, nondistended. Extremities: Radials/DP/PT 2+ and equal bilaterally. No clubbing or cyanosis. No edema ***  Skin: Warm  and dry, no rash. Neuro: Strength intact.  Assessment & Plan   ***  Disposition: ***     {Are you ordering a CV Procedure (e.g. stress test, cath, DCCV, TEE, etc)?   Press F2        :[id number]}   Signed, Etta Grandchild. Laurice Kimmons,  DNP, NP-C

## 2022-12-22 ENCOUNTER — Encounter: Payer: Self-pay | Admitting: Student

## 2022-12-22 ENCOUNTER — Ambulatory Visit: Payer: Medicare HMO | Attending: Student | Admitting: Student

## 2023-03-04 ENCOUNTER — Ambulatory Visit: Payer: Medicare HMO | Attending: Cardiovascular Disease | Admitting: Cardiovascular Disease

## 2023-03-08 ENCOUNTER — Ambulatory Visit: Payer: Medicare HMO | Admitting: Pulmonary Disease

## 2023-03-27 ENCOUNTER — Other Ambulatory Visit: Payer: Self-pay | Admitting: Cardiovascular Disease

## 2023-03-29 ENCOUNTER — Other Ambulatory Visit: Payer: Self-pay | Admitting: Pulmonary Disease

## 2023-06-16 ENCOUNTER — Other Ambulatory Visit: Payer: Self-pay | Admitting: Neurology

## 2023-06-22 ENCOUNTER — Ambulatory Visit: Payer: Medicare HMO | Admitting: Primary Care

## 2023-06-22 NOTE — Progress Notes (Deleted)
 @Patient  ID: Stephen Franklin, male    DOB: July 31, 1965, 58 y.o.   MRN: [id number]  No chief complaint on file.   Referring provider: Jerrye Lamar CHRISTELLA Mickey., MD  HPI: 58 year old male, former smoker quit in 2012 (27-pack-year history).  Past medical history significant for obstructive sleep apnea.  Former patient of Dr. Jude, seen on 09/23/21 for sleep consult. Patient saw Dr. Annella in June 2024 for evaluation dyspnea.   Previous [facility] pulmonary encounter:  10/20/2021 Patient presents today for follow-up. He was originally seen on 09/23/21 for sleep consult. He was referred by Dr. Vear, who manages his sleep apnea currently. He is here today for evaluation of his dyspnea symptoms which started in 2019 after using recalled philip's CPAP machine. He used this machine for approximately 3 years between 2018-2020. PFTs in 2021 showed mild restriction and increased diffusion capacity. Repeat function function testing today showed improvement in lung function compared to 2021. CXR showed coarsened interstitial markings, no airspace disease. He had a CT abd in 2013 did show mild subpleural reticulation/dependent atelectasis at lung bases. Recommend checking HRCT to rule out ILD. HRCT on 10/14/21 showed no evidence of fibrotic ILD, minimal bland appearing bandlike scarring at the lung bases. He is compliant with CPAP, managed by Dr. Vear. Need ONO on CPAP to assess for oxygen need. He reports an episode of right shoulder pain with associated heart flutter Sunday. He has  been referred to cardiology for ongoing dyspnea.  12/08/22- Dr. Annella  58 y.o. man whom we are seeing for evaluation of shortness of breath and cough.  Previously followed in pulmonary clinic.  Most recent pulmonary note x 2 reviewed.  Most recent PCP note reviewed.  Cough present for 2 to 3 months now.  Associated shortness of breath.  Congestion in his chest like phlegm but often cough is dry.  Some difficulty getting a deep breath,  chest tightness at times.  No time of day when things are better or worse.  No position to make things better or worse.  No seasonal environmental factors that make things better or worse.  Reviewed prior PFTs most recently 2023 that on my review interpretation reveals no obstruction, air trapping present on lung volumes, with elevated DLCO.  Discussed given his symptoms as well as prior PFTs, asthma would be a unifying diagnosis.   06/22/2023 - Interim  Patient presents today for routine follow-up. He would like to discuss getting updated breathing test due to symptoms of dyspnea.          Pulmonary function testing: 05/15/20 >> FVC 3.61 (71%), FEV1 2.98 (77%), ratio 83, TLC 92%, DLCO unc 34.08 (116%)  09/30/21 >> FVC 3.97 (78%), FEV1 3.27 (85%), ratio 82, TLC 96%, DLCOunc 33.69 (116%)   Allergies  Allergen Reactions   Epinephrine  Shortness Of Breath    Shortness of breath and face swells   Naproxen Sodium Hives, Shortness Of Breath and Swelling   Bee Venom Itching    Immunization History  Administered Date(s) Administered   Influenza Split 04/01/2017, 03/13/2018, 02/19/2019   Influenza,inj,Quad PF,6+ Mos 03/29/2014, 03/14/2018, 03/01/2019, 02/19/2020, 02/17/2022   Influenza,inj,Quad PF,6-35 Mos 03/01/2019   Influenza-Unspecified 03/13/2013   PFIZER Comirnaty(Gray Top)Covid-19 Tri-Sucrose Vaccine 08/24/2019, 09/14/2019, 05/14/2020, 11/27/2020   Pfizer Covid-19 Vaccine Bivalent Booster 72yrs & up 03/18/2021   Pneumococcal Conjugate-13 03/21/2019   Pneumococcal Polysaccharide-23 03/14/2018   Td 06/14/2006   Tdap 11/02/2006, 08/25/2016, 02/26/2021    Past Medical History:  Diagnosis Date   Anxiety  Diabetes mellitus without complication (HCC)    Type 2   Dysrhythmia    palpitations   GERD (gastroesophageal reflux disease)    High cholesterol    History of kidney stones    Hypothyroidism    hx of correcte no meds now   Sleep apnea    cpap    Tobacco  History: Social History   Tobacco Use  Smoking Status Former   Current packs/day: 0.00   Average packs/day: 1 pack/day for 27.0 years (27.0 ttl pk-yrs)   Types: Cigarettes   Start date: 06/15/1983   Quit date: 06/21/2010   Years since quitting: 13.0  Smokeless Tobacco Former   Quit date: 06/13/2010   Counseling given: Not Answered   Outpatient Medications Prior to Visit  Medication Sig Dispense Refill   albuterol  (VENTOLIN  HFA) 108 (90 Base) MCG/ACT inhaler Inhale 2 puffs into the lungs every 6 (six) hours as needed for wheezing or shortness of breath. 8 g 6   Armodafinil  200 MG TABS Take 200 mg by mouth daily in the afternoon.     aspirin  EC 81 MG tablet Take 81 mg by mouth daily. Swallow whole.     bisoprolol  (ZEBETA ) 10 MG tablet Take 1 tablet (10 mg total) by mouth daily. 90 tablet 3   bisoprolol  (ZEBETA ) 5 MG tablet Take 1 tablet by mouth once daily 90 tablet 2   Blood Glucose Monitoring Suppl (TGT BLOOD GLUCOSE MONITORING) w/Device KIT Inject into the skin daily in the afternoon.     buPROPion  (WELLBUTRIN  XL) 150 MG 24 hr tablet Take 150 mg by mouth daily.     clonazePAM  (KLONOPIN ) 1 MG tablet Take 1 mg by mouth 3 (three) times daily as needed (for anxiety).     Dulaglutide (TRULICITY) 0.75 MG/0.5ML SOPN Inject into the skin once a week.     fenofibrate 160 MG tablet Take 160 mg by mouth daily with breakfast.   3   fluticasone -salmeterol (ADVAIR) 500-50 MCG/ACT AEPB INHALE 1 DOSE BY MOUTH IN THE MORNING AND AT BEDTIME 60 each 11   glucose blood (CONTOUR TEST) test strip 1 each by Other route as needed.     JARDIANCE 10 MG TABS tablet Take 1 tablet by mouth daily.     nitroGLYCERIN  (NITROSTAT ) 0.4 MG SL tablet Place 1 tablet (0.4 mg total) under the tongue every 5 (five) minutes as needed for chest pain. 25 tablet 2   omega-3 acid ethyl esters (LOVAZA ) 1 g capsule Take 1 capsule (1 g total) by mouth 2 (two) times daily. Patient must schedule appointment for future refills second  attempt 30 capsule 0   omeprazole (PRILOSEC) 40 MG capsule Take 40 mg by mouth daily before breakfast.      pregabalin (LYRICA) 75 MG capsule Take 75 mg by mouth 2 (two) times daily. Two tablets twice daily     rOPINIRole  (REQUIP ) 1 MG tablet TAKE 1 TABLET BY MOUTH THREE TIMES DAILY . 270 tablet 4   rosuvastatin  (CRESTOR ) 10 MG tablet Take 1 tablet (10 mg total) by mouth daily. 90 tablet 3   sertraline  (ZOLOFT ) 100 MG tablet Take 150 mg by mouth daily.      testosterone  cypionate (DEPOTESTOSTERONE CYPIONATE) 200 MG/ML injection Inject 1 mL into the muscle every 21 ( twenty-one) days.      traMADol  (ULTRAM ) 50 MG tablet TAKE 1 TABLET BY MOUTH AT BEDTIME AS NEEDED 7 tablet 0   No facility-administered medications prior to visit.      Review of  Systems  Review of Systems   Physical Exam  There were no vitals taken for this visit. Physical Exam   Lab Results:  CBC    Component Value Date/Time   WBC 5.8 10/20/2021 1510   RBC 5.11 10/20/2021 1510   HGB 16.6 10/20/2021 1510   HCT 50.2 10/20/2021 1510   PLT 161.0 10/20/2021 1510   MCV 98.3 10/20/2021 1510   MCH 30.1 09/12/2018 0604   MCHC 33.1 10/20/2021 1510   RDW 15.1 10/20/2021 1510   LYMPHSABS 1.7 10/20/2021 1510   MONOABS 0.4 10/20/2021 1510   EOSABS 0.1 10/20/2021 1510   BASOSABS 0.1 10/20/2021 1510    BMET    Component Value Date/Time   NA 137 10/20/2021 1510   K 4.1 10/20/2021 1510   CL 107 10/20/2021 1510   CO2 23 10/20/2021 1510   GLUCOSE 131 (H) 10/20/2021 1510   BUN 10 10/20/2021 1510   CREATININE 1.09 10/20/2021 1510   CALCIUM  9.0 10/20/2021 1510   GFRNONAA >60 09/12/2018 0604   GFRAA >60 09/12/2018 0604    BNP No results found for: BNP  ProBNP    Component Value Date/Time   PROBNP 14.0 10/20/2021 1510    Imaging: No results found.   Assessment & Plan:   No problem-specific Assessment & Plan notes found for this encounter.     Almarie LELON Ferrari, NP 06/22/2023

## 2023-07-06 ENCOUNTER — Ambulatory Visit: Payer: Medicare Other | Admitting: Internal Medicine

## 2023-07-11 ENCOUNTER — Other Ambulatory Visit: Payer: Self-pay | Admitting: Pulmonary Disease

## 2023-07-14 ENCOUNTER — Telehealth: Payer: Self-pay | Admitting: Cardiovascular Disease

## 2023-07-14 NOTE — Telephone Encounter (Signed)
Patient identification verified by 2 forms. Marilynn Rail, RN    Called and spoke to patient  Patient states:   -having some elevated BP   -1/30 4-5 hours after medication BP  125/95  -Takes takes Bisoprolol 5mg  once daily   -blood pressure is fluctuating, does not typically do that   -does not have BP log for the week  Patient denies:   -persistent headache   -chest pressure/chest pain   -SOB/difficulty breathing  Advised patient:   -keep 2/7 OV for follow up   -keep BP log, check BP 1-2 hours after medication and review at OV  Reviewed ED warning signs/precautions Patient verbalized understanding, no questions at this time

## 2023-07-14 NOTE — Telephone Encounter (Signed)
Pt c/o BP issue: STAT if pt c/o blurred vision, one-sided weakness or slurred speech  1. What are your last 5 BP readings?   Ranging in 130's / 90's   2. Are you having any other symptoms (ex. Dizziness, headache, blurred vision, passed out)? No   3. What is your BP issue? Pt states his bp has been high for about 3 days, please advise.

## 2023-07-19 DIAGNOSIS — M1811 Unilateral primary osteoarthritis of first carpometacarpal joint, right hand: Secondary | ICD-10-CM | POA: Insufficient documentation

## 2023-07-19 DIAGNOSIS — M19041 Primary osteoarthritis, right hand: Secondary | ICD-10-CM | POA: Insufficient documentation

## 2023-07-19 NOTE — Progress Notes (Deleted)
 Cardiology Office Note:  .   Date:  07/19/2023  ID:  Stephen Franklin. Stephen, Franklin May 13, 1966, MRN [id number] PCP: Jerrye Lamar CHRISTELLA Mickey., MD  [facility] Providers Cardiologist:  Jerel Balding, MD  History of Present Illness: .   Stephen Franklin. Stoiber is a 58 y.o. male with a past medical history of nonobstructive CAD, HTN, HLD, palpitations, OSA, tyoe 2 DM, GERD. She is followed by Dr. Balding and presents today for a follow up appointment  Patient previously underwent cardiac catheterization in 08/2018 for evaluation of unstable angina. Was found to have 45% stenosis in ostial D1, normal LV systolic function with EF 55-65%, normal LVEDP. Patient also wore a cardiac monitor in 10/2018 for evaluation of palpitations that showed frequent PVCs (13% burden). Suspected that palpitations were due to PVCs, and bisoprolol  was increased to 10 mg daily. Echocardiogram in 10/2021 showed EF 55-60%, no regional wall motion abnormalities, normal RV systolic function, mild dilatation of the aortic root measuring 38 mm.   Patient was last seen by cardiology on 12/29/22. At that time, patient reported increased frequency of palpitations. Also reported constant dyspnea on exertion. He underwent ETT on 11/04/22 that was not diagnostic due to failure to achieve 85% MAPHR. However, he was able to exercise for a longer time and at a higher workload than he did during a similar test 5 years ago. Cardiac monitor in 10/2022 showed normal sinus rhythm with rare PACs, rare PVCs. Although PVCs were rare, patient felt symptoms with most of them. He was on bisoprolol  to suppress the extra beats, and his PVC burden was not high enough to justify more aggressive therapy.   PVCs Palpitations  - Previously had PVC burden of 13% in 2020. Monitor from 10/2022 showed only rare PVCs, but symptoms clearly correlated with PVCs  - Continue bisoprolol  5 mg daily   Nonobstructive CAD  - Patient previously underwent cardiac catheterization in 2020  that showed 45% stenosis in D1 -  - Continue ASA 81 mg daily  - Continue crestor  10 mg daily  - Continue bisoprolol  5 mg daily   HTN  - BP well controlled on current medications  - Continue bisoprolol  5 mg daily   HLD  - LDL 88 in 07/2022  - Now on crestor  10 mg daily  - Repeat LFTs, Lipid panel ***   Dyspnea on Exertion  Asthma  - Seen by pulmonology in 11/2022. Clinically diagnosed with asthma due to chest congestion, dyspnea on exertion, PFTs with air trapping, elevated DLCO. Started on advair  - Echo from 10/2021 showed EF 55-60%, no regional wall motion abnormalities, normal RV function   ROS: ***  Studies Reviewed: .        *** Risk Assessment/Calculations:   {Does this patient have ATRIAL FIBRILLATION?:[phone]} No BP recorded.  {Refresh Note OR Click here to enter BP  :1}***       Physical Exam:   VS:  There were no vitals taken for this visit.   Wt Readings from Last 3 Encounters:  12/08/22 294 lb (133.4 kg)  11/23/22 290 lb (131.5 kg)  10/29/22 287 lb 9.6 oz (130.5 kg)    GEN: Well nourished, well developed in no acute distress NECK: No JVD; No carotid bruits CARDIAC: ***RRR, no murmurs, rubs, gallops RESPIRATORY:  Clear to auscultation without rales, wheezing or rhonchi  ABDOMEN: Soft, non-tender, non-distended EXTREMITIES:  No edema; No deformity   ASSESSMENT AND PLAN: .   ***    {Are you ordering a CV  Procedure (e.g. stress test, cath, DCCV, TEE, etc)?   Press F2        :[id number]}  Dispo: ***  Signed, Rollo FABIENE Louder, PA-C

## 2023-07-22 ENCOUNTER — Ambulatory Visit: Payer: Medicare Other | Attending: Cardiology | Admitting: Cardiology

## 2023-08-22 ENCOUNTER — Encounter: Payer: Self-pay | Admitting: Primary Care

## 2023-08-22 ENCOUNTER — Ambulatory Visit: Payer: Medicare Other | Admitting: Primary Care

## 2023-09-13 HISTORY — PX: KNEE SURGERY: SHX244

## 2023-09-26 DIAGNOSIS — S83242A Other tear of medial meniscus, current injury, left knee, initial encounter: Secondary | ICD-10-CM | POA: Insufficient documentation

## 2023-10-04 ENCOUNTER — Encounter: Payer: Self-pay | Admitting: Cardiovascular Disease

## 2023-10-04 ENCOUNTER — Telehealth: Payer: Self-pay

## 2023-10-04 NOTE — Telephone Encounter (Signed)
 I left a message for the patient to call our office to schedule an office visit for pre-op. There is a available office visit with Dr. Loetta Ringer on 4/23 at 8 am.

## 2023-10-04 NOTE — Telephone Encounter (Signed)
   Name: Stephen Franklin  DOB: 1965-12-07  MRN: [id number]  Primary Cardiologist: Luana Rumple, MD  Chart reviewed as part of pre-operative protocol coverage. Because of Stephen Franklin. Stephen Franklin past medical history and time since last visit, he will require a follow-up in-office visit in order to better assess preoperative cardiovascular risk. Pt is overdue for follow-up.   Pre-op covering staff: - Please schedule appointment and call patient to inform them. If patient already had an upcoming appointment within acceptable timeframe, please add "pre-op clearance" to the appointment notes so provider is aware. - Please contact requesting surgeon's office via preferred method (i.e, phone, fax) to inform them of need for appointment prior to surgery.  Jude Norton, NP  10/04/2023, 11:22 AM

## 2023-10-04 NOTE — Telephone Encounter (Signed)
   Pre-operative Risk Assessment    Patient Name: Stephen Franklin  DOB: [birth year] MRN: [id number]   Date of last office visit: 10/29/22 Morey Ar, NP Date of next office visit: NONE   Request for Surgical Clearance    Procedure:   LEFT KNEE ARTHROSCOPY  Date of Surgery:  Clearance 10/10/23  (STAT)                                Surgeon:  DR Fay Hoop Group or Practice Name:  [facility] [place] Phone number:  [phone] Fax number:  [phone]   ATTN: Antone Kiss   Type of Clearance Requested:   - Medical  - Pharmacy:  Hold Aspirin      Type of Anesthesia:  Not Indicated   Additional requests/questions:    SignedCollin Deal   10/04/2023, 10:47 AM

## 2023-10-05 ENCOUNTER — Telehealth: Payer: Self-pay | Admitting: *Deleted

## 2023-10-05 NOTE — Progress Notes (Unsigned)
 Cardiology Office Note:    Date:  10/06/2023   ID:  Stephen Franklin, Stephen Franklin 1966-01-05, MRN [id number]  PCP:  Janese Medicine., MD   [facility] Providers Cardiologist:  Luana Rumple, MD     Referring MD: Janese Medicine., MD   CC: follow up for CAD  History of Present Illness:    Stephen Cazeau. Franklin is a 58 y.o. male with a hx of CAD, OSA, hypothyroidism, DM 2, palpitations, PVCs, ED, RLS.  11/10/2022 monitor average heart rate 84 bpm, predominant rhythm was sinus, first-degree AV block was present, VE's were rare at less than 1% 11/04/2022 ETT no ST deviation was noted, occasional PVCs, ECG was nondiagnostic secondary to failure to achieve target heart rate 10/28/2021 echo EF 55 to 60%, trivial MR, mild dilatation of the aortic root at 38 mm 11/09/2018 monitor predominant rhythm was sinus, frequent PVCs at 13% 09/12/2018 cardiac cath nonobstructive CAD, first diagonal lesion was 45% stenosed  Established with Dr. Alvis Ba in 2014 for the evaluation of shortness of breath and palpitations.  He developed chest pain in 2020, underwent heart catheterization which revealed nonobstructive CAD.  Most recently he was evaluated by Levin Reamer on 10/29/2022, he was bothered by increased frequency with palpitations, monitor was arranged which revealed PVCs and no changes were made to his plan of care.  He also underwent an exercise tolerance test which was nondiagnostic secondary to inability to achieve target heart rate but was overall unchanged from prior ETT.  He presents today for preoperative evaluation for upcoming left knee replacement.  He apparently injured it while he was dancing.  He does not have any formal complaints from a cardiac perspective.  He has been diagnosed with asthma since he was last evaluated in our office, his breathing is somewhat improved for him.  His palpitations are currently quiescent for him. He denies chest pain, palpitations, dyspnea, pnd,  orthopnea, n, v, dizziness, syncope, edema, weight gain, or early satiety.    Past Medical History:  Diagnosis Date   Anxiety    Diabetes mellitus without complication (HCC)    Type 2   Dysrhythmia    palpitations   GERD (gastroesophageal reflux disease)    High cholesterol    History of kidney stones    Hypothyroidism    hx of correcte no meds now   Sleep apnea    cpap    Past Surgical History:  Procedure Laterality Date   COLONOSCOPY     HERNIA REPAIR     Left inguinal, umbilical with mesh 06-23-17 Dr. Elvan Hamel   LEFT HEART CATH AND CORONARY ANGIOGRAPHY N/A 09/12/2018   Procedure: LEFT HEART CATH AND CORONARY ANGIOGRAPHY;  Surgeon: Swaziland, Peter M, MD;  Location: [facility] INVASIVE CV LAB;  Service: Cardiovascular;  Laterality: N/A;   UMBILICAL HERNIA REPAIR N/A 06/23/2017   Procedure: LAPAROSCOPIC ASSISTED UMBILICAL HERNIA REPAIR WITH MESH;  Surgeon: Aldean Hummingbird, MD;  Location: [facility] ORS;  Service: General;  Laterality: N/A;    Current Medications: Current Meds  Medication Sig   albuterol  (VENTOLIN  HFA) 108 (90 Base) MCG/ACT inhaler INHALE 2 PUFFS BY MOUTH EVERY 6 HOURS AS NEEDED FOR WHEEZING FOR SHORTNESS OF BREATH   Armodafinil  200 MG TABS Take 200 mg by mouth daily in the afternoon.   aspirin  EC 81 MG tablet Take 81 mg by mouth daily. Swallow whole.   bisoprolol  (ZEBETA ) 5 MG tablet Take 1 tablet by mouth once daily   Blood Glucose Monitoring Suppl (TGT BLOOD  GLUCOSE MONITORING) w/Device KIT Inject into the skin daily in the afternoon.   buPROPion  (WELLBUTRIN  XL) 150 MG 24 hr tablet Take 150 mg by mouth daily.   clonazePAM  (KLONOPIN ) 1 MG tablet Take 1 mg by mouth 3 (three) times daily as needed (for anxiety).   fenofibrate 160 MG tablet Take 160 mg by mouth daily with breakfast.    fluticasone -salmeterol (ADVAIR) 500-50 MCG/ACT AEPB INHALE 1 DOSE BY MOUTH IN THE MORNING AND AT BEDTIME   glucose blood (CONTOUR TEST) test strip 1 each by Other route as needed.   JARDIANCE 10 MG TABS  tablet Take 1 tablet by mouth daily.   metFORMIN  (GLUCOPHAGE -XR) 500 MG 24 hr tablet Take 1 tablet by mouth 2 (two) times daily.   methocarbamol  (ROBAXIN ) 500 MG tablet Take 500 mg by mouth daily as needed for muscle spasms.   nitroGLYCERIN  (NITROSTAT ) 0.4 MG SL tablet Place 1 tablet (0.4 mg total) under the tongue every 5 (five) minutes as needed for chest pain.   omega-3 acid ethyl esters (LOVAZA ) 1 g capsule Take 1 capsule (1 g total) by mouth 2 (two) times daily. Patient must schedule appointment for future refills second attempt   omeprazole (PRILOSEC) 40 MG capsule Take 40 mg by mouth daily before breakfast.    pilocarpine (SALAGEN) 7.5 MG tablet Take 7.5 mg by mouth daily.   pregabalin (LYRICA) 75 MG capsule Take 75 mg by mouth 2 (two) times daily. Two tablets twice daily   rOPINIRole  (REQUIP ) 1 MG tablet TAKE 1 TABLET BY MOUTH THREE TIMES DAILY .   rosuvastatin  (CRESTOR ) 10 MG tablet Take 1 tablet (10 mg total) by mouth daily.   sertraline  (ZOLOFT ) 100 MG tablet Take 150 mg by mouth daily.    testosterone  cypionate (DEPOTESTOSTERONE CYPIONATE) 200 MG/ML injection Inject 1 mL into the muscle every 21 ( twenty-one) days.    traMADol  (ULTRAM ) 50 MG tablet TAKE 1 TABLET BY MOUTH AT BEDTIME AS NEEDED   TRULICITY 1.5 MG/0.5ML SOAJ Inject 1.5 mg into the skin every 7 (seven) days.   [DISCONTINUED] Dulaglutide (TRULICITY) 0.75 MG/0.5ML SOPN Inject into the skin once a week.     Allergies:   Epinephrine , Naproxen sodium, and Bee venom   Social History   Socioeconomic History   Marital status: Married    Spouse name: Not on file   Number of children: Not on file   Years of education: Not on file   Highest education level: Not on file  Occupational History   Not on file  Tobacco Use   Smoking status: Former    Current packs/day: 0.00    Average packs/day: 1 pack/day for 27.0 years (27.0 ttl pk-yrs)    Types: Cigarettes    Start date: 06/15/1983    Quit date: 06/21/2010    Years since  quitting: 13.3   Smokeless tobacco: Former    Quit date: 06/13/2010  Vaping Use   Vaping status: Never Used  Substance and Sexual Activity   Alcohol use: No    Comment: hx of none in 7-8 years   Drug use: No   Sexual activity: Never  Other Topics Concern   Not on file  Social History Narrative   Not on file   Social Drivers of Health   Financial Resource Strain: Medium Risk (09/22/2023)   Received from [facility]   Overall Financial Resource Strain (CARDIA)    Difficulty of Paying Living Expenses: Somewhat hard  Food Insecurity: Low Risk  (10/05/2023)   Received from  [facility]   Hunger Vital Sign    Worried About Running Out of Food in the Last Year: Never true    Ran Out of Food in the Last Year: Never true  Transportation Needs: No Transportation Needs (10/05/2023)   Received from [facility]   Transportation    In the past 12 months, has lack of reliable transportation kept you from medical appointments, meetings, work or from getting things needed for daily living? : No  Physical Activity: Insufficiently Active (09/22/2023)   Received from [facility]   Exercise Vital Sign    Days of Exercise per Week: 3 days    Minutes of Exercise per Session: 30 min  Stress: No Stress Concern Present (09/22/2023)   Received from [facility] - Occupational Stress Questionnaire    Feeling of Stress : Only a little  Social Connections: Socially Integrated (09/22/2023)   Received from [facility]   Social Network    How would you rate your social network (family, work, friends)?: Good participation with social networks     Family History: The patient's family history includes Breast cancer in his mother; Diabetes in his mother; Heart failure in his mother; Lung cancer in his father. He was adopted.  ROS:   Please see the history of present illness.     All other systems reviewed and are negative.  EKGs/Labs/Other Studies  Reviewed:    The following studies were reviewed today:  10/28/2021 echo complete - EF 55 to 60%, trivial MR, mild dilatation of the aortic root at 38 mm.  11/09/2018 long-term monitor -  The baseline rhythm is normal sinus rhythm with normal circadian variation. There are very frequent PVCs. They make up 13% of all recorded beats and commonly occur in a pattern of quadrigeminy, less often bigeminy. All the PVCs appear to have the same morphology. A single ventricular couplet is seen, but there is no ventricular tachycardia. The patient's symptoms are correlated to periods of increased PVC frequency.   Abnormal event monitor due to symptomatic and very frequent monophasic PVCs. Complex ventricular arrhythmia is not seen and there is no significant atrial arrhythmia  09/12/2018 left heart cath -  Ost 1st Diag to 1st Diag lesion is 45% stenosed. The left ventricular systolic function is normal. LV end diastolic pressure is normal. The left ventricular ejection fraction is 55-65% by visual estimate.   1. Nonobstructive CAD- mild 2. Normal LV function 3. Normal LVEDP   Plan: continue risk factor modification.  EKG:  EKG is  ordered today.  The ekg ordered today demonstrates NSR, HR 79 bpm, consistent with prior EKG tracings.   Recent Labs: No results found for requested labs within last 365 days.  Recent Lipid Panel    Component Value Date/Time   CHOL 174 01/02/2010 1237   TRIG 298.0 (H) 01/02/2010 1237   HDL 30.80 (L) 01/02/2010 1237   CHOLHDL 6 01/02/2010 1237   VLDL 59.6 (H) 01/02/2010 1237   LDLDIRECT 95.1 01/02/2010 1237     Risk Assessment/Calculations:            Physical Exam:    VS:  BP (!) 140/80   Pulse 81   Ht 6' (1.829 m)   Wt 298 lb (135.2 kg)   SpO2 96%   BMI 40.42 kg/m     Wt Readings from Last 3 Encounters:  10/06/23 298 lb (135.2 kg)  12/08/22 294 lb (133.4 kg)  11/23/22 290 lb (131.5  kg)     GEN:  Well nourished, well developed in no acute  distress HEENT: Normal NECK: No JVD; No carotid bruits LYMPHATICS: No lymphadenopathy CARDIAC: RRR, no murmurs, rubs, gallops RESPIRATORY:  Clear to auscultation without rales, wheezing or rhonchi  ABDOMEN: Soft, non-tender, non-distended MUSCULOSKELETAL:  No edema; No deformity  SKIN: Warm and dry NEUROLOGIC:  Alert and oriented x 3 PSYCHIATRIC:  Normal affect   ASSESSMENT:    1. Coronary artery disease involving native coronary artery of native heart with angina pectoris (HCC)   2. Essential hypertension   3. Palpitations   4. OSA (obstructive sleep apnea)   5. Preop cardiovascular exam     PLAN:    In order of problems listed above:  Coronary artery disease -nonobstructive CAD on LHC in 2020. Stable with no anginal symptoms. No indication for ischemic evaluation. Continue bisoprolol , Lovaza , Pravachol , ASA.  Hypertension -blood pressure today is slightly elevated at 140/80 however he is in pain, it is typically well-controlled, continue bisoprolol  5 mg daily.  Palpitations -currently quiescent, continue this problem 5 mg daily.  OSA -compliant with CPAP and this is managed by neurology.  Preoperative cardiovascular evaluation-echo in 2023 revealed normal EF.  Exercise tolerance test last year revealed no changes on his EKG.  According to the Revised Cardiac Risk Index (RCRI), his Perioperative Risk of Major Cardiac Event is (%): 0.4 His Functional Capacity in METs is: 5.07 according to the Duke Activity Status Index (DASI). Therefore, based on ACC/AHA guidelines, patient would be at acceptable risk for the planned procedure without further cardiovascular testing. I will route this recommendation to the requesting party via Epic fax function.  Regarding his aspirin , would prefer this be continued throughout the perioperative process however if deemed to be too high risk per the surgeon this can be held 7 days prior to surgery and resumed as soon as possible.  Disposition  -follow-up with Dr. Alvis Ba 1 year.           Medication Adjustments/Labs and Tests Ordered: Current medicines are reviewed at length with the patient today.  Concerns regarding medicines are outlined above.  Orders Placed This Encounter  Procedures   EKG 12-Lead   No orders of the defined types were placed in this encounter.   Patient Instructions  Medication Instructions:  Your physician recommends that you continue on your current medications as directed. Please refer to the Current Medication list given to you today.  *If you need a refill on your cardiac medications before your next appointment, please call your pharmacy*  Lab Work: None If you have labs (blood work) drawn today and your tests are completely normal, you will receive your results only by: MyChart Message (if you have MyChart) OR A paper copy in the mail If you have any lab test that is abnormal or we need to change your treatment, we will call you to review the results.  Testing/Procedures: None  Follow-Up: At [facility], you and your health needs are our priority.  As part of our continuing mission to provide you with exceptional heart care, our providers are all part of one team.  This team includes your primary Cardiologist (physician) and Advanced Practice Providers or APPs (Physician Assistants and Nurse Practitioners) who all work together to provide you with the care you need, when you need it.  Your next appointment:   1 year(s)  Provider:   Luana Rumple, MD    We recommend signing up for the patient portal called "  MyChart".  Sign up information is provided on this After Visit Summary.  MyChart is used to connect with patients for Virtual Visits (Telemedicine).  Patients are able to view lab/test results, encounter notes, upcoming appointments, etc.  Non-urgent messages can be sent to your provider as well.   To learn more about what you can do with MyChart, go to  [email or web address].   Other Instructions None         Signed, Terrance Ferretti, NP  10/06/2023 11:33 AM    [facility]

## 2023-10-05 NOTE — Telephone Encounter (Signed)
 Spoke to patient about bringing his SD card to his OV tomorrow scheduled with Eulas Hick, NP. He agreed.

## 2023-10-06 ENCOUNTER — Ambulatory Visit: Attending: Cardiology | Admitting: Cardiology

## 2023-10-06 ENCOUNTER — Ambulatory Visit: Admitting: Primary Care

## 2023-10-06 VITALS — BP 140/80 | HR 81 | Ht 72.0 in | Wt 298.0 lb

## 2023-10-06 DIAGNOSIS — F329 Major depressive disorder, single episode, unspecified: Secondary | ICD-10-CM | POA: Insufficient documentation

## 2023-10-06 DIAGNOSIS — E291 Testicular hypofunction: Secondary | ICD-10-CM | POA: Insufficient documentation

## 2023-10-06 DIAGNOSIS — R002 Palpitations: Secondary | ICD-10-CM | POA: Diagnosis not present

## 2023-10-06 DIAGNOSIS — I25119 Atherosclerotic heart disease of native coronary artery with unspecified angina pectoris: Secondary | ICD-10-CM | POA: Diagnosis not present

## 2023-10-06 DIAGNOSIS — I10 Essential (primary) hypertension: Secondary | ICD-10-CM

## 2023-10-06 DIAGNOSIS — K219 Gastro-esophageal reflux disease without esophagitis: Secondary | ICD-10-CM | POA: Insufficient documentation

## 2023-10-06 DIAGNOSIS — Z01810 Encounter for preprocedural cardiovascular examination: Secondary | ICD-10-CM | POA: Diagnosis not present

## 2023-10-06 DIAGNOSIS — R3911 Hesitancy of micturition: Secondary | ICD-10-CM | POA: Insufficient documentation

## 2023-10-06 DIAGNOSIS — E7800 Pure hypercholesterolemia, unspecified: Secondary | ICD-10-CM | POA: Insufficient documentation

## 2023-10-06 DIAGNOSIS — G4733 Obstructive sleep apnea (adult) (pediatric): Secondary | ICD-10-CM

## 2023-10-06 DIAGNOSIS — M5441 Lumbago with sciatica, right side: Secondary | ICD-10-CM | POA: Insufficient documentation

## 2023-10-06 NOTE — Telephone Encounter (Signed)
 Pt is scheduled to see Pattricia Bores, NP, 10/06/23, clearance will be addressed at that time.  Will route back to the requesting surgeon's office to make them aware.

## 2023-10-06 NOTE — Patient Instructions (Signed)
 Medication Instructions:  Your physician recommends that you continue on your current medications as directed. Please refer to the Current Medication list given to you today.  *If you need a refill on your cardiac medications before your next appointment, please call your pharmacy*  Lab Work: None If you have labs (blood work) drawn today and your tests are completely normal, you will receive your results only by: MyChart Message (if you have MyChart) OR A paper copy in the mail If you have any lab test that is abnormal or we need to change your treatment, we will call you to review the results.  Testing/Procedures: None  Follow-Up: At [facility], you and your health needs are our priority.  As part of our continuing mission to provide you with exceptional heart care, our providers are all part of one team.  This team includes your primary Cardiologist (physician) and Advanced Practice Providers or APPs (Physician Assistants and Nurse Practitioners) who all work together to provide you with the care you need, when you need it.  Your next appointment:   1 year(s)  Provider:   Luana Rumple, MD    We recommend signing up for the patient portal called "MyChart".  Sign up information is provided on this After Visit Summary.  MyChart is used to connect with patients for Virtual Visits (Telemedicine).  Patients are able to view lab/test results, encounter notes, upcoming appointments, etc.  Non-urgent messages can be sent to your provider as well.   To learn more about what you can do with MyChart, go to [email or web address].   Other Instructions None

## 2023-10-18 ENCOUNTER — Other Ambulatory Visit: Payer: Self-pay | Admitting: Student

## 2023-10-18 ENCOUNTER — Other Ambulatory Visit: Payer: Self-pay

## 2023-10-18 MED ORDER — ROSUVASTATIN CALCIUM 10 MG PO TABS: 10.0000 mg | ORAL_TABLET | Freq: Every day | ORAL | 3 refills | Status: AC

## 2023-11-15 ENCOUNTER — Telehealth: Payer: Self-pay | Admitting: Cardiovascular Disease

## 2023-11-15 NOTE — Telephone Encounter (Signed)
 Returned patient's call regarding questions about phone # for heart monitor company. Also forwarded patient's question about billing to billing dept.

## 2023-11-15 NOTE — Telephone Encounter (Signed)
 Pt requesting cb needing phone # to heart monitor company and copy of proof of payment for last ov copay

## 2023-11-23 ENCOUNTER — Telehealth: Payer: Self-pay

## 2023-11-23 NOTE — Telephone Encounter (Signed)
 Called pt and asked him to bring his CPAP Machine to his appointment tomorrow.

## 2023-11-24 ENCOUNTER — Ambulatory Visit: Payer: Medicare HMO | Admitting: Neurology

## 2023-11-24 ENCOUNTER — Encounter: Payer: Self-pay | Admitting: Neurology

## 2023-11-24 VITALS — BP 121/71 | HR 79 | Ht 71.0 in | Wt 302.5 lb

## 2023-11-24 DIAGNOSIS — G2581 Restless legs syndrome: Secondary | ICD-10-CM

## 2023-11-24 DIAGNOSIS — G4733 Obstructive sleep apnea (adult) (pediatric): Secondary | ICD-10-CM | POA: Diagnosis not present

## 2023-11-24 DIAGNOSIS — R6889 Other general symptoms and signs: Secondary | ICD-10-CM

## 2023-11-24 DIAGNOSIS — G4719 Other hypersomnia: Secondary | ICD-10-CM | POA: Diagnosis not present

## 2023-11-24 DIAGNOSIS — E1151 Type 2 diabetes mellitus with diabetic peripheral angiopathy without gangrene: Secondary | ICD-10-CM

## 2023-11-24 NOTE — Progress Notes (Signed)
 [facility]  PATIENT: Stephen Franklin. Stephen Franklin DOB: [birth year]  REFERRING DOCTOR OR PCP:  Monetta Angst, PA-C SOURCE: Patient, notes from [facility], downloads from the CPAP.  _________________________________   HISTORICAL  CHIEF COMPLAINT:  Chief Complaint  Patient presents with   RM10/CPAP    Pt is here Alone. Pt states that things have been pretty good with his CPAP Machine. Pt states that his last machine that was Recalled gave him some trouble with his breathing. ESS 2    HISTORY OF PRESENT ILLNESS:  Stephen Franklin is a 58 yo man with obstructive sleep apnea and memory difficulty  Update 11/24/2023 He has had knee surgery since the last visit (left and had right in past).  Also needs surgery at base of thumb.  He had an active dream 2 weeks ago and fell out of bed hitting the nightstand.   His neck hurts more.   He was placed on a steroid dosepak with benefit.   He has very vivid dreams once or twice a week since the beginning of the year.   He has only had the one dream where he acted out the dream - he felt he was walking.     He is on CPAP +15 cm.  He  is able to use it on a nightly basis for the whole night.  He feels better the next day when he uses CPAP for the entire night but he has .  The download shows excellent compliance (100% > 4 hrs and 100% night).  AHI = 4.2.   Armodafinil  is helping the sleepiness.    He has had a lot of congestion and felt he may have allergies.   He will be seeing pulmonology soon.  He tried Flonase without much benefit.  He is concerned if these symptoms might be related to the recall of his previous CPAP device.    He feels his memory is better, back to baseline.   He denies any excessive daytime sleepiness at this time.  He notes more sleepiness during the day and has reduced focus at times.      RLS is well controlled on ropinirole .   No side effects.  He also takes ropinirole  1 in am and 2 at night.    If  he skips or delays the dose, falling asleep is harder.   He has lost 25 pounds.    Weight is now stable.   He states it was HgBA1 was 6.2 most recently.  He tries to eat better.    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   He has neuropathy wit Numbness and pain is only in the toes/foot, right > left.   Pregabalin 75 mg helps some (takes 2 po bid).   He takes clonazepam  just 1-2 times a week and has not taken hydrocodone recently.       He has tried to lose weight but has had difficulty.  He is on Trulicity  EPWORTH SLEEPINESS SCALE  On a scale of 0 - 3 what is the chance of dozing:  Sitting and Reading:   0 Watching TV:    1 Sitting inactive in a public place: 0 Passenger in car for one hour: 0  Lying down to rest in the afternoon: 1 Sitting and talking to someone: 0 Sitting quietly after lunch:  1 In a car, stopped in traffic:  0  Total (out of 24):     3/24  Was 10/24 before CPAP  He is on disability.  He tries to stay physically and mentally fit.       Sleep history He was diagnosed with OSA in 2012 after presenting with snoring and EDS.   His first one was done at [facility] and he recalls an AHI = 24.   He was titrated to CPAP +14 cm.      REVIEW OF SYSTEMS: Constitutional: No fevers, chills, sweats, or change in appetite.   Mild insomnia/RLS better on CPAP Eyes: No visual changes, double vision, eye pain Ear, nose and throat: No hearing loss, ear pain, nasal congestion, sore throat Cardiovascular: No chest pain, palpitations Respiratory:  No shortness of breath at rest or with exertion.   Has congestion.   OSA and occ coughing GastrointestinaI: No nausea, vomiting, diarrhea, abdominal pain, fecal incontinence Genitourinary:  No dysuria, urinary retention or frequency.  No nocturia. Musculoskeletal:  has some LBP and myalgias Integumentary: No rash, pruritus, skin lesions Neurological: as above Psychiatric: No depression at this time.  No  anxiety Endocrine: No palpitations, diaphoresis, change in appetite, change in weigh or increased thirst Hematologic/Lymphatic:  No anemia, purpura, petechiae. Allergic/Immunologic: No itchy/runny eyes, nasal congestion, recent allergic reactions, rashes  ALLERGIES: Allergies  Allergen Reactions   Epinephrine  Shortness Of Breath    Shortness of breath and face swells   Naproxen Sodium Hives, Shortness Of Breath and Swelling   Bee Venom Itching    HOME MEDICATIONS:  Current Outpatient Medications:    albuterol  (VENTOLIN  HFA) 108 (90 Base) MCG/ACT inhaler, INHALE 2 PUFFS BY MOUTH EVERY 6 HOURS AS NEEDED FOR WHEEZING FOR SHORTNESS OF BREATH, Disp: 9 g, Rfl: 0   Armodafinil  200 MG TABS, Take 200 mg by mouth daily in the afternoon., Disp: , Rfl:    aspirin  EC 81 MG tablet, Take 81 mg by mouth daily. Swallow whole., Disp: , Rfl:    bisoprolol  (ZEBETA ) 5 MG tablet, Take 1 tablet by mouth once daily, Disp: 90 tablet, Rfl: 2   Blood Glucose Monitoring Suppl (TGT BLOOD GLUCOSE MONITORING) w/Device KIT, Inject into the skin daily in the afternoon., Disp: , Rfl:    buPROPion  (WELLBUTRIN  XL) 150 MG 24 hr tablet, Take 150 mg by mouth daily., Disp: , Rfl:    clonazePAM  (KLONOPIN ) 1 MG tablet, Take 1 mg by mouth 3 (three) times daily as needed (for anxiety)., Disp: , Rfl:    fenofibrate 160 MG tablet, Take 160 mg by mouth daily with breakfast. , Disp: , Rfl: 3   fluticasone -salmeterol (ADVAIR) 500-50 MCG/ACT AEPB, INHALE 1 DOSE BY MOUTH IN THE MORNING AND AT BEDTIME, Disp: 60 each, Rfl: 11   glucose blood (CONTOUR TEST) test strip, 1 each by Other route as needed., Disp: , Rfl:    JARDIANCE 10 MG TABS tablet, Take 1 tablet by mouth daily., Disp: , Rfl:    metFORMIN  (GLUCOPHAGE -XR) 500 MG 24 hr tablet, Take 1 tablet by mouth 2 (two) times daily., Disp: , Rfl:    methocarbamol  (ROBAXIN ) 500 MG tablet, Take 500 mg by mouth daily as needed for muscle spasms., Disp: , Rfl:    omega-3 acid ethyl esters  (LOVAZA ) 1 g capsule, Take 1 capsule (1 g total) by mouth 2 (two) times daily. Patient must schedule appointment for future refills second attempt, Disp: 30 capsule, Rfl: 0   omeprazole (PRILOSEC) 40 MG capsule, Take 40 mg by mouth daily before breakfast. , Disp: , Rfl:    pilocarpine (SALAGEN) 7.5 MG tablet,  Take 7.5 mg by mouth daily., Disp: , Rfl:    pregabalin (LYRICA) 75 MG capsule, Take 75 mg by mouth 2 (two) times daily. Two tablets twice daily, Disp: , Rfl:    rOPINIRole  (REQUIP ) 1 MG tablet, TAKE 1 TABLET BY MOUTH THREE TIMES DAILY ., Disp: 270 tablet, Rfl: 4   rosuvastatin  (CRESTOR ) 10 MG tablet, Take 1 tablet (10 mg total) by mouth daily., Disp: 90 tablet, Rfl: 3   sertraline  (ZOLOFT ) 100 MG tablet, Take 150 mg by mouth daily. , Disp: , Rfl:    testosterone  cypionate (DEPOTESTOSTERONE CYPIONATE) 200 MG/ML injection, Inject 1 mL into the muscle every 21 ( twenty-one) days. , Disp: , Rfl:    traMADol  (ULTRAM ) 50 MG tablet, TAKE 1 TABLET BY MOUTH AT BEDTIME AS NEEDED, Disp: 7 tablet, Rfl: 0   TRULICITY 1.5 MG/0.5ML SOAJ, Inject 1.5 mg into the skin every 7 (seven) days., Disp: , Rfl:    nitroGLYCERIN  (NITROSTAT ) 0.4 MG SL tablet, Place 1 tablet (0.4 mg total) under the tongue every 5 (five) minutes as needed for chest pain. (Patient not taking: Reported on 11/24/2023), Disp: 25 tablet, Rfl: 2  PAST MEDICAL HISTORY: Past Medical History:  Diagnosis Date   Anxiety    Diabetes mellitus without complication (HCC)    Type 2   Dysrhythmia    palpitations   GERD (gastroesophageal reflux disease)    High cholesterol    History of kidney stones    Hypothyroidism    hx of correcte no meds now   Sleep apnea    cpap    PAST SURGICAL HISTORY: Past Surgical History:  Procedure Laterality Date   COLONOSCOPY     HERNIA REPAIR     Left inguinal, umbilical with mesh 06-23-17 Dr. Elvan Hamel   KNEE SURGERY Left 09/2023   LEFT HEART CATH AND CORONARY ANGIOGRAPHY N/A 09/12/2018   Procedure: LEFT  HEART CATH AND CORONARY ANGIOGRAPHY;  Surgeon: Swaziland, Peter M, MD;  Location: [facility];  Service: Cardiovascular;  Laterality: N/A;   UMBILICAL HERNIA REPAIR N/A 06/23/2017   Procedure: LAPAROSCOPIC ASSISTED UMBILICAL HERNIA REPAIR WITH MESH;  Surgeon: Aldean Hummingbird, MD;  Location: [facility];  Service: General;  Laterality: N/A;    FAMILY HISTORY: Family History  Adopted: Yes  Problem Relation Age of Onset   Heart failure Mother    Breast cancer Mother    Diabetes Mother    Lung cancer Father     SOCIAL HISTORY:  Social History   Socioeconomic History   Marital status: Married    Spouse name: Not on file   Number of children: Not on file   Years of education: Not on file   Highest education level: Not on file  Occupational History   Not on file  Tobacco Use   Smoking status: Former    Current packs/day: 0.00    Average packs/day: 1 pack/day for 27.0 years (27.0 ttl pk-yrs)    Types: Cigarettes    Start date: 06/15/1983    Quit date: 06/21/2010    Years since quitting: 13.4   Smokeless tobacco: Former    Quit date: 06/13/2010  Vaping Use   Vaping status: Never Used  Substance and Sexual Activity   Alcohol use: No    Comment: hx of none in 7-8 years   Drug use: No   Sexual activity: Never  Other Topics Concern   Not on file  Social History Narrative   Not on file   Social Drivers of Health  Financial Resource Strain: Medium Risk (09/22/2023)   Received from [facility]   Overall Financial Resource Strain (CARDIA)    Difficulty of Paying Living Expenses: Somewhat hard  Food Insecurity: Low Risk  (10/05/2023)   Received from [facility]   Hunger Vital Sign    Worried About Running Out of Food in the Last Year: Never true    Ran Out of Food in the Last Year: Never true  Transportation Needs: No Transportation Needs (10/05/2023)   Received from [facility]    In the past 12 months, has lack of reliable transportation kept you from  medical appointments, meetings, work or from getting things needed for daily living? : No  Physical Activity: Insufficiently Active (09/22/2023)   Received from [facility]   Exercise Vital Sign    Days of Exercise per Week: 3 days    Minutes of Exercise per Session: 30 min  Stress: No Stress Concern Present (09/22/2023)   Received from [facility] - Occupational Stress Questionnaire    Feeling of Stress : Only a little  Social Connections: Socially Integrated (09/22/2023)   Received from [facility]   Social Network    How would you rate your social network (family, work, friends)?: Good participation with social networks  Intimate Partner Violence: Not At Risk (10/17/2023)   Received from [facility]   HITS    Over the last 12 months how often did your partner physically hurt you?: Never    Over the last 12 months how often did your partner insult you or talk down to you?: Never    Over the last 12 months how often did your partner threaten you with physical harm?: Never    Over the last 12 months how often did your partner scream or curse at you?: Never     PHYSICAL EXAM  Vitals:   11/24/23 1310  BP: 121/71  Pulse: 79  SpO2: 96%  Weight: (!) 302 lb 8 oz (137.2 kg)  Height: 5' 11 (1.803 m)    Body mass index is 42.19 kg/m.   General: The patient is an obese man in no acute distress   Neurologic Exam  Mental status: The patient is alert and oriented x 3 at the time of the examination.  Speech is normal.  He appears to have normal short-term memory and focus and attention.  Cranial nerves: Extraocular movements are full.  Facial strength and sensation was normal.  The trapezius strength is normal.   No obvious hearing deficits are noted.  Motor:  Muscle bulk is normal.   Tone is normal. Strength is  5 / 5 in all 4 extremities.   Sensory:  Reduced vibration sensation in ankles (75%) and toes (20%) and mild reduced pinprick  in toes/feet but normal at ankles.     Coordination: Cerebellar testing reveals good finger-nose-finger  Gait and station: Station is normal.  His gait is normal and tandem gait is minimally wide.  .  Reflexes: Deep tendon reflexes are symmetric and normal bilaterally.        Obstructive sleep apnea  Excessive daytime sleepiness  Type 2 diabetes mellitus with diabetic peripheral angiopathy without gangrene, without long-term current use of insulin  (HCC)  Restless leg syndrome  Vivid dream   1.   Continue CPAP at +15 cm H2O.   Supplies as needed.      2.   He will continue ropinirole  for the restless leg  syndromes and Lyrica for polyneuropathy and bilateral leg pain.   3.   He has vivid dreams and one episode of an active dream falling out of bed - if this worsens, consider clonazepam .  4.   He will return to see us  in 1 year or sooner if there are new or worsening neurologic symptoms.  This visit is part of a comprehensive longitudinal care medical relationship regarding the patients primary diagnosis of polyneuropathy and OSA and related concerns.   Celestino Ackerman A. Godwin Lat, MD, PhD, FAAN Certified in Neurology, Clinical Neurophysiology, Sleep Medicine, Pain Medicine and Neuroimaging Director, [facility]  [facility] [address], [address] [place], [place] [address] [phone]

## 2023-12-18 ENCOUNTER — Other Ambulatory Visit: Payer: Self-pay | Admitting: Neurology

## 2023-12-18 ENCOUNTER — Other Ambulatory Visit: Payer: Self-pay | Admitting: Cardiovascular Disease

## 2023-12-19 NOTE — Telephone Encounter (Signed)
 Last seen on 11/24/23 Follow up scheduled 11/29/24

## 2023-12-22 ENCOUNTER — Other Ambulatory Visit: Payer: Self-pay | Admitting: *Deleted

## 2023-12-22 ENCOUNTER — Telehealth: Payer: Self-pay | Admitting: Neurology

## 2023-12-22 DIAGNOSIS — G4719 Other hypersomnia: Secondary | ICD-10-CM

## 2023-12-22 DIAGNOSIS — G4733 Obstructive sleep apnea (adult) (pediatric): Secondary | ICD-10-CM

## 2023-12-22 NOTE — Telephone Encounter (Signed)
 Pt called back. Informed Pt that Nurse has sent message over to [facility] to have them pull order.pt is fine and will wait on callback .

## 2023-12-22 NOTE — Telephone Encounter (Signed)
 Called and LVM for pt to call. Phone room: please relay message below if he calls back:  I placed order for Transfer of care/cpap supplies. Sent community message to [facility] to have them pull order.

## 2023-12-22 NOTE — Telephone Encounter (Addendum)
 Pt called stating that due to his ins change he had to change his DME. Pt would like the provider to send in a order for his Cpap supplies to [facility]. Please advise.

## 2024-01-11 ENCOUNTER — Other Ambulatory Visit: Payer: Self-pay | Admitting: *Deleted

## 2024-01-11 NOTE — Telephone Encounter (Signed)
 Left detailed message on patient voicemail ( per DPR access) that supply order was sent to [facility] and he may call them directly to get an update. Number left on voicemail for [facility].

## 2024-01-11 NOTE — Telephone Encounter (Signed)
 Pt called wanting to know if his Rx for his cpap supplies have been sent in. Pt states he is on his last mask. Please advise.

## 2024-01-16 ENCOUNTER — Telehealth: Payer: Self-pay | Admitting: Neurology

## 2024-01-16 NOTE — Telephone Encounter (Signed)
 LVM for pt letting him know order updated and [facility] notified of updated order. Asked him to call back if he has any further questions.

## 2024-01-16 NOTE — Telephone Encounter (Signed)
 Patient said [facility] requesting for insurance purposes an order with specified detail of each items for CPAP.

## 2024-01-16 NOTE — Telephone Encounter (Signed)
 Last seen 11/24/23 and next f/u 11/29/24. Order for all CPAP supplies placed 12/22/23 (see below) Sent community message to [facility] to see specifically what is needed, waiting on response

## 2024-01-16 NOTE — Telephone Encounter (Signed)
   I updated pt order and sent community message back to [place] letting him know.

## 2024-01-16 NOTE — Addendum Note (Signed)
 Addended by: JOSHUA MAURILIO CROME on: 01/16/2024 04:40 PM   Modules accepted: Orders

## 2024-01-17 NOTE — Telephone Encounter (Signed)
 Pt called in and the message from [place], [place] was relayed.

## 2024-01-17 NOTE — Telephone Encounter (Signed)
 Please call pt back. We handled this and Stephen Franklin/[facility] confirmed he got corrected order today (see below). They need time to process. I recommend he f/u with them tomorrow.

## 2024-01-17 NOTE — Telephone Encounter (Addendum)
 Patient said spoke with [facility]; informed have not received anything from [facility]. [facility] need specifics on each order.

## 2024-01-18 NOTE — Telephone Encounter (Signed)
 Patient said [facility] will not give CPAP supplies because item number is not on prescription for each specific item.

## 2024-01-18 NOTE — Telephone Encounter (Signed)
 Sent community message to Brad/[facility] asking if something else is needed for order. Waiting on response.

## 2024-01-19 NOTE — Telephone Encounter (Signed)
 Called [facility] at [phone] [facility]. Spoke w/ rep. Pt established with [place] location.She placed me on hold and spoke w/ RT. They are both confused on what is needed as well.    Asked me to call Shona who was working on order. Phone: [phone]. I called. She states Medicare now needs everything listed out in comments. Should list: Full face mask or nasal mask, standard tubing, water chamber for CPAP, nasal cushion and nasal pillows, CPAP mask headgear, filters for CPAP. I updated order and sent community message back to [facility] that order updated for them to pull.

## 2024-01-19 NOTE — Addendum Note (Signed)
 Addended by: JOSHUA MAURILIO CROME on: 01/19/2024 10:42 AM   Modules accepted: Orders

## 2024-02-16 ENCOUNTER — Telehealth: Payer: Self-pay | Admitting: *Deleted

## 2024-02-16 NOTE — Telephone Encounter (Signed)
 R/c pt sleep study from [facility] medical center. Report in the pod

## 2024-02-17 ENCOUNTER — Telehealth: Payer: Self-pay | Admitting: Neurology

## 2024-02-17 NOTE — Telephone Encounter (Signed)
 We received the sleep study from 07/16/2011 [facility]) it was a CPAP titration showing the best control occurred at CPAP +14 cm pressure.  On this setting, AHI was 1.1 including supine REM sleep

## 2024-02-22 NOTE — Telephone Encounter (Signed)
 Stephen Franklin

## 2024-05-23 ENCOUNTER — Telehealth: Payer: Self-pay | Admitting: Neurology

## 2024-05-23 NOTE — Telephone Encounter (Signed)
 Pt called to request to speak to Md about Pt Cpap Machine. Pt stated  that  He believed he need his Pressure for Cpap Machine increased . Put Pt is having a hard time sleeping with Machine and wanted to speak to MD  about what he should do .

## 2024-05-24 NOTE — Telephone Encounter (Signed)
 Pt called in and stated that he has excessive daytime sleepiness an thinks his pressure needs to be turned up because of that. He stated that his wife has noticed as well. Cpap report below

## 2024-05-24 NOTE — Telephone Encounter (Signed)
 Lvm 1st attempt by hf 05/24/24 Imported data from cpap

## 2024-05-28 ENCOUNTER — Other Ambulatory Visit: Payer: Self-pay | Admitting: Neurology

## 2024-05-28 DIAGNOSIS — G4733 Obstructive sleep apnea (adult) (pediatric): Secondary | ICD-10-CM

## 2024-05-28 NOTE — Telephone Encounter (Signed)
 Pt has returned call to [facility]

## 2024-05-28 NOTE — Telephone Encounter (Signed)
 Cpap order change sent to dme:  DME [facility]  [phone]

## 2024-05-28 NOTE — Telephone Encounter (Signed)
 Lvm 1st attempt by hf 05/28/24

## 2024-05-29 NOTE — Telephone Encounter (Signed)
 Relayed message to patient, patient verbalized understand.

## 2024-05-29 NOTE — Telephone Encounter (Signed)
 If pt returns call please inform new order for change in cpap machine has been sent to dme

## 2024-05-29 NOTE — Telephone Encounter (Signed)
 Lvm 1st attempt by hf 05/29/24

## 2024-06-18 NOTE — Progress Notes (Signed)
 "  Cardiology Office Note    Date:  06/22/2024  ID:  Stephen Franklin 1966/06/06, MRN [id number] PCP:  Stephen Jon BROCKS, NP  Cardiologist:  Stephen Balding, MD  Electrophysiologist:  None   Chief Complaint: Shortness of breath  History of Present Illness: .    Stephen Franklin is a 59 y.o. male with visit-pertinent history of CAD, OSA, hypothyroidism, DM 2, palpitations, PVCs, ED, RLS.   11/10/2022 monitor average heart rate 84 bpm, predominant rhythm was sinus, first-degree AV block was present, VE's were rare at less than 1% 11/04/2022 ETT no ST deviation was noted, occasional PVCs, ECG was nondiagnostic secondary to failure to achieve target heart rate 10/28/2021 echo EF 55 to 60%, trivial MR, mild dilatation of the aortic root at 38 mm 11/09/2018 monitor predominant rhythm was sinus, frequent PVCs at 13% 09/12/2018 cardiac cath nonobstructive CAD, first diagonal lesion was 45% stenosed  Patient established care with Dr. Balding in 2014 for evaluation of shortness of breath and palpitations.  Patient developed chest pain in 2020, underwent cardiac catheterization which revealed nonobstructive CAD.  In 10/2022 he was bothered by increased frequency of palpitations, monitor revealed PVCs and no changes were made to plan of care.  He underwent ETT which was nondiagnostic secondary inability to achieve target heart rate but was overall unchanged from prior ETT.  Patient was last seen in clinic on 10/06/2023 by Stephen Hoover, NP for preoperative cardiac evaluation.  He had remained stable from cardiac standpoint.  Today he presents regarding episodes of left neck pain and shortness of breath.  Patient notes that in recent months he has been having some progressive fatigue and shortness of breath.  He reports that in early 2025 he underwent knee surgery and since has been less active.  He notes that when climbing stairs he will become short of breath and is unable to complete as much activity as  he previously was.  He reports that about 2 weeks ago he experienced left neck pain that was intermittent for a few days, then last week experienced left neck pain that radiated into the top of his left arm, denied any chest pain or association with exertion.  Patient notes this pain was sharp followed by a slight burning sensation that lasted 10 to 20 seconds then resolved spontaneously.  He denies any recurrence in the last week. ROS: .   Today he denies lower extremity edema, melena, hematuria, hemoptysis, diaphoresis, weakness, presyncope, syncope, orthopnea, and PND.  All other systems are reviewed and otherwise negative. Studies Reviewed: Stephen Franklin   EKG:  EKG is ordered today, personally reviewed, demonstrating  EKG Interpretation Date/Time:  Friday June 22 2024 11:31:28 EST Ventricular Rate:  78 PR Interval:  184 QRS Duration:  86 QT Interval:  380 QTC Calculation: 433 R Axis:   49  Text Interpretation: Normal sinus rhythm Normal ECG When compared with ECG of 06-Oct-2023 11:04, No significant change was found Confirmed by Stephen Franklin [phone]) on 06/22/2024 11:34:23 AM   CV Studies: Cardiac studies reviewed are outlined and summarized above. Otherwise please see EMR for full report. Cardiac Studies & Procedures   ______________________________________________________________________________________________ CARDIAC CATHETERIZATION  CARDIAC CATHETERIZATION 09/12/2018  Conclusion  Ost 1st Diag to 1st Diag lesion is 45% stenosed.  The left ventricular systolic function is normal.  LV end diastolic pressure is normal.  The left ventricular ejection fraction is 55-65% by visual estimate.  1. Nonobstructive CAD- mild 2. Normal LV function 3. Normal LVEDP  Plan: continue  risk factor modification.  Findings Coronary Findings Diagnostic  Dominance: Right  Left Main Vessel was injected. Vessel is normal in caliber. Vessel is angiographically normal.  Left Anterior Descending Vessel  was injected. Vessel is normal in caliber. Vessel is angiographically normal.  First Diagonal Branch Vessel is small in size. Ost 1st Diag to 1st Diag lesion is 45% stenosed.  Ramus Intermedius Vessel was injected. Vessel is normal in caliber. Vessel is angiographically normal.  Left Circumflex Vessel was injected. Vessel is small. Vessel is angiographically normal.  Right Coronary Artery Vessel was injected. Vessel is large. The vessel exhibits minimal luminal irregularities.  Intervention  No interventions have been documented.   STRESS TESTS  EXERCISE TOLERANCE TEST (ETT) 11/04/2022  Interpretation Summary   No ST deviation was noted. Arrhythmias during stress: occasional PVCs. The ECG was not diagnostic due to failure to achieve 85% MAPHR.   Prior study available for comparison from 12/28/2016. No changes compared to prior study.   ECHOCARDIOGRAM  ECHOCARDIOGRAM COMPLETE 10/28/2021  Narrative ECHOCARDIOGRAM REPORT    Patient Name:   Stephen Franklin Date of Exam: 10/28/2021 Medical Rec #:  [id number]         Height:       72.0 in Accession #:    [id number]        Weight:       292.4 lb Date of Birth:  Apr 14, 1966        BSA:          2.504 m Patient Age:    55 years          BP:           128/80 mmHg Patient Gender: M                 HR:           75 bpm. Exam Location:  [address]  Procedure: 2D Echo, Cardiac Doppler and Color Doppler  Indications:    R06.00 SOB  History:        Patient has prior history of Echocardiogram examinations, most recent 01/25/2013. Signs/Symptoms:Shortness of Breath; Risk Factors:Former Smoker, Dyslipidemia and Obesity.  Sonographer:    Stephen Franklin RDCS Referring Phys: [phone] Stephen Franklin   Sonographer Comments: Patient is morbidly obese. IMPRESSIONS   1. Left ventricular ejection fraction, by estimation, is 55 to 60%. The left ventricle has normal function. The left ventricle has no regional wall motion abnormalities.  Left ventricular diastolic parameters were normal. 2. Right ventricular systolic function is normal. The right ventricular size is normal. Tricuspid regurgitation signal is inadequate for assessing PA pressure. 3. The mitral valve is normal in structure. Trivial mitral valve regurgitation. No evidence of mitral stenosis. 4. The aortic valve is normal in structure. Aortic valve regurgitation is not visualized. No aortic stenosis is present. 5. There is mild dilatation of the aortic root, measuring 38 mm. 6. The inferior vena cava is normal in size with greater than 50% respiratory variability, suggesting right atrial pressure of 3 mmHg.  FINDINGS Left Ventricle: Left ventricular ejection fraction, by estimation, is 55 to 60%. The left ventricle has normal function. The left ventricle has no regional wall motion abnormalities. The left ventricular internal cavity size was normal in size. There is no left ventricular hypertrophy. Left ventricular diastolic parameters were normal. Normal left ventricular filling pressure.  Right Ventricle: The right ventricular size is normal. No increase in right ventricular wall thickness. Right ventricular systolic function is normal. Tricuspid regurgitation signal is  inadequate for assessing PA pressure.  Left Atrium: Left atrial size was normal in size.  Right Atrium: Right atrial size was normal in size.  Pericardium: There is no evidence of pericardial effusion.  Mitral Valve: The mitral valve is normal in structure. Trivial mitral valve regurgitation. No evidence of mitral valve stenosis.  Tricuspid Valve: The tricuspid valve is normal in structure. Tricuspid valve regurgitation is not demonstrated. No evidence of tricuspid stenosis.  Aortic Valve: The aortic valve is normal in structure. Aortic valve regurgitation is not visualized. No aortic stenosis is present.  Pulmonic Valve: The pulmonic valve was normal in structure. Pulmonic valve regurgitation  is trivial. No evidence of pulmonic stenosis.  Aorta: The aortic root is normal in size and structure. There is mild dilatation of the aortic root, measuring 38 mm.  Venous: The inferior vena cava is normal in size with greater than 50% respiratory variability, suggesting right atrial pressure of 3 mmHg.  IAS/Shunts: No atrial level shunt detected by color flow Doppler.   LEFT VENTRICLE PLAX 2D LVIDd:         4.90 cm   Diastology LVIDs:         3.40 cm   LV e' medial:    8.70 cm/s LV PW:         0.90 cm   LV E/e' medial:  10.1 LV IVS:        1.00 cm   LV e' lateral:   11.30 cm/s LVOT diam:     2.40 cm   LV E/e' lateral: 7.8 LV SV:         84 LV SV Index:   34 LVOT Area:     4.52 cm   RIGHT VENTRICLE             IVC RV S prime:     14.80 cm/s  IVC diam: 1.30 cm TAPSE (M-mode): 2.3 cm  LEFT ATRIUM           Index        RIGHT ATRIUM           Index LA diam:      4.70 cm 1.88 cm/m   RA Pressure: 3.00 mmHg LA Vol (A2C): 46.5 ml 18.57 ml/m  RA Area:     17.00 cm LA Vol (A4C): 77.6 ml 30.99 ml/m  RA Volume:   45.60 ml  18.21 ml/m AORTIC VALVE LVOT Vmax:   88.60 cm/s LVOT Vmean:  64.000 cm/s LVOT VTI:    0.186 m  AORTA Ao Root diam: 3.80 cm Ao Asc diam:  3.20 cm  MITRAL VALVE               TRICUSPID VALVE MV Area (PHT): 3.95 cm    Estimated RAP:  3.00 mmHg MV Decel Time: 192 msec MV E velocity: 88.00 cm/s  SHUNTS MV A velocity: 73.40 cm/s  Systemic VTI:  0.19 m MV E/A ratio:  1.20        Systemic Diam: 2.40 cm  Wilbert Bihari MD Electronically signed by Wilbert Bihari MD Signature Date/Time: 10/28/2021/8:10:36 AM    Final    MONITORS  LONG TERM MONITOR (3-14 DAYS) 11/10/2022  Narrative   The dominant rhythm is normal sinus rhythm with normal circadian variation.  First-degree AV block is present.  There is some degree of heart rate blunting likely related to chronic beta-blocker use.   There are very rare atrial premature beats and there is no evidence of complex  atrial arrhythmia or  atrial fibrillation.   There are very rare premature ventricular contractions although these do occur at times in a pattern of bigeminy or trigeminy.   There is no evidence of severe bradycardia, pauses or high-grade AV blocks.   There is good correlation with me in the patient's symptoms and the current set of premature ventricular contractions.  Mild abnormalities on arrhythmia monitor. The patient's symptoms correlate with rare premature ventricular contractions. There is evidence of mild beta-blocker related chronotropic incompetence.  Patch Wear Time:  1 days and 12 hours (2024-05-20T16:35:03-399 to 2024-05-22T04:53:25-0400)  Patient had a min HR of 63 bpm, max HR of 104 bpm, and avg HR of 84 bpm. Predominant underlying rhythm was Sinus Rhythm. Slight P wave morphology changes were noted. First Degree AV Block was present. Isolated SVEs were rare (<1.0%), and no SVE Couplets or SVE Triplets were present. Isolated VEs were rare (<1.0%, 481), VE Triplets were rare (<1.0%, 1), and no VE Couplets were present. Ventricular Bigeminy and Trigeminy were present. Inverted QRS complexes possibly due to inverted placement of device.       ______________________________________________________________________________________________       Current Reported Medications:.    Active Medications[1]  Physical Exam:    VS:  BP 118/76   Pulse 79   Ht 5' 11 (1.803 m)   Wt 296 lb 9.6 oz (134.5 kg)   SpO2 98%   BMI 41.37 kg/m    Wt Readings from Last 3 Encounters:  06/22/24 296 lb 9.6 oz (134.5 kg)  11/24/23 (!) 302 lb 8 oz (137.2 kg)  10/06/23 298 lb (135.2 kg)    GEN: Well nourished, well developed in no acute distress NECK: No JVD; No carotid bruits CARDIAC: RRR, no murmurs, rubs, gallops RESPIRATORY:  Clear to auscultation without rales, wheezing or rhonchi  ABDOMEN: Soft, non-tender, non-distended EXTREMITIES:  No edema; No acute deformity     Asessement and  Plan:.    CAD: Patient with nonobstructive CAD on LHC in 2020.  Patient has noted progressive fatigue and shortness of breath following surgery last year, notes decreased activity tolerance.  He also reports intermittent episodes of left neck pain and left neck pain with radiation to the left shoulder, denies any significant chest pain associated.  Pain described as sharp followed by a slight burning sensation that resolved after 20 seconds, overall pain is atypical however given history of nonobstructive CAD on LHC in 2020, increased fatigue and decreased activity tolerance will proceed with coronary CTA and echocardiogram, patient in agreement with plan.  Patient will take metoprolol  tartrate 50 mg prior to CTA, discussed use of nitroglycerin  during testing.  Reviewed ED precautions. Continue aspirin  81 mg daily, bisoprolol  5 mg daily, fenofibrate 160 mg daily, Lovaza , Crestor  10 mg daily. Check CBC and BMET today.   Hypertension: Blood pressure today 118/76.  Continue current antihypertensive regimen.  Palpitations: Patient reports palpitations occur possibly twice a week, notes these have improved however he is able to easy feel when they occur.  Continue bisoprolol .   Hyperlipidemia: Last profile in 09/12/2023 indicated total cholesterol 102, HDL 26 and LDL 47.  Continue Crestor , fenofibrate.   OSA: Patient reports CPAP adherence.   Disposition: F/u with Dr. Francyne on 08/08/24 as scheduled.   Signed, Isabel Ardila D Brinkley Peet, NP       [1]  Current Meds  Medication Sig   albuterol  (VENTOLIN  HFA) 108 (90 Base) MCG/ACT inhaler INHALE 2 PUFFS BY MOUTH EVERY 6 HOURS AS NEEDED FOR WHEEZING FOR SHORTNESS OF BREATH  Armodafinil  200 MG TABS Take 200 mg by mouth daily in the afternoon.   aspirin  EC 81 MG tablet Take 81 mg by mouth daily. Swallow whole.   bisoprolol  (ZEBETA ) 5 MG tablet Take 1 tablet by mouth once daily   Blood Glucose Monitoring Suppl (TGT BLOOD GLUCOSE MONITORING) w/Device KIT Inject  into the skin daily in the afternoon.   buPROPion  (WELLBUTRIN  XL) 150 MG 24 hr tablet Take 150 mg by mouth daily.   clonazePAM  (KLONOPIN ) 1 MG tablet Take 1 mg by mouth 3 (three) times daily as needed (for anxiety).   fenofibrate 160 MG tablet Take 160 mg by mouth daily with breakfast.    fluticasone -salmeterol (ADVAIR) 500-50 MCG/ACT AEPB INHALE 1 DOSE BY MOUTH IN THE MORNING AND AT BEDTIME   glucose blood (CONTOUR TEST) test strip 1 each by Other route as needed.   JARDIANCE 10 MG TABS tablet Take 1 tablet by mouth daily.   metFORMIN  (GLUCOPHAGE -XR) 500 MG 24 hr tablet Take 1 tablet by mouth 2 (two) times daily.   methocarbamol  (ROBAXIN ) 500 MG tablet Take 500 mg by mouth daily as needed for muscle spasms.   metoprolol  tartrate (LOPRESSOR ) 50 MG tablet Take 1 tablet (50 mg total) by mouth once. Take 90-120 minutes prior to scan. Hold for SBP less than 110.   nitroGLYCERIN  (NITROSTAT ) 0.4 MG SL tablet Place 1 tablet (0.4 mg total) under the tongue every 5 (five) minutes as needed for chest pain.   omega-3 acid ethyl esters (LOVAZA ) 1 g capsule Take 1 capsule (1 g total) by mouth 2 (two) times daily. Patient must schedule appointment for future refills second attempt   omeprazole (PRILOSEC) 40 MG capsule Take 40 mg by mouth daily before breakfast.    pilocarpine (SALAGEN) 7.5 MG tablet Take 7.5 mg by mouth daily.   pregabalin (LYRICA) 75 MG capsule Take 75 mg by mouth 2 (two) times daily. Two tablets twice daily   rOPINIRole  (REQUIP ) 1 MG tablet TAKE 1 TABLET BY MOUTH THREE TIMES DAILY   rosuvastatin  (CRESTOR ) 10 MG tablet Take 1 tablet (10 mg total) by mouth daily.   sertraline  (ZOLOFT ) 100 MG tablet Take 150 mg by mouth daily.    testosterone  cypionate (DEPOTESTOSTERONE CYPIONATE) 200 MG/ML injection Inject 1 mL into the muscle every 21 ( twenty-one) days.    traMADol  (ULTRAM ) 50 MG tablet TAKE 1 TABLET BY MOUTH AT BEDTIME AS NEEDED   TRULICITY 1.5 MG/0.5ML SOAJ Inject 1.5 mg into the skin  every 7 (seven) days.   "

## 2024-06-22 ENCOUNTER — Encounter: Payer: Self-pay | Admitting: Cardiology

## 2024-06-22 ENCOUNTER — Ambulatory Visit: Payer: Medicare (Managed Care) | Attending: Cardiology | Admitting: Cardiology

## 2024-06-22 VITALS — BP 118/76 | HR 79 | Ht 71.0 in | Wt 296.6 lb

## 2024-06-22 DIAGNOSIS — R002 Palpitations: Secondary | ICD-10-CM

## 2024-06-22 DIAGNOSIS — R0602 Shortness of breath: Secondary | ICD-10-CM | POA: Diagnosis not present

## 2024-06-22 DIAGNOSIS — G4733 Obstructive sleep apnea (adult) (pediatric): Secondary | ICD-10-CM | POA: Diagnosis not present

## 2024-06-22 DIAGNOSIS — Z79899 Other long term (current) drug therapy: Secondary | ICD-10-CM

## 2024-06-22 DIAGNOSIS — I2510 Atherosclerotic heart disease of native coronary artery without angina pectoris: Secondary | ICD-10-CM

## 2024-06-22 DIAGNOSIS — I10 Essential (primary) hypertension: Secondary | ICD-10-CM

## 2024-06-22 DIAGNOSIS — R072 Precordial pain: Secondary | ICD-10-CM | POA: Diagnosis not present

## 2024-06-22 LAB — BASIC METABOLIC PANEL WITH GFR
BUN/Creatinine Ratio: 13 (ref 10–24)
BUN: 16 mg/dL (ref 8–27)
CO2: 22 mmol/L (ref 20–29)
Calcium: 9.3 mg/dL (ref 8.6–10.2)
Chloride: 104 mmol/L (ref 96–106)
Creatinine, Ser: 1.27 mg/dL (ref 0.76–1.27)
Glucose: 120 mg/dL — ABNORMAL HIGH (ref 70–99)
Potassium: 4.5 mmol/L (ref 3.5–5.2)
Sodium: 139 mmol/L (ref 134–144)
eGFR: 65 mL/min/1.73

## 2024-06-22 LAB — CBC
Hematocrit: 47.1 % (ref 37.5–51.0)
Hemoglobin: 15.5 g/dL (ref 13.0–17.7)
MCH: 31.5 pg (ref 26.6–33.0)
MCHC: 32.9 g/dL (ref 31.5–35.7)
MCV: 96 fL (ref 79–97)
Platelets: 171 x10E3/uL (ref 150–450)
RBC: 4.92 x10E6/uL (ref 4.14–5.80)
RDW: 12.8 % (ref 11.6–15.4)
WBC: 4.7 x10E3/uL (ref 3.4–10.8)

## 2024-06-22 MED ORDER — METOPROLOL TARTRATE 50 MG PO TABS: 50.0000 mg | ORAL_TABLET | Freq: Once | ORAL | 0 refills | Status: AC

## 2024-06-22 NOTE — Patient Instructions (Addendum)
 Medication Instructions:  Your physician recommends that you continue on your current medications as directed. Please refer to the Current Medication list given to you today.   HOLD: Bisoprolol  day of Coronary CTA  *If you need a refill on your cardiac medications before your next appointment, please call your pharmacy*  Lab Work: TODAY: BMET, CBC  If you have labs (blood work) drawn today and your tests are completely normal, you will receive your results only by: MyChart Message (if you have MyChart) OR A paper copy in the mail If you have any lab test that is abnormal or we need to change your treatment, we will call you to review the results.  Testing/Procedures: Your physician has requested that you have an echocardiogram. Echocardiography is a painless test that uses sound waves to create images of your heart. It provides your doctor with information about the size and shape of your heart and how well your hearts chambers and valves are working. This procedure takes approximately one hour. There are no restrictions for this procedure. Please do NOT wear cologne, perfume, aftershave, or lotions (deodorant is allowed). Please arrive 15 minutes prior to your appointment time.  Please note: We ask at that you not bring children with you during ultrasound (echo/ vascular) testing. Due to room size and safety concerns, children are not allowed in the ultrasound rooms during exams. Our front office staff cannot provide observation of children in our lobby area while testing is being conducted. An adult accompanying a patient to their appointment will only be allowed in the ultrasound room at the discretion of the ultrasound technician under special circumstances. We apologize for any inconvenience.   Follow-Up: At [facility], you and your health needs are our priority.  As part of our continuing mission to provide you with exceptional heart care, our providers are all part of one  team.  This team includes your primary Cardiologist (physician) and Advanced Practice Providers or APPs (Physician Assistants and Nurse Practitioners) who all work together to provide you with the care you need, when you need it.  Your next appointment:    Keep follow in February 2026    We recommend signing up for the patient portal called MyChart.  Sign up information is provided on this After Visit Summary.  MyChart is used to connect with patients for Virtual Visits (Telemedicine).  Patients are able to view lab/test results, encounter notes, upcoming appointments, etc.  Non-urgent messages can be sent to your provider as well.   To learn more about what you can do with MyChart, go to [email or web address].   Other Instructions   Your cardiac CT will be scheduled at one of the below locations:    [facility] [address]  [place], [place] [address]   If scheduled at the [facility] at [address], please enter the parking lot using the [address] entrance and use the FREE valet service at the patient drop-off area. Enter the building and check-in with registration on the main floor.   Please follow these instructions carefully (unless otherwise directed):  An IV will be required for this test and Nitroglycerin  will be given.  Hold all erectile dysfunction medications at least 3 days (72 hrs) prior to test. (Ie viagra , cialis, sildenafil , tadalafil, etc)   On the Night Before the Test: Be sure to Drink plenty of water. Do not consume any caffeinated/decaffeinated beverages or chocolate 12 hours prior to your test. Do  not take any antihistamines 12 hours prior to your test.    On the Day of the Test: Drink plenty of water until 1 hour prior to the test. Do not eat any food 1 hour prior to test. You may take your regular medications prior to the test.  Take metoprolol  (Lopressor ) two hours prior to test. HOLD:  Bisoprolol  day of this procedure. If you take Furosemide/Hydrochlorothiazide/Spironolactone/Chlorthalidone, please HOLD on the morning of the test. Patients who wear a continuous glucose monitor MUST remove the device prior to scanning.        After the Test: Drink plenty of water. After receiving IV contrast, you may experience a mild flushed feeling. This is normal. On occasion, you may experience a mild rash up to 24 hours after the test. This is not dangerous. If this occurs, you can take Benadryl 25 mg, Zyrtec, Claritin, or Allegra and increase your fluid intake. (Patients taking Tikosyn should avoid Benadryl, and may take Zyrtec, Claritin, or Allegra) If you experience trouble breathing, this can be serious. If it is severe call [phone] IMMEDIATELY. If it is mild, please call our office.  We will call to schedule your test 2-4 weeks out understanding that some insurance companies will need an authorization prior to the service being performed.   For more information and frequently asked questions, please visit our website : [email or web address]  For non-scheduling related questions, please contact the cardiac imaging nurse navigator should you have any questions/concerns: Cardiac Imaging Nurse Navigators Direct Office Dial: [phone]   For scheduling needs, including cancellations and rescheduling, please call Brittany, [phone].

## 2024-06-24 ENCOUNTER — Ambulatory Visit: Payer: Self-pay | Admitting: Cardiology

## 2024-06-29 ENCOUNTER — Encounter (HOSPITAL_COMMUNITY): Payer: Self-pay

## 2024-07-02 ENCOUNTER — Telehealth (HOSPITAL_COMMUNITY): Payer: Self-pay | Admitting: *Deleted

## 2024-07-02 NOTE — Telephone Encounter (Signed)
 Attempted to call patient regarding upcoming cardiac CT appointment. Left message on voicemail with name and callback number Sid Seats RN Navigator Cardiac Imaging [facility] [phone] Office

## 2024-07-03 ENCOUNTER — Ambulatory Visit (HOSPITAL_BASED_OUTPATIENT_CLINIC_OR_DEPARTMENT_OTHER)
Admission: RE | Admit: 2024-07-03 | Discharge: 2024-07-03 | Disposition: A | Discharge status: Home / Self Care | Payer: Medicare (Managed Care) | Source: Ambulatory Visit | Attending: Cardiology | Admitting: Cardiology

## 2024-07-03 ENCOUNTER — Encounter (HOSPITAL_BASED_OUTPATIENT_CLINIC_OR_DEPARTMENT_OTHER): Payer: Self-pay

## 2024-07-03 DIAGNOSIS — R931 Abnormal findings on diagnostic imaging of heart and coronary circulation: Secondary | ICD-10-CM | POA: Diagnosis present

## 2024-07-03 DIAGNOSIS — R002 Palpitations: Secondary | ICD-10-CM | POA: Diagnosis present

## 2024-07-03 DIAGNOSIS — I2510 Atherosclerotic heart disease of native coronary artery without angina pectoris: Secondary | ICD-10-CM | POA: Insufficient documentation

## 2024-07-03 DIAGNOSIS — R0602 Shortness of breath: Secondary | ICD-10-CM | POA: Insufficient documentation

## 2024-07-03 MED ORDER — METOPROLOL TARTRATE 5 MG/5ML IV SOLN: 10.0000 mg | Freq: Once | INTRAVENOUS | Status: DC | PRN

## 2024-07-03 MED ORDER — DILTIAZEM HCL 25 MG/5ML IV SOLN: 10.0000 mg | INTRAVENOUS | Status: DC | PRN

## 2024-07-03 MED ORDER — IOHEXOL 350 MG/ML SOLN
100.0000 mL | Freq: Once | INTRAVENOUS | Status: AC | PRN
  Administered 2024-07-03: 110 mL via INTRAVENOUS

## 2024-07-03 MED ORDER — NITROGLYCERIN 0.4 MG SL SUBL
0.8000 mg | SUBLINGUAL_TABLET | Freq: Once | SUBLINGUAL | Status: AC
  Administered 2024-07-03: 0.8 mg via SUBLINGUAL

## 2024-07-04 ENCOUNTER — Ambulatory Visit (HOSPITAL_BASED_OUTPATIENT_CLINIC_OR_DEPARTMENT_OTHER)
Admission: RE | Admit: 2024-07-04 | Discharge: 2024-07-04 | Disposition: A | Payer: Medicare (Managed Care) | Source: Ambulatory Visit | Attending: Cardiology | Admitting: Cardiology

## 2024-07-04 ENCOUNTER — Other Ambulatory Visit: Payer: Self-pay | Admitting: Cardiology

## 2024-07-04 ENCOUNTER — Encounter: Payer: Self-pay | Admitting: Cardiovascular Disease

## 2024-07-04 DIAGNOSIS — R931 Abnormal findings on diagnostic imaging of heart and coronary circulation: Secondary | ICD-10-CM

## 2024-07-04 NOTE — Progress Notes (Signed)
FFR Orders 

## 2024-07-10 ENCOUNTER — Ambulatory Visit: Payer: Self-pay | Admitting: Cardiology

## 2024-07-25 ENCOUNTER — Ambulatory Visit (HOSPITAL_COMMUNITY): Payer: Medicare (Managed Care)

## 2024-08-08 ENCOUNTER — Ambulatory Visit: Payer: Self-pay | Admitting: Cardiovascular Disease

## 2024-11-29 ENCOUNTER — Ambulatory Visit: Admitting: Neurology
